# Patient Record
Sex: Female | Born: 1970 | Race: White | Hispanic: No | Marital: Married | Smoking: Former smoker
Health system: Southern US, Community
[De-identification: ages and names within clinical notes are randomized; demographics above are authoritative.]

## PROBLEM LIST (undated history)

## (undated) DIAGNOSIS — E739 Lactose intolerance, unspecified: Secondary | ICD-10-CM

## (undated) DIAGNOSIS — T7840XA Allergy, unspecified, initial encounter: Secondary | ICD-10-CM

## (undated) DIAGNOSIS — F32A Depression, unspecified: Secondary | ICD-10-CM

## (undated) DIAGNOSIS — G43909 Migraine, unspecified, not intractable, without status migrainosus: Secondary | ICD-10-CM

## (undated) DIAGNOSIS — Z9889 Other specified postprocedural states: Secondary | ICD-10-CM

## (undated) DIAGNOSIS — R002 Palpitations: Secondary | ICD-10-CM

## (undated) DIAGNOSIS — M549 Dorsalgia, unspecified: Secondary | ICD-10-CM

## (undated) DIAGNOSIS — N979 Female infertility, unspecified: Secondary | ICD-10-CM

## (undated) DIAGNOSIS — E282 Polycystic ovarian syndrome: Secondary | ICD-10-CM

## (undated) DIAGNOSIS — F419 Anxiety disorder, unspecified: Secondary | ICD-10-CM

## (undated) DIAGNOSIS — F329 Major depressive disorder, single episode, unspecified: Secondary | ICD-10-CM

## (undated) DIAGNOSIS — K219 Gastro-esophageal reflux disease without esophagitis: Secondary | ICD-10-CM

## (undated) HISTORY — DX: Anxiety disorder, unspecified: F41.9

## (undated) HISTORY — DX: Major depressive disorder, single episode, unspecified: F32.9

## (undated) HISTORY — DX: Migraine, unspecified, not intractable, without status migrainosus: G43.909

## (undated) HISTORY — DX: Female infertility, unspecified: N97.9

## (undated) HISTORY — DX: Lactose intolerance, unspecified: E73.9

## (undated) HISTORY — DX: Polycystic ovarian syndrome: E28.2

## (undated) HISTORY — DX: Depression, unspecified: F32.A

## (undated) HISTORY — DX: Palpitations: R00.2

## (undated) HISTORY — DX: Allergy, unspecified, initial encounter: T78.40XA

## (undated) HISTORY — DX: Dorsalgia, unspecified: M54.9

---

## 2007-05-31 ENCOUNTER — Inpatient Hospital Stay (HOSPITAL_COMMUNITY): Admission: RE | Admit: 2007-05-31 | Discharge: 2007-06-03 | Payer: Self-pay | Admitting: Obstetrics & Gynecology

## 2007-05-31 ENCOUNTER — Encounter (INDEPENDENT_AMBULATORY_CARE_PROVIDER_SITE_OTHER): Payer: Self-pay | Admitting: Obstetrics

## 2009-05-18 ENCOUNTER — Inpatient Hospital Stay (HOSPITAL_COMMUNITY): Admission: AD | Admit: 2009-05-18 | Discharge: 2009-05-22 | Payer: Self-pay | Admitting: Obstetrics

## 2010-08-23 LAB — RPR: RPR Ser Ql: NONREACTIVE

## 2010-08-23 LAB — CBC
HCT: 37.1 % (ref 36.0–46.0)
MCHC: 34.1 g/dL (ref 30.0–36.0)
Platelets: 204 10*3/uL (ref 150–400)
RBC: 3.24 MIL/uL — ABNORMAL LOW (ref 3.87–5.11)
RDW: 13.8 % (ref 11.5–15.5)
WBC: 10.3 10*3/uL (ref 4.0–10.5)

## 2010-10-05 NOTE — Op Note (Signed)
NAMECHERITH, Aimee Gray              ACCOUNT NO.:  [id number]   MEDICAL RECORD NO.:  [id number]          PATIENT TYPE:  INP   LOCATION:  9174                          FACILITY:  [facility]   PHYSICIAN:  Lendon Colonel, MD   DATE OF BIRTH:  01/01/1971   DATE OF PROCEDURE:  05/31/2007  DATE OF DISCHARGE:                               OPERATIVE REPORT   PREOPERATIVE DIAGNOSIS:  Term IUP, fetal distress.   POSTOPERATIVE DIAGNOSIS:  Term IUP, fetal distress.   PROCEDURE:  Primary low transverse cesarean section.   SURGEON:  Lendon Colonel, M.D.   ASSISTANT:  None.   ANESTHESIA:  Epidural.   SPECIMENS:  Placenta to pathology.   ESTIMATED BLOOD LOSS:  900 mL.   COMPLICATIONS:  None.   FINDINGS:  Female infant with a spontaneous cry.  Normal tubes and  ovaries.  Normal uterus.  Normal placenta.  Cord gas 7.2.   INDICATIONS FOR PROCEDURE:  This is a 40 year old gravida 1, admitted  for induction of labor due to maternal discomfort.  The patient had  misoprostol and artificial rupture of membranes.  At about 4-5 cm the  patient became increasingly uncomfortable and asked for Stadol followed  by epidural.  Fetal heart rate tracing was initially in the 120s and the  antenatal course fetal heart tracing was in the 140s.  Over the last 30  minutes of labor, fetal heart rate had dropped into the 110s and in the  last 15 minutes had dropped into the 100s.  After epidural the fetal  heart rate stayed in the 100s with several decelerations into the mid  80s.  Terbutaline was given with some decrease in the fetal heart rate  to the 110s.  However, on the next contraction, another late  deceleration was noted and the decision was made to proceed to the  operating room for a primary low transverse cesarean section.  Risks and  benefits of cesarean section including bleeding, infection, damage to  internal organs were discussed with the patient and also increased risk  for either a trial of labor  post cesarean section or repeat cesarean  section were briefly discussed with the patient.  The patient agreed to  proceed.   DESCRIPTION OF PROCEDURE:  After informed consent was obtained, the  patient was taken to the operating room where general anesthesia was  found to be adequate after manipulation of the epidural catheter.  The  patient had been changed and Foley catheter was in place from the labor  room.  A Pfannenstiel skin incision was made 2 cm above the pubic  symphysis with scalpel carrying through to the underlying layer of  fascia with the scalpel.  The fascia was incised in the midline with the  Bovie and the incision was extended laterally with the Mayo scissors.  The inferior aspect of the fascial incision was grasped with Kocher  clamp, elevated up, and the underlying rectus muscles were dissected off  sharply.  Attention was turned to the superior aspect of the fascial  incision which in a similar fashion was grasped with Kocher clamps,  elevated up, and the underlying rectus muscles were dissected off  sharply.  The rectus muscles were separated bluntly in the midline.  The  pyramidalis muscles were cut in the midline.  The peritoneum was  identified, entered bluntly.  The peritoneal incision was extended  superiorly and inferiorly with good visualization of the bladder.  The  bladder blade was inserted.  The vesicouterine peritoneum was  identified, grasped with the pickups, and entered sharply with the  Metzenbaum scissors.  The incision was extended laterally and the  bladder flap was created digitally.  The bladder blade was reinserted.  The lower uterine segment was incised in a transverse fashion with the  scalpel.  No bulging membranes were noted once in the uterine cavity.  Bandage scissors were used to extend the incision.  The infant's occiput  was grasped, brought to the incision, and delivered.  A nuchal cord x1  was noted and was reduced without  complications.  The remainder of the  infant was delivered after being bulb suctioned.  The cord was clamped  and cut and the infant was handed off to the awaiting pediatrician.  Cord gas was sent.  Placenta was expressed and sent to pathology after  cord blood.   The uterus was exteriorized, cleared of all clots and debris.  The  uterine incision was repaired with 0 Vicryl in a running locking  fashion.  A second layer suture was used in an imbricating fashion for  excellent hemostasis.  Once hemostatic, the uterus was returned to the  abdomen and gutters were cleared of all clots and debris.  The uterine  incision was reinspected and found to be hemostatic.  The peritoneum was  closed with 2-0 Vicryl in a running stitch.  The cut muscle edges and  underside of the fascia were inspected, found to be hemostatic, and the  fascia was closed with 0 Vicryl in a running locked fashion and  subcutaneous tissue was irrigated.  No capillary bleeders were noted and  the skin was closed with staples.  The patient tolerated the procedure  well.  Needle, sponge, and instrument counts correct x3.  The patient  was taken to the recovery room in stable condition.      Lendon Colonel, MD  Electronically Signed     KAF/MEDQ  D:  05/31/2007  T:  05/31/2007  Job:  [id number]

## 2010-10-08 NOTE — Discharge Summary (Signed)
Aimee Gray, Aimee Gray              ACCOUNT NO.:  [id number]   MEDICAL RECORD NO.:  [id number]          PATIENT TYPE:  INP   LOCATION:  9146                          FACILITY:  [facility]   PHYSICIAN:  Lendon Colonel, MD   DATE OF BIRTH:  02/27/1971   DATE OF ADMISSION:  05/31/2007  DATE OF DISCHARGE:  06/03/2007                               DISCHARGE SUMMARY   CHIEF COMPLAINT:  Induction of labor for maternal discomfort/maternal  request.   HISTORY OF PRESENT ILLNESS:  This is a 40 year old G2, P0-0-1-0,  admitted at 40 weeks and 2 days for labor induction.  The patient had  good fetal movement, no vaginal bleeding, no leakage of fluid, and a few  contractions on admission.  The patient's prenatal care has been at  [facility].  Her prenatal course has been significant for history  of infertility.   MEDICATIONS:  She is on prenatal vitamins.   ALLERGIES:  She has no known drug allergies.   The remainder of past medical, surgical, obstetrical, and gynecologic  history as per her admission H&P.   PHYSICAL EXAMINATION:  She was afebrile with stable vital signs upon  admission.  ABDOMEN:  Gravid and nontender.  CERVIX:  Her cervical exam showed her to be 1-2 cm dilated, 50% effaced  with a fetal vertex and a -3 position.  The fetal heart rate was in the  baseline of 140s with moderate variability and no deceleration.  She was  having rare contractions.   ASSESSMENT/PLAN:  This is an induction at 40+ weeks per maternal  request, desiring minimal interventions and intermittent fetal  monitoring.   HOSPITAL COURSE:  The patient was admitted with plan as above.  The  patient was admitted at 9 a.m.  By 1 p.m. that same day the patient was  continuing with an unmedicated childbirth with the help of a doula.  She  was afebrile.  She was contracting q.2-3 m.  Her cervix was 2-3 cm  dilated, 90% effaced with a vertex and a -2 position.  She was AROM for  clear fluid at that time.  At 1  p.m. the fetal baseline had dropped from  originally at 140 to a baseline of 125 but remained reactive and the  patient was receiving intermittent fetal monitoring.  By 3:35 the  patient was very uncomfortable with her contractions.  She was asking  for an epidural.  Her blood pressure was 130/80.  She was contracting  q.2-4 minutes and the fetal heart tracing was in the 110s with early and  variable decelerations to the 100s with good recovery and still with  spontaneous accelerations. She had progressed to approximately 4-5 cm.  She received her epidural and over the pursuing 30 minutes the fetal  baseline dropped to the 100s with variable and late decelerations into  the 80s.  The decision was made at that time given that we were remote  from delivery  to proceed with a primary low transverse cesarean section  that can be found in a separate dictation.  Postoperatively, the patient  did well and by  postoperative day #3 the patient was afebrile with  stable vital signs, was ambulating, voiding, and having adequate pain  control.  She was discharged to home with Colace, Motrin, and Percocet.   DISPOSITION:  To home.   CONDITION ON DISCHARGE:  Stable.   DISCHARGE INSTRUCTIONS:  Call for fevers, increased vaginal bleeding,  whitish vaginal discharge or abdominal pain.   DISCHARGE DIAGNOSES:  1. Status post failed induction  2. Fetal intolerance to labor.  3. Status post primary low transverse cesarean section.      Lendon Colonel, MD  Electronically Signed     KAF/MEDQ  D:  06/29/2007  T:  07/01/2007  Job:  [id number]

## 2011-02-10 LAB — CBC
Hemoglobin: 10 — ABNORMAL LOW
Hemoglobin: 12.8
MCHC: 34.3
MCV: 91.9
RBC: 3.11 — ABNORMAL LOW
RBC: 3.95
WBC: 10.6 — ABNORMAL HIGH

## 2011-02-10 LAB — RPR: RPR Ser Ql: NONREACTIVE

## 2011-05-28 ENCOUNTER — Ambulatory Visit (INDEPENDENT_AMBULATORY_CARE_PROVIDER_SITE_OTHER): Payer: BC Managed Care – PPO

## 2011-05-28 DIAGNOSIS — J0100 Acute maxillary sinusitis, unspecified: Secondary | ICD-10-CM

## 2011-05-28 DIAGNOSIS — R4589 Other symptoms and signs involving emotional state: Secondary | ICD-10-CM

## 2011-06-21 ENCOUNTER — Ambulatory Visit (INDEPENDENT_AMBULATORY_CARE_PROVIDER_SITE_OTHER): Payer: BC Managed Care – PPO | Admitting: Family Medicine

## 2011-06-21 VITALS — BP 96/60 | HR 74 | Temp 98.0°F | Resp 18 | Ht 60.75 in | Wt 146.6 lb

## 2011-06-21 DIAGNOSIS — R05 Cough: Secondary | ICD-10-CM

## 2011-06-21 DIAGNOSIS — G4700 Insomnia, unspecified: Secondary | ICD-10-CM

## 2011-06-21 DIAGNOSIS — J40 Bronchitis, not specified as acute or chronic: Secondary | ICD-10-CM

## 2011-06-21 MED ORDER — ALPRAZOLAM 0.25 MG PO TABS: 0.2500 mg | ORAL_TABLET | Freq: Every evening | ORAL | Status: AC | PRN

## 2011-06-21 MED ORDER — HYDROCODONE-HOMATROPINE 5-1.5 MG/5ML PO SYRP: 5.0000 mL | ORAL_SOLUTION | Freq: Three times a day (TID) | ORAL | Status: AC | PRN

## 2011-06-21 MED ORDER — PREDNISONE 20 MG PO TABS: 40.0000 mg | ORAL_TABLET | Freq: Every day | ORAL | Status: AC

## 2011-06-21 NOTE — Progress Notes (Signed)
  Subjective:    Patient ID: Aimee Gray, female    DOB: May 18, 1971, 41 y.o.   MRN: [id number]  URI  This is a chronic problem. The current episode started more than 1 month ago. The problem has been unchanged. There has been no fever. Associated symptoms include chest pain, congestion, coughing, headaches, rhinorrhea, sinus pain and a sore throat. Pertinent negatives include no diarrhea, ear pain, nausea, vomiting or wheezing. She has tried decongestant for the symptoms. The treatment provided mild relief.  seen 11/28 - bronchitis.  Omnicef.  Resolved. Seen 05/28/11 - sinusitis.  Amox, flonase, zofran.  GI symptoms resolved others not.  Also, has trouble sleeping.  Son with feeding tube.  Has to be fed twice a night.  Takes her at least an hour to fall back to sleep.  Was suggested at last visit she talk to her pcp and consider xanax.  PCP declined and rec'd she look into night nursing care for him.  Not financially feasible for them.  Tylenol PM knocks her out too much.     Review of Systems  HENT: Positive for congestion, sore throat and rhinorrhea. Negative for ear pain.   Respiratory: Positive for cough. Negative for wheezing.   Cardiovascular: Positive for chest pain.  Gastrointestinal: Negative for nausea, vomiting and diarrhea.  Neurological: Positive for headaches.       Objective:   Physical Exam  Constitutional: She appears well-developed. No distress.  HENT:  Right Ear: Tympanic membrane, external ear and ear canal normal. Tympanic membrane is not injected, not scarred, not perforated, not erythematous, not retracted and not bulging.  Left Ear: Tympanic membrane, external ear and ear canal normal. Tympanic membrane is not injected, not scarred, not perforated, not erythematous, not retracted and not bulging.  Nose: Mucosal edema and rhinorrhea present. Right sinus exhibits maxillary sinus tenderness. Right sinus exhibits no frontal sinus tenderness. Left sinus exhibits  maxillary sinus tenderness. Left sinus exhibits no frontal sinus tenderness.  Mouth/Throat: Uvula is midline, oropharynx is clear and moist and mucous membranes are normal. No oropharyngeal exudate or tonsillar abscesses.  Cardiovascular: Normal rate, regular rhythm, normal heart sounds and intact distal pulses.   No murmur heard. Pulmonary/Chest: Effort normal and breath sounds normal. No respiratory distress. She has no wheezes. She has no rales.  Lymphadenopathy:       Head (right side): No submandibular and no preauricular adenopathy present.       Head (left side): No submandibular and no preauricular adenopathy present.       Right cervical: No superficial cervical and no posterior cervical adenopathy present.      Left cervical: No superficial cervical and no posterior cervical adenopathy present.       Right: No supraclavicular adenopathy present.       Left: No supraclavicular adenopathy present.  Skin: Skin is warm and dry.          Assessment & Plan:  Persistent sinusitis/bronchitis Prednisone.  See Rx for dosing. Continue flonase. Hycodan  Insomnia  Xanax.  See Rx for dosing.

## 2011-06-21 NOTE — Patient Instructions (Addendum)
Thank you for coming in today.  You saw Dr. Ardeen Garland, MD.  I hope you feel better quickly.

## 2014-03-03 ENCOUNTER — Ambulatory Visit (INDEPENDENT_AMBULATORY_CARE_PROVIDER_SITE_OTHER): Payer: BC Managed Care – PPO | Admitting: Physician Assistant

## 2014-03-03 VITALS — BP 102/68 | HR 79 | Temp 98.1°F | Resp 16 | Ht 60.5 in | Wt 146.0 lb

## 2014-03-03 DIAGNOSIS — J020 Streptococcal pharyngitis: Secondary | ICD-10-CM

## 2014-03-03 DIAGNOSIS — J029 Acute pharyngitis, unspecified: Secondary | ICD-10-CM

## 2014-03-03 MED ORDER — AMOXICILLIN 875 MG PO TABS: 875.0000 mg | ORAL_TABLET | Freq: Two times a day (BID) | ORAL | Status: AC

## 2014-03-03 NOTE — Progress Notes (Signed)
     IDENTIFYING INFORMATION  Aimee ShipperShelley J Kemmerling / female / 04/25/1971 / 43 y.o. / MRN: [id number]  The patient  does not have a problem list on file.  SUBJECTIVE  Chief Complaint: had a chief complaint of Sore Throat and an additional complaint of Chills.  History of present illness:  Started Wed. night with a sore throat. She reports her husband was diagnosed with strep throat via a rapid strep in this office last week.   She has pain with swallowing (4/10), however is able to eat and drink normally. She reports having chills over the weekend, along with fatigue and malaise.  She denies congestion, cough, and reports that her neck is tender to the touch.  She denies fever, but has been taking Tylenol at regular intervals to control pain.    The patient has a current medication list which includes the following prescription(s): bupropion, escitalopram.  She has No Known Allergies.  The patient has  has past surgical history that includes Cesarean section.  The patient has  reports that she has quit smoking. She does not have any smokeless tobacco history on file.  The patient has family history includes Cancer in her maternal grandmother; Diabetes in her father and maternal grandfather; Heart disease in her father, mother, paternal grandfather, and paternal grandmother; Hyperlipidemia in her father; Hypertension in her father, mother, and paternal grandmother; Stroke in her maternal grandfather.  Review of Systems  Constitutional: Positive for chills, malaise/fatigue and diaphoresis (night sweats on 03/01/14). Negative for fever and weight loss.  HENT: Negative.  Negative for congestion and ear discharge.   Eyes: Negative.   Respiratory: Negative for cough.   Cardiovascular: Negative.   Gastrointestinal: Negative for nausea, vomiting and abdominal pain.  Genitourinary: Negative.   Musculoskeletal: Negative for myalgias.  Skin: Negative for itching and rash.  Neurological: Negative.   Negative for weakness.    OBJECTIVE  Blood pressure 102/68, pulse 79, temperature 98.1 F (36.7 C), temperature source Oral, resp. rate 16, height 5' 0.5" (1.537 m), weight 146 lb (66.225 kg), SpO2 99.00%.  Centor Criteria: - cough, + for cervical lympadenopathy, + for tonisillar exudates, - for fever.  Physical Exam  Constitutional: She is oriented to person, place, and time and well-developed, well-nourished, and in no distress.  HENT:  Head: Normocephalic.  Right Ear: External ear normal.  Left Ear: External ear normal.  Mouth/Throat: Uvula is midline and mucous membranes are normal.    Cardiovascular: Normal rate, regular rhythm and normal heart sounds.   Pulmonary/Chest: Effort normal and breath sounds normal. She has no wheezes.  Musculoskeletal: Normal range of motion.  Neurological: She is alert and oriented to person, place, and time.  Skin: Skin is warm and dry. She is not diaphoretic.  Psychiatric: Mood, memory, affect and judgment normal.    ASSESSMENT & PLAN  Streptococcal sore throat - Plan: amoxicillin (AMOXIL) 875 MG tablet.  Advised patient to get plenty of rest and fluids.    Sore throat - Plan: CANCELED: POCT rapid strep A. Cancelled secondary to the fact that her husband was diagnosed via rapid strep test in this office.  The patient was instructed to to call or comeback to clinic as needed, or should symptoms warrant.  Deliah BostonMichael Clark, MS, PA-C [facility] 03/03/2014 11:32 AM

## 2014-03-03 NOTE — Patient Instructions (Signed)

## 2014-03-04 NOTE — Progress Notes (Signed)
I have examined this patient along with Mr. Clark, PA-C and agree.  

## 2014-10-14 ENCOUNTER — Ambulatory Visit (INDEPENDENT_AMBULATORY_CARE_PROVIDER_SITE_OTHER): Payer: BLUE CROSS/BLUE SHIELD | Admitting: Physician Assistant

## 2014-10-14 VITALS — BP 104/70 | HR 91 | Temp 98.2°F | Resp 18 | Ht 60.05 in | Wt 147.0 lb

## 2014-10-14 DIAGNOSIS — F411 Generalized anxiety disorder: Secondary | ICD-10-CM | POA: Diagnosis not present

## 2014-10-14 DIAGNOSIS — R350 Frequency of micturition: Secondary | ICD-10-CM

## 2014-10-14 DIAGNOSIS — F329 Major depressive disorder, single episode, unspecified: Secondary | ICD-10-CM

## 2014-10-14 DIAGNOSIS — M545 Low back pain, unspecified: Secondary | ICD-10-CM

## 2014-10-14 DIAGNOSIS — Z8619 Personal history of other infectious and parasitic diseases: Secondary | ICD-10-CM

## 2014-10-14 DIAGNOSIS — N390 Urinary tract infection, site not specified: Secondary | ICD-10-CM

## 2014-10-14 DIAGNOSIS — F32A Depression, unspecified: Secondary | ICD-10-CM

## 2014-10-14 LAB — POCT URINALYSIS DIPSTICK
BILIRUBIN UA: NEGATIVE
Glucose, UA: NEGATIVE
Ketones, UA: NEGATIVE
NITRITE UA: NEGATIVE
PROTEIN UA: NEGATIVE
SPEC GRAV UA: 1.01
UROBILINOGEN UA: 0.2
pH, UA: 7

## 2014-10-14 LAB — POCT UA - MICROSCOPIC ONLY
CASTS, UR, LPF, POC: NEGATIVE
CRYSTALS, UR, HPF, POC: NEGATIVE
MUCUS UA: NEGATIVE
YEAST UA: NEGATIVE

## 2014-10-14 MED ORDER — NITROFURANTOIN MONOHYD MACRO 100 MG PO CAPS: 100.0000 mg | ORAL_CAPSULE | Freq: Two times a day (BID) | ORAL | Status: DC

## 2014-10-14 MED ORDER — FLUCONAZOLE 150 MG PO TABS: 150.0000 mg | ORAL_TABLET | Freq: Once | ORAL | Status: DC

## 2014-10-14 NOTE — Progress Notes (Signed)
Subjective:    Patient ID: Aimee Gray, female    DOB: May 09, 1971, 44 y.o.   MRN: [id number]  Chief Complaint  Patient presents with  . Urinary Frequency    x 4 days  . Back Pain    lower back pain  . Chills  . Depression    triage screening   Patient Active Problem List   Diagnosis Date Noted  . Depression 10/14/2014  . Generalized anxiety disorder 10/14/2014   Medications, allergies, past medical history, surgical history, family history, social history and problem list reviewed and updated.  HPI  46 yof presents with 4 day h/o urinary freq, pressure, and urgency. No burning. No pain. Denies vaginal dc, odor, itchy, dyspareunia. Has been having chills yest and today but no fevers. Denies n/v.   She also mentions low back pain. For past 2-3 days. Bilateral across back. Rated 3/10 and fairly constant past couple days. No radiating pain. No loss bowel/bladder dysfx. No perianal los. No trauma. No new activities. Denies leg weakness or numbness.   She scored positive on phq 2 screening and scored 17 on ensuing phq 9. States has hx depression/gad treated by her midwife. Been on medication for years. Has hx cycling off meds when feeling better and has done this recently. Stopped taking her lexapro several wks ago and takes wellbutrin sporadically. She is planning to follow with her midwife in the near future to further discuss her depression. Denies SI/HI. Her stressors include a 2nd son with significant medical issues, medical bills, and recently being turned down for a job.   Review of Systems See HPI.     Objective:   Physical Exam  Constitutional: She appears well-developed and well-nourished.  Non-toxic appearance. She does not have a sickly appearance. She does not appear ill. No distress.  BP 104/70 mmHg  Pulse 91  Temp(Src) 98.2 F (36.8 C) (Oral)  Resp 18  Ht 5' 0.05" (1.525 m)  Wt 147 lb (66.679 kg)  BMI 28.67 kg/m2  SpO2 99%   Abdominal: There is tenderness  in the suprapubic area. There is no rigidity, no rebound, no guarding, no CVA tenderness, no tenderness at McBurney's point and negative Murphy's sign.  Musculoskeletal:       Lumbar back: Normal. She exhibits normal range of motion, no tenderness, no bony tenderness, no pain and no spasm.  Neurological:  Negative straight leg raise bilaterally.     Results for orders placed or performed in visit on 10/14/14  POCT UA - Microscopic Only  Result Value Ref Range   WBC, Ur, HPF, POC 15-20    RBC, urine, microscopic 0-2    Bacteria, U Microscopic trace    Mucus, UA neg    Epithelial cells, urine per micros 10-15    Crystals, Ur, HPF, POC neg    Casts, Ur, LPF, POC neg    Yeast, UA neg   POCT urinalysis dipstick  Result Value Ref Range   Color, UA yellow    Clarity, UA cloudy    Glucose, UA neg    Bilirubin, UA neg    Ketones, UA neg    Spec Grav, UA 1.010    Blood, UA small    pH, UA 7.0    Protein, UA neg    Urobilinogen, UA 0.2    Nitrite, UA neg    Leukocytes, UA small (1+)    Depression screen [facility] 2/9 10/14/2014  Decreased Interest 3  Down, Depressed, Hopeless 3  PHQ -  2 Score 6  Altered sleeping 1  Tired, decreased energy 2  Change in appetite 2  Feeling bad or failure about yourself  1  Trouble concentrating 3  Moving slowly or fidgety/restless 2  Suicidal thoughts 0  PHQ-9 Score 17  Difficult doing work/chores Somewhat difficult       Assessment & Plan:   Frequency of urination - Plan: POCT UA - Microscopic Only, POCT urinalysis dipstick, POCT UA - Microscopic Only, POCT urinalysis dipstick, Urine culture Urinary tract infection without hematuria, site unspecified - Plan: fluconazole (DIFLUCAN) 150 MG tablet --leuks, small amnt blood on ua, culture sent --macrobid --doubt pyelo -- normal vitals today, no casts on ua, no fever, no n/v, no cva tenderness  Bilateral low back pain without sciatica --likely strain --> neg cva tenderness, neg straight leg raise,  no concerning sx for compression --rest, ice, nsaids  History of fungal infection - Plan: fluconazole (DIFLUCAN) 150 MG tablet --with antibiotic usage  Depression Generalized anxiety disorder --follows with midwife, planning to see them soon, no SI/HI today  Aimee Lopesodd M. Lealand Elting, PA-C Physician Assistant-Certified [facility]  10/14/2014 10:34 AM

## 2014-10-14 NOTE — Patient Instructions (Signed)
You have a UTI.  Please take the macrobid twice daily for 5 days. Please take the diflucan if needed for a yeast infection. Please come back to see us if you start having fevers, persistent chills, worsening back pain or nausea and vomiting.  Please be sure to follow with your midwife for mood.

## 2014-10-15 ENCOUNTER — Telehealth: Payer: Self-pay

## 2014-10-15 DIAGNOSIS — N3000 Acute cystitis without hematuria: Secondary | ICD-10-CM

## 2014-10-15 MED ORDER — SULFAMETHOXAZOLE-TRIMETHOPRIM 800-160 MG PO TABS: 1.0000 | ORAL_TABLET | Freq: Two times a day (BID) | ORAL | Status: DC

## 2014-10-15 NOTE — Telephone Encounter (Signed)
She should stop taking the macrobid. I have sent in a different antibiotic, bactrim, which she should take twice daily for 5 days. If her symptoms persist despite changing antibiotics she needs to come back in to be seen.

## 2014-10-15 NOTE — Telephone Encounter (Signed)
Spoke with pt, gave message from [place]. Pt understood.

## 2014-10-15 NOTE — Telephone Encounter (Signed)
nitrofurantoin, macrocrystal-monohydrate, (MACROBID) 100 MG capsule [[id number]][[id number]]     Order Details

## 2014-10-15 NOTE — Telephone Encounter (Signed)
Patient is calling because she was seen yesterday and is having a reaction to the antibiotic she was prescribed. She states she is having achy joints, stomach sickness, and a bad headache and wants to know if she should keep taking the medication. Please call and advise! [phone]

## 2014-10-16 LAB — URINE CULTURE

## 2015-04-03 ENCOUNTER — Ambulatory Visit (INDEPENDENT_AMBULATORY_CARE_PROVIDER_SITE_OTHER): Payer: BC Managed Care – PPO | Admitting: Family Medicine

## 2015-04-03 VITALS — BP 112/70 | HR 88 | Temp 98.5°F | Resp 18 | Ht 60.0 in | Wt 136.0 lb

## 2015-04-03 DIAGNOSIS — J0111 Acute recurrent frontal sinusitis: Secondary | ICD-10-CM

## 2015-04-03 DIAGNOSIS — Z23 Encounter for immunization: Secondary | ICD-10-CM

## 2015-04-03 MED ORDER — AMOXICILLIN 500 MG PO CAPS: 1000.0000 mg | ORAL_CAPSULE | Freq: Two times a day (BID) | ORAL | Status: DC

## 2015-04-03 NOTE — Progress Notes (Signed)
[facility] [address], [place] [address] [phone]  Date:  04/03/2015   Name:  Aimee Gray   DOB:  [birth year]   MRN:  [id number]  PCP:  No primary care provider on file.    Chief Complaint: Sinusitis and Immunizations   History of Present Illness:  Aimee Gray is a 44 y.o. 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 Depression 10/14/2014  . Generalized anxiety disorder 10/14/2014    Past Medical History  Diagnosis Date  . Allergy   . Anxiety   . Depression     Past Surgical History  Procedure Laterality Date  . Cesarean section      Social History  Substance Use Topics  . Smoking status: Former Games developer  . Smokeless tobacco: None     Comment: quit 5 years ago  . Alcohol Use: None    Family History  Problem Relation Age of Onset  . Heart disease Mother   . Hypertension Mother   . Diabetes Father   . Heart disease Father   . Hyperlipidemia Father   . Hypertension Father   . Cancer Maternal Grandmother   . Diabetes Maternal Grandfather   . Stroke Maternal Grandfather   . Heart disease Paternal Grandmother   . Hypertension Paternal Grandmother   . Heart disease Paternal Grandfather     No Known Allergies  Medication list has been reviewed and updated.  Current Outpatient Prescriptions on File Prior to Visit  Medication Sig  Dispense Refill  . buPROPion (WELLBUTRIN XL) 150 MG 24 hr tablet Take 150 mg by mouth daily.    Marland Kitchen escitalopram (LEXAPRO) 10 MG tablet Take 10 mg by mouth daily.    . fluconazole (DIFLUCAN) 150 MG tablet Take 1 tablet (150 mg total) by mouth once. Repeat if needed (Patient not taking: Reported on 04/03/2015) 2 tablet 0  . nitrofurantoin, macrocrystal-monohydrate, (MACROBID) 100 MG capsule Take 1 capsule (100 mg total) by mouth 2 (two) times daily. (Patient not taking: Reported on 04/03/2015) 10 capsule 0  . sulfamethoxazole-trimethoprim (BACTRIM DS,SEPTRA DS) 800-160 MG per tablet Take 1 tablet by mouth 2 (two) times daily. (Patient not taking: Reported on 04/03/2015) 10 tablet 0   No current facility-administered medications on file prior to visit.    Review of Systems:  As per HPI- otherwise negative.   Physical Examination: Filed Vitals:   04/03/15 1042  BP: 112/70  Pulse: 88  Temp: 98.5 F (36.9 C)  Resp: 18   Filed Vitals:   04/03/15 1042  Height: 5' (1.524 m)  Weight: 136 lb (61.689 kg)   Body mass index is 26.56 kg/(m^2). Ideal Body Weight: Weight in (lb) to have BMI = 25: 127.7  GEN: WDWN, NAD, Non-toxic, A & O x 3, overweight, looks well HEENT:  Atraumatic, Normocephalic. Neck supple. No masses, No LAD.  Bilateral TM wnl, oropharynx normal.  PEERL,EOMI.   Nasal cavity is normal  Ears and Nose: No external deformity. CV: RRR, No M/G/R. No JVD. No thrill. No extra heart sounds. PULM: CTA B, no wheezes, crackles, rhonchi. No retractions. No resp. distress. No accessory muscle use. EXTR: No c/c/e NEURO Normal gait.  PSYCH: Normally interactive. Conversant. Not depressed or anxious appearing.  Calm demeanor.    Assessment and Plan: Acute recurrent frontal sinusitis - Plan: amoxicillin (AMOXIL) 500 MG capsule  Immunization due - Plan: Flu Vaccine QUAD 36+ mos IM  Treat for a sinus infection with amoxicillin. She would like to go ahead with a flu shot today which is  fine - offered to do another day.  She is just mildly ill, no fever so ok to give today as is her preference   Signed Abbe AmsterdamJessica Copland, MD

## 2015-04-03 NOTE — Patient Instructions (Signed)
We are going to treat you for a sinus infection with amoxicillin Take it as directed for 7- 10 days Let ms know if you are not feeling better soon- Sooner if worse.   Ok to continue OTC medications as needed for your symptoms

## 2015-04-22 ENCOUNTER — Ambulatory Visit (INDEPENDENT_AMBULATORY_CARE_PROVIDER_SITE_OTHER): Payer: BC Managed Care – PPO | Admitting: Family Medicine

## 2015-04-22 VITALS — BP 108/60 | HR 97 | Temp 97.9°F | Resp 18 | Ht 61.0 in | Wt 135.0 lb

## 2015-04-22 DIAGNOSIS — J0101 Acute recurrent maxillary sinusitis: Secondary | ICD-10-CM | POA: Diagnosis not present

## 2015-04-22 DIAGNOSIS — G43101 Migraine with aura, not intractable, with status migrainosus: Secondary | ICD-10-CM | POA: Diagnosis not present

## 2015-04-22 MED ORDER — AMOXICILLIN-POT CLAVULANATE 875-125 MG PO TABS: 1.0000 | ORAL_TABLET | Freq: Two times a day (BID) | ORAL | Status: DC

## 2015-04-22 MED ORDER — RIZATRIPTAN BENZOATE 10 MG PO TBDP: 10.0000 mg | ORAL_TABLET | ORAL | Status: DC | PRN

## 2015-04-22 NOTE — Patient Instructions (Signed)
Migraine Headache °A migraine headache is an intense, throbbing pain on one or both sides of your head. A migraine can last for 30 minutes to several hours. °CAUSES  °The exact cause of a migraine headache is not always known. However, a migraine may be caused when nerves in the brain become irritated and release chemicals that cause inflammation. This causes pain. °Certain things may also trigger migraines, such as: °· Alcohol. °· Smoking. °· Stress. °· Menstruation. °· Aged cheeses. °· Foods or drinks that contain nitrates, glutamate, aspartame, or tyramine. °· Lack of sleep. °· Chocolate. °· Caffeine. °· Hunger. °· Physical exertion. °· Fatigue. °· Medicines used to treat chest pain (nitroglycerine), birth control pills, estrogen, and some blood pressure medicines. °SIGNS AND SYMPTOMS °· Pain on one or both sides of your head. °· Pulsating or throbbing pain. °· Severe pain that prevents daily activities. °· Pain that is aggravated by any physical activity. °· Nausea, vomiting, or both. °· Dizziness. °· Pain with exposure to bright lights, loud noises, or activity. °· General sensitivity to bright lights, loud noises, or smells. °Before you get a migraine, you may get warning signs that a migraine is coming (aura). An aura may include: °· Seeing flashing lights. °· Seeing bright spots, halos, or zigzag lines. °· Having tunnel vision or blurred vision. °· Having feelings of numbness or tingling. °· Having trouble talking. °· Having muscle weakness. °DIAGNOSIS  °A migraine headache is often diagnosed based on: °· Symptoms. °· Physical exam. °· A CT scan or MRI of your head. These imaging tests cannot diagnose migraines, but they can help rule out other causes of headaches. °TREATMENT °Medicines may be given for pain and nausea. Medicines can also be given to help prevent recurrent migraines.  °HOME CARE INSTRUCTIONS °· Only take over-the-counter or prescription medicines for pain or discomfort as directed by your  health care provider. The use of long-term narcotics is not recommended. °· Lie down in a dark, quiet room when you have a migraine. °· Keep a journal to find out what may trigger your migraine headaches. For example, write down: °· What you eat and drink. °· How much sleep you get. °· Any change to your diet or medicines. °· Limit alcohol consumption. °· Quit smoking if you smoke. °· Get 7-9 hours of sleep, or as recommended by your health care provider. °· Limit stress. °· Keep lights dim if bright lights bother you and make your migraines worse. °SEEK IMMEDIATE MEDICAL CARE IF:  °· Your migraine becomes severe. °· You have a fever. °· You have a stiff neck. °· You have vision loss. °· You have muscular weakness or loss of muscle control. °· You start losing your balance or have trouble walking. °· You feel faint or pass out. °· You have severe symptoms that are different from your first symptoms. °MAKE SURE YOU:  °· Understand these instructions. °· Will watch your condition. °· Will get help right away if you are not doing well or get worse. °  °This information is not intended to replace advice given to you by your health care provider. Make sure you discuss any questions you have with your health care provider. °  °Document Released: 05/09/2005 Document Revised: 05/30/2014 Document Reviewed: 01/14/2013 °Elsevier Interactive Patient Education ©2016 Elsevier Inc. ° °Sinusitis, Adult °Sinusitis is redness, soreness, and inflammation of the paranasal sinuses. Paranasal sinuses are air pockets within the bones of your face. They are located beneath your eyes, in the middle of your   forehead, and above your eyes. In healthy paranasal sinuses, mucus is able to drain out, and air is able to circulate through them by way of your nose. However, when your paranasal sinuses are inflamed, mucus and air can become trapped. This can allow bacteria and other germs to grow and cause infection. °Sinusitis can develop quickly and  last only a short time (acute) or continue over a long period (chronic). Sinusitis that lasts for more than 12 weeks is considered chronic. °CAUSES °Causes of sinusitis include: °· Allergies. °· Structural abnormalities, such as displacement of the cartilage that separates your nostrils (deviated septum), which can decrease the air flow through your nose and sinuses and affect sinus drainage. °· Functional abnormalities, such as when the small hairs (cilia) that line your sinuses and help remove mucus do not work properly or are not present. °SIGNS AND SYMPTOMS °Symptoms of acute and chronic sinusitis are the same. The primary symptoms are pain and pressure around the affected sinuses. Other symptoms include: °· Upper toothache. °· Earache. °· Headache. °· Bad breath. °· Decreased sense of smell and taste. °· A cough, which worsens when you are lying flat. °· Fatigue. °· Fever. °· Thick drainage from your nose, which often is green and may contain pus (purulent). °· Swelling and warmth over the affected sinuses. °DIAGNOSIS °Your health care provider will perform a physical exam. During your exam, your health care provider may perform any of the following to help determine if you have acute sinusitis or chronic sinusitis: °· Look in your nose for signs of abnormal growths in your nostrils (nasal polyps). °· Tap over the affected sinus to check for signs of infection. °· View the inside of your sinuses using an imaging device that has a light attached (endoscope). °If your health care provider suspects that you have chronic sinusitis, one or more of the following tests may be recommended: °· Allergy tests. °· Nasal culture. A sample of mucus is taken from your nose, sent to a lab, and screened for bacteria. °· Nasal cytology. A sample of mucus is taken from your nose and examined by your health care provider to determine if your sinusitis is related to an allergy. °TREATMENT °Most cases of acute sinusitis are related  to a viral infection and will resolve on their own within 10 days. Sometimes, medicines are prescribed to help relieve symptoms of both acute and chronic sinusitis. These may include pain medicines, decongestants, nasal steroid sprays, or saline sprays. °However, for sinusitis related to a bacterial infection, your health care provider will prescribe antibiotic medicines. These are medicines that will help kill the bacteria causing the infection. °Rarely, sinusitis is caused by a fungal infection. In these cases, your health care provider will prescribe antifungal medicine. °For some cases of chronic sinusitis, surgery is needed. Generally, these are cases in which sinusitis recurs more than 3 times per year, despite other treatments. °HOME CARE INSTRUCTIONS °· Drink plenty of water. Water helps thin the mucus so your sinuses can drain more easily. °· Use a humidifier. °· Inhale steam 3-4 times a day (for example, sit in the bathroom with the shower running). °· Apply a warm, moist washcloth to your face 3-4 times a day, or as directed by your health care provider. °· Use saline nasal sprays to help moisten and clean your sinuses. °· Take medicines only as directed by your health care provider. °· If you were prescribed either an antibiotic or antifungal medicine, finish it all even if you   start to feel better. °SEEK IMMEDIATE MEDICAL CARE IF: °· You have increasing pain or severe headaches. °· You have nausea, vomiting, or drowsiness. °· You have swelling around your face. °· You have vision problems. °· You have a stiff neck. °· You have difficulty breathing. °  °This information is not intended to replace advice given to you by your health care provider. Make sure you discuss any questions you have with your health care provider. °  °Document Released: 05/09/2005 Document Revised: 05/30/2014 Document Reviewed: 05/24/2011 °Elsevier Interactive Patient Education ©2016 Elsevier Inc. ° °

## 2015-04-22 NOTE — Progress Notes (Signed)
Subjective:  This chart was scribed for  [name] MD, by Veverly Fells, at [facility].  This patient was seen in room 8  and the patient's care was started at 12:52 PM.    Patient ID: Aimee Gray, female    DOB: 06/21/1970, 44 y.o.   MRN: [id number] Chief Complaint  Patient presents with  . Nasal Congestion  . Headache    x 3 days history of migraines  . Chills  . Emesis    x 2 days   . Nausea  . Medication Refill    [name]    HPI  HPI Comments: Aimee Gray is a 44 y.o. female who presents to the [facility] complaining of nasal congestion onset three days ago.  She has associated symptoms of vomiting/nausea, a severe headache and chills.  Patient notes that last night when she was sleeping, she could see flashing lights each time she closed her eyes.  Patient states that she often gets sinus infections where she feels like she is sick to her stomach and assumes she has a stomach issue.  Her son has had a stomach virus recently.   Patient drinks caffeine, she does not smoke.  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  She has a 42 year old and a 42 year old.    Patient has a history of migraines and has run out of her [name].   Denies any blood in her mucous.     Patient Active Problem List   Diagnosis Date Noted  . Depression 10/14/2014  . Generalized anxiety disorder 10/14/2014   Past Medical History  Diagnosis Date  . Allergy   . Anxiety   . Depression    Past Surgical History  Procedure Laterality Date  . Cesarean section     No Known Allergies Prior to Admission medications   Medication Sig Start Date End Date Taking? Authorizing Provider  rizatriptan ([name]) 10 MG tablet Take 10 mg by mouth as needed for migraine. May repeat in 2 hours if needed   Yes Historical Provider, MD  amoxicillin (AMOXIL) 500 MG capsule Take 2 capsules (1,000 mg total) by mouth 2 (two) times daily. Patient not  taking: Reported on 04/22/2015 04/03/15   Pearline Cables, MD   Social History   Social History  . Marital Status: Married    Spouse Name: N/A  . Number of Children: N/A  . Years of Education: N/A   Occupational History  . Not on file.   Social History Main Topics  . Smoking status: Former Games developer  . Smokeless tobacco: Not on file     Comment: quit 5 years ago  . Alcohol Use: Not on file  . Drug Use: Not on file  . Sexual Activity: Not on file   Other Topics Concern  . Not on file   Social History Narrative     Review of Systems  Constitutional: Positive for chills. Negative for fever.  HENT: Positive for congestion.   Respiratory: Negative for cough and choking.   Gastrointestinal: Positive for nausea and vomiting.  Musculoskeletal: Negative for neck pain and neck stiffness.  Neurological: Positive for headaches. Negative for syncope and speech difficulty.       Objective:   Physical Exam  Constitutional: She is oriented to person, place, and time. She appears well-developed and well-nourished. No distress.  HENT:  Head: Normocephalic and atraumatic.  Swollen right nasal passage  Eyes: Pupils are equal, round, and reactive to light.  Neck: Normal range of motion. Neck supple.  Pulmonary/Chest: Effort normal. No respiratory distress.  Neurological: She is alert and oriented to person, place, and time.  Skin: Skin is warm and dry.  Psychiatric: Her behavior is normal.  Patient was crying when I entered the room. She did good eye contact and seemed to cheer up a little bit after describing what sounds like a severe headache and facial pain.   Tender right Filed Vitals:   04/22/15 1244  BP: 108/60  Pulse: 97  Temp: 97.9 F (36.6 C)  TempSrc: Oral  Resp: 18  Height: 5\' 1"  (1.549 m)  Weight: 135 lb (61.236 kg)  SpO2: 98%   Patient tearful    Assessment & Plan:   This chart was scribed in my presence and reviewed by me personally.    ICD-9-CM  ICD-10-CM   1. Acute recurrent maxillary sinusitis 461.0 J01.01 amoxicillin-clavulanate (AUGMENTIN) 875-125 MG tablet  2. Migraine with aura and with status migrainosus, not intractable 346.03 G43.101 rizatriptan ([name]-MLT) 10 MG disintegrating tablet     Signed, Elvina SidleKurt Dorethia Jeanmarie, MD

## 2015-06-24 ENCOUNTER — Ambulatory Visit (INDEPENDENT_AMBULATORY_CARE_PROVIDER_SITE_OTHER): Payer: BC Managed Care – PPO | Admitting: Physician Assistant

## 2015-06-24 VITALS — BP 124/78 | HR 100 | Temp 97.8°F | Resp 20 | Ht 61.0 in | Wt 138.8 lb

## 2015-06-24 DIAGNOSIS — J069 Acute upper respiratory infection, unspecified: Secondary | ICD-10-CM

## 2015-06-24 DIAGNOSIS — B9789 Other viral agents as the cause of diseases classified elsewhere: Secondary | ICD-10-CM

## 2015-06-24 DIAGNOSIS — R6883 Chills (without fever): Secondary | ICD-10-CM | POA: Diagnosis not present

## 2015-06-24 LAB — POCT CBC
Granulocyte percent: 84.5 %G — AB (ref 37–80)
HCT, POC: 39 % (ref 37.7–47.9)
HEMOGLOBIN: 13.6 g/dL (ref 12.2–16.2)
LYMPH, POC: 0.6 (ref 0.6–3.4)
MCH: 30.5 pg (ref 27–31.2)
MCHC: 35 g/dL (ref 31.8–35.4)
MCV: 87.1 fL (ref 80–97)
MID (cbc): 0.7 (ref 0–0.9)
MPV: 6.5 fL (ref 0–99.8)
PLATELET COUNT, POC: 264 10*3/uL (ref 142–424)
POC Granulocyte: 7.2 — AB (ref 2–6.9)
POC LYMPH PERCENT: 7.3 %L — AB (ref 10–50)
POC MID %: 8.2 %M (ref 0–12)
RBC: 4.48 M/uL (ref 4.04–5.48)
RDW, POC: 13.7 %
WBC: 8.5 10*3/uL (ref 4.6–10.2)

## 2015-06-24 MED ORDER — HYDROCODONE-HOMATROPINE 5-1.5 MG/5ML PO SYRP: 2.5000 mL | ORAL_SOLUTION | Freq: Every evening | ORAL | Status: DC | PRN

## 2015-06-24 MED ORDER — AZITHROMYCIN 250 MG PO TABS: ORAL_TABLET | ORAL | Status: DC

## 2015-06-24 NOTE — Progress Notes (Signed)
06/25/2015 5:47 PM   DOB: Feb 01, 1971 / MRN: [id number]  SUBJECTIVE:  Aimee Gray is a 45 y.o. female presenting for cough, chills, sinus congestion, sore throat of 4 days of duration. OTC preps tried without relief. Has a history of recurrent maxillary sinusitis.    She has No Known Allergies.   She  has a past medical history of Allergy; Anxiety; and Depression.    She  reports that she has quit smoking. She does not have any smokeless tobacco history on file. She  has no sexual activity history on file. The patient  has past surgical history that includes Cesarean section.  Her family history includes Cancer in her maternal grandmother; Diabetes in her father and maternal grandfather; Heart disease in her father, mother, paternal grandfather, and paternal grandmother; Hyperlipidemia in her father; Hypertension in her father, mother, and paternal grandmother; Stroke in her maternal grandfather.  Review of Systems  Constitutional: Positive for malaise/fatigue. Negative for fever, chills and diaphoresis.  HENT: Positive for congestion and sore throat.   Respiratory: Positive for cough. Negative for hemoptysis, shortness of breath and wheezing.   Cardiovascular: Negative for chest pain.  Gastrointestinal: Negative for nausea.  Skin: Negative for rash.  Neurological: Negative for dizziness and weakness.  Endo/Heme/Allergies: Negative for polydipsia.    Problem list and medications reviewed and updated by myself where necessary, and exist elsewhere in the encounter.   OBJECTIVE:  BP 124/78 mmHg  Pulse 100  Temp(Src) 97.8 F (36.6 C) (Oral)  Resp 20  Ht  (1.549 m)  Wt 138 lb 12.8 oz (62.959 kg)  BMI 26.24 kg/m2  SpO2 98%  Physical Exam  Constitutional: She is oriented to person, place, and time.  HENT:  Right Ear: External ear normal.  Left Ear: External ear normal.  Nose: Mucosal edema present. Right sinus exhibits no maxillary sinus tenderness and no frontal sinus  tenderness. Left sinus exhibits no maxillary sinus tenderness and no frontal sinus tenderness.  Mouth/Throat: Oropharynx is clear and moist. No oropharyngeal exudate.  Eyes: Conjunctivae are normal. Pupils are equal, round, and reactive to light.  Cardiovascular: Regular rhythm and normal heart sounds.   Pulmonary/Chest: Effort normal and breath sounds normal. She has no rales.  Neurological: She is alert and oriented to person, place, and time.  Skin: Skin is warm and dry. No rash noted. She is not diaphoretic. No erythema.  Psychiatric: Her behavior is normal.    Results for orders placed or performed in visit on 06/24/15 (from the past 72 hour(s))  POCT CBC     Status: Abnormal   Collection Time: 06/24/15  5:40 PM  Result Value Ref Range   WBC 8.5 4.6 - 10.2 K/uL   Lymph, poc 0.6 0.6 - 3.4   POC LYMPH PERCENT 7.3 (A) 10 - 50 %L   MID (cbc) 0.7 0 - 0.9   POC MID % 8.2 0 - 12 %M   POC Granulocyte 7.2 (A) 2 - 6.9   Granulocyte percent 84.5 (A) 37 - 80 %G   RBC 4.48 4.04 - 5.48 M/uL   Hemoglobin 13.6 12.2 - 16.2 g/dL   HCT, POC 27.2 53.6 - 47.9 %   MCV 87.1 80 - 97 fL   MCH, POC 30.5 27 - 31.2 pg   MCHC 35.0 31.8 - 35.4 g/dL   RDW, POC 64.4 %   Platelet Count, POC 264 142 - 424 K/uL   MPV 6.5 0 - 99.8 fL    No results  found.  ASSESSMENT AND PLAN  Novaleigh was seen today for cough, chills and sinus problem.  Diagnoses and all orders for this visit:  Chills (without fever): Despite the mild leukocytosis I do not this this is bacterial in origin.  Advised symptomatic therapy for the next few days and that she only fill abx if she is not improving or her symptoms change or worsen.  -     POCT CBC  Viral URI with cough -     HYDROcodone-homatropine (HYCODAN) 5-1.5 MG/5ML syrup; Take 2.5-5 mLs by mouth at bedtime as needed.  Other orders -     azithromycin (ZITHROMAX) 250 MG tablet; Take two on day one and one every day thereafter.    The patient was advised to call or  return to clinic if she does not see an improvement in symptoms or to seek the care of the closest emergency department if she worsens with the above plan.   Deliah Boston, MHS, PA-C [facility] 06/25/2015 5:47 PM

## 2015-07-02 ENCOUNTER — Other Ambulatory Visit: Payer: Self-pay | Admitting: Physician Assistant

## 2015-10-01 ENCOUNTER — Ambulatory Visit (INDEPENDENT_AMBULATORY_CARE_PROVIDER_SITE_OTHER): Payer: BC Managed Care – PPO | Admitting: Physician Assistant

## 2015-10-01 VITALS — BP 124/80 | HR 92 | Temp 98.5°F | Resp 16 | Ht 60.05 in | Wt 135.0 lb

## 2015-10-01 DIAGNOSIS — R300 Dysuria: Secondary | ICD-10-CM

## 2015-10-01 DIAGNOSIS — F419 Anxiety disorder, unspecified: Secondary | ICD-10-CM

## 2015-10-01 LAB — POCT URINALYSIS DIP (MANUAL ENTRY)
BILIRUBIN UA: NEGATIVE
Bilirubin, UA: NEGATIVE
Glucose, UA: NEGATIVE
Leukocytes, UA: NEGATIVE
Nitrite, UA: NEGATIVE
PH UA: 6.5
PROTEIN UA: NEGATIVE
SPEC GRAV UA: 1.01
Urobilinogen, UA: 0.2

## 2015-10-01 LAB — POC MICROSCOPIC URINALYSIS (UMFC): MUCUS RE: ABSENT

## 2015-10-01 MED ORDER — CITALOPRAM HYDROBROMIDE 10 MG PO TABS: 10.0000 mg | ORAL_TABLET | Freq: Every day | ORAL | Status: DC

## 2015-10-01 NOTE — Patient Instructions (Addendum)
     IF you received an x-ray today, you will receive an invoice from [facility]. Please contact [facility] at [phone] with questions or concerns regarding your invoice.   IF you received labwork today, you will receive an invoice from [facility]. Please contact [facility] at [phone] with questions or concerns regarding your invoice.   Our billing staff will not be able to assist you with questions regarding bills from these companies.  You will be contacted with the lab results as soon as they are available. The fastest way to get your results is to activate your My Chart account. Instructions are located on the last page of this paperwork. If you have not heard from us regarding the results in 2 weeks, please contact this office.    Please hydrate well with water.   I would like you to use strainer, with urination.  As soon as I see the results of your urine culture, I will contact you. We will start the celexa.  I will have you return in 4-6 weeks, to see how this is.   Please let me know if you decide to not take this, or if there is complication with the medication.

## 2015-10-01 NOTE — Progress Notes (Signed)
[facility] [address], [place] [id number] [phone]  Date:  10/01/2015   Name:  Aimee ShipperShelley J Gray   DOB:  09/15/1970   MRN:  [id number]  PCP:  No primary care provider on file.   Chief Complaint  Patient presents with  . Urinary Frequency    x2 days; denies fever/blood in urine  . Dysuria    x2 days; urine is cloudy  . Spasms    urinary related    History of Present Illness:  Aimee Gray is a 45 y.o. female patient who presents to [facility] for cc of urinary frequency or dysuria.  She has to urinate every 20 minutes.  She is having spasming in her bladder.  There is not burning but pressure.  No nausea, fever, or hematuria.  No odor.  She has some constipation.  No hx of kidney stone.  No back pain.  No abdominal pain. She has had residual cough, rattling at night.  No sob or dyspnea.     No vaginal discharge.  No periods.  She currently has an IUD.    She also discusses anxiety.  She works as a Runner, broadcasting/film/videoteacher.  Family hx of mood disorders.  She has some loss of interest, appetite, energy, sleep, and irritation.  Home stressors.  She is worried about her job and her home.  No SI/HI.  No auditory or visiual hallucinations.  No impulsive behaviors.    Patient Active Problem List   Diagnosis Date Noted  . Depression 10/14/2014  . Generalized anxiety disorder 10/14/2014    Past Medical History  Diagnosis Date  . Allergy   . Anxiety   . Depression     Past Surgical History  Procedure Laterality Date  . Cesarean section      Social History  Substance Use Topics  . Smoking status: Former Games developermoker  . Smokeless tobacco: None     Comment: quit 5 years ago  . Alcohol Use: None    Family History  Problem Relation Age of Onset  . Heart disease Mother   . Hypertension Mother   . Diabetes Father   . Heart disease Father   . Hyperlipidemia Father   . Hypertension Father   . Cancer Maternal Grandmother   . Diabetes Maternal Grandfather   . Stroke  Maternal Grandfather   . Heart disease Paternal Grandmother   . Hypertension Paternal Grandmother   . Heart disease Paternal Grandfather     No Known Allergies  Medication list has been reviewed and updated.  Current Outpatient Prescriptions on File Prior to Visit  Medication Sig Dispense Refill  . rizatriptan ([name]-MLT) 10 MG disintegrating tablet Take 1 tablet (10 mg total) by mouth as needed for migraine. May repeat in 2 hours if needed (Patient not taking: Reported on 10/01/2015) 10 tablet 11   No current facility-administered medications on file prior to visit.    ROS ROS otherwise unremarkable unless listed above.   Physical Examination: BP 124/80 mmHg  Pulse 92  Temp(Src) 98.5 F (36.9 C) (Oral)  Resp 16  Ht 5' 0.05" (1.525 m)  Wt 135 lb (61.236 kg)  BMI 26.33 kg/m2  SpO2 100% Ideal Body Weight: Weight in (lb) to have BMI = 25: 128  Physical Exam  Constitutional: She is oriented to person, place, and time. She appears well-developed and well-nourished. No distress.  HENT:  Head: Normocephalic and atraumatic.  Right Ear: External ear normal.  Left Ear: External ear normal.  Eyes: Conjunctivae and EOM are normal. Pupils are equal, round, and reactive to light.  Cardiovascular: Normal rate.   Pulmonary/Chest: Effort normal. No respiratory distress.  Abdominal: Soft. Normal appearance and bowel sounds are normal. There is no hepatosplenomegaly. There is tenderness in the suprapubic area. There is no CVA tenderness.  Neurological: She is alert and oriented to person, place, and time.  Skin: She is not diaphoretic.  Psychiatric: She has a normal mood and affect. Her behavior is normal.     Assessment and Plan: Aimee Gray is a 45 y.o. female who is here today for cc of dysuria. -given celexa at this time.   -will await urine culture prior to treatment.  Anxiety - Plan: citalopram (CELEXA) 10 MG tablet  Dysuria - Plan: POCT urinalysis dipstick, POCT  Microscopic Urinalysis ([facility]), Urine culture  Trena Platt, PA-C [facility] 10/01/2015 4:23 PM

## 2015-10-03 LAB — URINE CULTURE: Colony Count: 75000

## 2015-10-05 ENCOUNTER — Other Ambulatory Visit: Payer: Self-pay | Admitting: Physician Assistant

## 2015-10-05 DIAGNOSIS — N3001 Acute cystitis with hematuria: Secondary | ICD-10-CM

## 2015-10-05 MED ORDER — SULFAMETHOXAZOLE-TRIMETHOPRIM 800-160 MG PO TABS: 1.0000 | ORAL_TABLET | Freq: Two times a day (BID) | ORAL | Status: DC

## 2015-11-09 ENCOUNTER — Ambulatory Visit (INDEPENDENT_AMBULATORY_CARE_PROVIDER_SITE_OTHER): Payer: BC Managed Care – PPO | Admitting: Physician Assistant

## 2015-11-09 VITALS — BP 102/66 | HR 66 | Temp 98.0°F | Resp 16 | Ht 60.5 in | Wt 137.8 lb

## 2015-11-09 DIAGNOSIS — G43101 Migraine with aura, not intractable, with status migrainosus: Secondary | ICD-10-CM

## 2015-11-09 DIAGNOSIS — F411 Generalized anxiety disorder: Secondary | ICD-10-CM | POA: Diagnosis not present

## 2015-11-09 MED ORDER — HYDROXYZINE HCL 25 MG PO TABS: 25.0000 mg | ORAL_TABLET | Freq: Three times a day (TID) | ORAL | Status: DC | PRN

## 2015-11-09 MED ORDER — RIZATRIPTAN BENZOATE 10 MG PO TBDP: 10.0000 mg | ORAL_TABLET | ORAL | Status: DC | PRN

## 2015-11-09 NOTE — Progress Notes (Signed)
[facility] [address] [place] [address] [phone]  Date:  11/09/2015   Name:  Aimee Gray   DOB:  [birth year]   MRN:  [id number]  PCP:  No primary care provider on file.   Chief Complaint  Patient presents with  . Follow-up    celexa  . Medication Refill    [name]     History of Present Illness:  Aimee Gray is a 45 y.o. female patient who presents to [facility] for cc of celexa and medication refill.  Migraines: as needed.  3-4 times per week one month. 1-2 per month for1 month.  The migraines have been well controlled with the maxalt.  Pain generally occurs  Patient has discontinued the celexa  After 1 month use.  She was reminded through the restart, that the med decreased her libido, which is crucial in her marital relationship.  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  She is also transferring to a different grade and may not be as interactive as much with a disgruntled co-worker.   Patient Active Problem List   Diagnosis Date Noted  . Depression 10/14/2014  . Generalized anxiety disorder 10/14/2014    Past Medical History  Diagnosis Date  . Allergy   . Anxiety   . Depression     Past Surgical History  Procedure Laterality Date  . Cesarean section      Social History  Substance Use Topics  . Smoking status: Former Games developer  . Smokeless tobacco: None     Comment: quit 5 years ago  . Alcohol Use: None    Family History  Problem Relation Age of Onset  . Heart disease Mother   . Hypertension Mother   . Diabetes Father   . Heart disease Father   . Hyperlipidemia Father   . Hypertension Father   . Cancer Maternal Grandmother   . Diabetes Maternal Grandfather   . Stroke Maternal Grandfather   . Heart disease Paternal Grandmother   . Hypertension Paternal Grandmother   . Heart disease Paternal Grandfather     No Known  Allergies  Medication list has been reviewed and updated.  Current Outpatient Prescriptions on File Prior to Visit  Medication Sig Dispense Refill  . citalopram (CELEXA) 10 MG tablet Take 1 tablet (10 mg total) by mouth daily. 30 tablet 1  . rizatriptan ([name]-MLT) 10 MG disintegrating tablet Take 1 tablet (10 mg total) by mouth as needed for migraine. May repeat in 2 hours if needed 10 tablet 11  . sulfamethoxazole-trimethoprim (BACTRIM DS,SEPTRA DS) 800-160 MG tablet Take 1 tablet by mouth 2 (two) times daily. (Patient not taking: Reported on 11/09/2015) 10 tablet 0   No current facility-administered medications on file prior to visit.    ROS ROS otherwise unremarkable unless listed above.   Physical Examination: BP 102/66 mmHg  Pulse 66  Temp(Src) 98 F (36.7 C) (Oral)  Resp 16  Ht 5' 0.5" (1.537 m)  Wt 137 lb 12.8 oz (62.506 kg)  BMI 26.46 kg/m2  SpO2 99% Ideal Body Weight: Weight in (lb) to have BMI = 25: 129.9  Physical Exam  Constitutional: She is oriented to person, place, and time. She appears well-developed and well-nourished. No distress.  HENT:  Head: Normocephalic and atraumatic.  Right Ear: External ear normal.  Left Ear: External ear normal.  Eyes: Conjunctivae and EOM  are normal. Pupils are equal, round, and reactive to light.  Cardiovascular: Normal rate.   Pulmonary/Chest: Effort normal. No respiratory distress.  Neurological: She is alert and oriented to person, place, and time.  Skin: She is not diaphoretic.  Psychiatric: She has a normal mood and affect. Her behavior is normal.     Assessment and Plan: Aimee Gray is a 45 y.o. female who is here today for refill of the maxalt and medication management. At this time, the celexa is not an appropriate fit.  She is well controlled without pharmacotherapy.  She would like to try symptomatic tx for anxiety, and this is fine.  Discussed risk benefits.  We agreed to try hydroxyzine at this time.   She  will return in 1-6 months for follow up. Medication refill given.   Migraine with aura and with status migrainosus, not intractable - Plan: rizatriptan ([name]-MLT) 10 MG disintegrating tablet  Anxiety state - Plan: hydrOXYzine (ATARAX/VISTARIL) 25 MG tablet  Aimee PlattStephanie English, PA-C [facility] 11/09/2015 2:25 PM

## 2015-11-09 NOTE — Patient Instructions (Addendum)
     IF you received an x-ray today, you will receive an invoice from [facility]. Please contact [facility] at [phone] with questions or concerns regarding your invoice.   IF you received labwork today, you will receive an invoice from [facility]. Please contact [facility] at [phone] with questions or concerns regarding your invoice.   Our billing staff will not be able to assist you with questions regarding bills from these companies.  You will be contacted with the lab results as soon as they are available. The fastest way to get your results is to activate your My Chart account. Instructions are located on the last page of this paperwork. If you have not heard from us regarding the results in 2 weeks, please contact this office.    Please try 1 tablet at first.  You likely will never need 4 tablets.   I would like you to follow up in 1-6 months. If this does not help with the anxiety, I would like to know.  We do have other options.

## 2016-08-10 ENCOUNTER — Ambulatory Visit (INDEPENDENT_AMBULATORY_CARE_PROVIDER_SITE_OTHER): Payer: BC Managed Care – PPO | Admitting: Emergency Medicine

## 2016-08-10 VITALS — BP 124/80 | HR 92 | Temp 98.4°F | Resp 17 | Ht 61.5 in | Wt 148.0 lb

## 2016-08-10 DIAGNOSIS — G43101 Migraine with aura, not intractable, with status migrainosus: Secondary | ICD-10-CM | POA: Diagnosis not present

## 2016-08-10 DIAGNOSIS — R059 Cough, unspecified: Secondary | ICD-10-CM

## 2016-08-10 DIAGNOSIS — R05 Cough: Secondary | ICD-10-CM | POA: Diagnosis not present

## 2016-08-10 DIAGNOSIS — J209 Acute bronchitis, unspecified: Secondary | ICD-10-CM | POA: Diagnosis not present

## 2016-08-10 DIAGNOSIS — R0981 Nasal congestion: Secondary | ICD-10-CM

## 2016-08-10 MED ORDER — HYDROCODONE-HOMATROPINE 5-1.5 MG/5ML PO SYRP
5.0000 mL | ORAL_SOLUTION | Freq: Three times a day (TID) | ORAL | 0 refills | Status: DC | PRN
Start: 2016-08-10 — End: 2016-10-14

## 2016-08-10 MED ORDER — AMOXICILLIN-POT CLAVULANATE 875-125 MG PO TABS: 1.0000 | ORAL_TABLET | Freq: Two times a day (BID) | ORAL | 0 refills | Status: AC

## 2016-08-10 MED ORDER — RIZATRIPTAN BENZOATE 10 MG PO TBDP: 10.0000 mg | ORAL_TABLET | ORAL | 5 refills | Status: DC | PRN

## 2016-08-10 NOTE — Progress Notes (Signed)
Aimee Gray 46 y.o.   Chief Complaint  Patient presents with  . Cough  . URI    HISTORY OF PRESENT ILLNESS: This is a 46 y.o. female complaining of cough x 3 weeks.  Cough  This is a new problem. The current episode started 1 to 4 weeks ago. The problem has been gradually worsening. The problem occurs every few minutes. The cough is productive of sputum. Associated symptoms include nasal congestion. Pertinent negatives include no chest pain, chills, ear congestion, ear pain, eye redness, fever, headaches, heartburn, hemoptysis, myalgias, rash, sore throat, shortness of breath or wheezing. She has tried OTC cough suppressant for the symptoms. The treatment provided no relief. There is no history of asthma, COPD, emphysema or pneumonia.     Prior to Admission medications   Medication Sig Start Date End Date Taking? Authorizing Provider  rizatriptan ([name]-MLT) 10 MG disintegrating tablet Take 1 tablet (10 mg total) by mouth as needed for migraine. May repeat in 2 hours if needed 11/09/15  Yes Collie SiadStephanie D English, PA    No Known Allergies  Patient Active Problem List   Diagnosis Date Noted  . Depression 10/14/2014  . Generalized anxiety disorder 10/14/2014    Past Medical History:  Diagnosis Date  . Allergy   . Anxiety   . Depression     Past Surgical History:  Procedure Laterality Date  . CESAREAN SECTION      Social History   Social History  . Marital status: Married    Spouse name: N/A  . Number of children: N/A  . Years of education: N/A   Occupational History  . Not on file.   Social History Main Topics  . Smoking status: Former Games developermoker  . Smokeless tobacco: Never Used     Comment: quit 5 years ago  . Alcohol use No  . Drug use: No  . Sexual activity: No   Other Topics Concern  . Not on file   Social History Narrative  . No narrative on file    Family History  Problem Relation Age of Onset  . Heart disease Mother   . Hypertension Mother     . Diabetes Father   . Heart disease Father   . Hyperlipidemia Father   . Hypertension Father   . Cancer Maternal Grandmother   . Diabetes Maternal Grandfather   . Stroke Maternal Grandfather   . Heart disease Paternal Grandmother   . Hypertension Paternal Grandmother   . Heart disease Paternal Grandfather      Review of Systems  Constitutional: Negative for chills, fever and malaise/fatigue.  HENT: Positive for congestion. Negative for ear discharge, ear pain and sore throat.   Eyes: Negative.  Negative for blurred vision, double vision, discharge and redness.  Respiratory: Positive for cough and sputum production. Negative for hemoptysis, shortness of breath and wheezing.   Cardiovascular: Negative for chest pain, palpitations and leg swelling.  Gastrointestinal: Negative for abdominal pain, diarrhea, heartburn, nausea and vomiting.  Genitourinary: Negative for dysuria and hematuria.  Musculoskeletal: Negative for myalgias.  Skin: Negative for rash.  Neurological: Negative for dizziness, sensory change, focal weakness and headaches.  Endo/Heme/Allergies: Negative.   All other systems reviewed and are negative.  Vitals:   08/10/16 1548  BP: 124/80  Pulse: 92  Resp: 17  Temp: 98.4 F (36.9 C)     Physical Exam  Constitutional: She is oriented to person, place, and time. She appears well-developed and well-nourished.  HENT:  Head: Normocephalic and atraumatic.  Right Ear: External ear normal.  Left Ear: External ear normal.  Nose: Nose normal.  Mouth/Throat: Oropharynx is clear and moist. No oropharyngeal exudate.  Eyes: Conjunctivae and EOM are normal. Pupils are equal, round, and reactive to light.  Neck: Normal range of motion. Neck supple. No JVD present. No thyromegaly present.  Cardiovascular: Normal rate, regular rhythm, normal heart sounds and intact distal pulses.   Pulmonary/Chest: Effort normal and breath sounds normal. She has no wheezes. She has no rales.   Has bronchitic cough.  Abdominal: Soft. Bowel sounds are normal. She exhibits no distension. There is no tenderness.  Musculoskeletal: Normal range of motion.  Lymphadenopathy:    She has no cervical adenopathy.  Neurological: She is alert and oriented to person, place, and time. No sensory deficit. She exhibits normal muscle tone.  Skin: Skin is warm and dry. Capillary refill takes less than 2 seconds.  Psychiatric: She has a normal mood and affect. Her behavior is normal.  Vitals reviewed.    ASSESSMENT & PLAN: Aimee Gray was seen today for cough and uri.  Diagnoses and all orders for this visit:  Acute bronchitis, unspecified organism  Cough  Sinus congestion  Migraine with aura and with status migrainosus, not intractable -     rizatriptan ([name]-MLT) 10 MG disintegrating tablet; Take 1 tablet (10 mg total) by mouth as needed for migraine. May repeat in 2 hours if needed  Other orders -     amoxicillin-clavulanate (AUGMENTIN) 875-125 MG tablet; Take 1 tablet by mouth 2 (two) times daily. -     HYDROcodone-homatropine (HYCODAN) 5-1.5 MG/5ML syrup; Take 5 mLs by mouth every 8 (eight) hours as needed for cough.    Patient Instructions       IF you received an x-ray today, you will receive an invoice from [facility]. Please contact [facility] at [phone] with questions or concerns regarding your invoice.   IF you received labwork today, you will receive an invoice from [place]. Please contact [facility] at [phone] with questions or concerns regarding your invoice.   Our billing staff will not be able to assist you with questions regarding bills from these companies.  You will be contacted with the lab results as soon as they are available. The fastest way to get your results is to activate your My Chart account. Instructions are located on the last page of this paperwork. If you have not heard from [name] regarding the results in 2 weeks, please  contact this office.      Acute Bronchitis, Adult Acute bronchitis is when air tubes (bronchi) in the lungs suddenly get swollen. The condition can make it hard to breathe. It can also cause these symptoms:  A cough.  Coughing up clear, yellow, or green mucus.  Wheezing.  Chest congestion.  Shortness of breath.  A fever.  Body aches.  Chills.  A sore throat. Follow these instructions at home: Medicines   Take over-the-counter and prescription medicines only as told by your doctor.  If you were prescribed an antibiotic medicine, take it as told by your doctor. Do not stop taking the antibiotic even if you start to feel better. General instructions   Rest.  Drink enough fluids to keep your pee (urine) clear or pale yellow.  Avoid smoking and secondhand smoke. If you smoke and you need help quitting, ask your doctor. Quitting will help your lungs heal faster.  Use an inhaler, cool mist vaporizer, or humidifier as told by your doctor.  Keep all  follow-up visits as told by your doctor. This is important. How is this prevented? To lower your risk of getting this condition again:  Wash your hands often with soap and water. If you cannot use soap and water, use hand sanitizer.  Avoid contact with people who have cold symptoms.  Try not to touch your hands to your mouth, nose, or eyes.  Make sure to get the flu shot every year. Contact a doctor if:  Your symptoms do not get better in 2 weeks. Get help right away if:  You cough up blood.  You have chest pain.  You have very bad shortness of breath.  You become dehydrated.  You faint (pass out) or keep feeling like you are going to pass out.  You keep throwing up (vomiting).  You have a very bad headache.  Your fever or chills gets worse. This information is not intended to replace advice given to you by your health care provider. Make sure you discuss any questions you have with your health care  provider. Document Released: 10/26/2007 Document Revised: 12/16/2015 Document Reviewed: 10/28/2015 Elsevier Interactive Patient Education  2017 Elsevier Inc.      Edwina Barth, MD [facility]

## 2016-08-10 NOTE — Patient Instructions (Addendum)
     IF you received an x-ray today, you will receive an invoice from [facility]. Please contact [facility] at [phone] with questions or concerns regarding your invoice.   IF you received labwork today, you will receive an invoice from [facility]. Please contact [facility] at [phone] with questions or concerns regarding your invoice.   Our billing staff will not be able to assist you with questions regarding bills from these companies.  You will be contacted with the lab results as soon as they are available. The fastest way to get your results is to activate your My Chart account. Instructions are located on the last page of this paperwork. If you have not heard from us regarding the results in 2 weeks, please contact this office.      Acute Bronchitis, Adult Acute bronchitis is when air tubes (bronchi) in the lungs suddenly get swollen. The condition can make it hard to breathe. It can also cause these symptoms:  A cough.  Coughing up clear, yellow, or green mucus.  Wheezing.  Chest congestion.  Shortness of breath.  A fever.  Body aches.  Chills.  A sore throat.  Follow these instructions at home: Medicines  Take over-the-counter and prescription medicines only as told by your doctor.  If you were prescribed an antibiotic medicine, take it as told by your doctor. Do not stop taking the antibiotic even if you start to feel better. General instructions  Rest.  Drink enough fluids to keep your pee (urine) clear or pale yellow.  Avoid smoking and secondhand smoke. If you smoke and you need help quitting, ask your doctor. Quitting will help your lungs heal faster.  Use an inhaler, cool mist vaporizer, or humidifier as told by your doctor.  Keep all follow-up visits as told by your doctor. This is important. How is this prevented? To lower your risk of getting this condition again:  Wash your hands often with soap and water. If you cannot  use soap and water, use hand sanitizer.  Avoid contact with people who have cold symptoms.  Try not to touch your hands to your mouth, nose, or eyes.  Make sure to get the flu shot every year.  Contact a doctor if:  Your symptoms do not get better in 2 weeks. Get help right away if:  You cough up blood.  You have chest pain.  You have very bad shortness of breath.  You become dehydrated.  You faint (pass out) or keep feeling like you are going to pass out.  You keep throwing up (vomiting).  You have a very bad headache.  Your fever or chills gets worse. This information is not intended to replace advice given to you by your health care provider. Make sure you discuss any questions you have with your health care provider. Document Released: 10/26/2007 Document Revised: 12/16/2015 Document Reviewed: 10/28/2015 Elsevier Interactive Patient Education  2017 Elsevier Inc.  

## 2016-08-12 ENCOUNTER — Telehealth: Payer: Self-pay | Admitting: Emergency Medicine

## 2016-08-12 ENCOUNTER — Telehealth: Payer: Self-pay | Admitting: General Practice

## 2016-08-12 NOTE — Telephone Encounter (Signed)
Pt was prescribed HYDROcodone-homatropine (HYCODAN) 5-1.5 MG/5ML syrup [[id number]][[id number]] and it made her sick and she threw up. Pt was wondering if she could get something else for her cough.  Please advise: [phone]

## 2016-08-12 NOTE — Telephone Encounter (Signed)
Suggestions for other cough med?

## 2016-08-12 NOTE — Telephone Encounter (Signed)
See prior note

## 2016-08-12 NOTE — Telephone Encounter (Signed)
Pt is needing a new cough medication they hydrocodone cough medication is making her sick   Best number [phone]

## 2016-08-13 ENCOUNTER — Other Ambulatory Visit: Payer: Self-pay | Admitting: Emergency Medicine

## 2016-08-13 MED ORDER — PROMETHAZINE-DM 6.25-15 MG/5ML PO SYRP: 5.0000 mL | ORAL_SOLUTION | Freq: Four times a day (QID) | ORAL | 0 refills | Status: DC | PRN

## 2016-08-13 MED ORDER — BENZONATATE 200 MG PO CAPS: 200.0000 mg | ORAL_CAPSULE | Freq: Two times a day (BID) | ORAL | 0 refills | Status: DC | PRN

## 2016-08-13 NOTE — Telephone Encounter (Signed)
Yes. Tessalon and Phenergan/DM called in. Thanks.

## 2016-08-13 NOTE — Telephone Encounter (Signed)
l/m new rx called in

## 2016-10-14 ENCOUNTER — Encounter: Payer: Self-pay | Admitting: Physician Assistant

## 2016-10-14 ENCOUNTER — Ambulatory Visit (INDEPENDENT_AMBULATORY_CARE_PROVIDER_SITE_OTHER): Payer: BC Managed Care – PPO | Admitting: Physician Assistant

## 2016-10-14 VITALS — BP 119/81 | HR 80 | Temp 97.9°F | Resp 18 | Ht 61.5 in | Wt 145.8 lb

## 2016-10-14 DIAGNOSIS — R070 Pain in throat: Secondary | ICD-10-CM

## 2016-10-14 DIAGNOSIS — J029 Acute pharyngitis, unspecified: Secondary | ICD-10-CM

## 2016-10-14 MED ORDER — AMOXICILLIN 500 MG PO TABS: 500.0000 mg | ORAL_TABLET | Freq: Two times a day (BID) | ORAL | 0 refills | Status: AC

## 2016-10-14 NOTE — Patient Instructions (Addendum)
     IF you received an x-ray today, you will receive an invoice from [facility]. Please contact [facility] at [phone] with questions or concerns regarding your invoice.   IF you received labwork today, you will receive an invoice from [facility]. Please contact [facility] at [phone] with questions or concerns regarding your invoice.   Our billing staff will not be able to assist you with questions regarding bills from these companies.  You will be contacted with the lab results as soon as they are available. The fastest way to get your results is to activate your My Chart account. Instructions are located on the last page of this paperwork. If you have not heard from us regarding the results in 2 weeks, please contact this office.     Pharyngitis Pharyngitis is a sore throat (pharynx). There is redness, pain, and swelling of your throat. Follow these instructions at home:  Drink enough fluids to keep your pee (urine) clear or pale yellow.  Only take medicine as told by your doctor.  You may get sick again if you do not take medicine as told. Finish your medicines, even if you start to feel better.  Do not take aspirin.  Rest.  Rinse your mouth (gargle) with salt water ( tsp of salt per 1 qt of water) every 1-2 hours. This will help the pain.  If you are not at risk for choking, you can suck on hard candy or sore throat lozenges. Contact a doctor if:  You have large, tender lumps on your neck.  You have a rash.  You cough up green, yellow-brown, or bloody spit. Get help right away if:  You have a stiff neck.  You drool or cannot swallow liquids.  You throw up (vomit) or are not able to keep medicine or liquids down.  You have very bad pain that does not go away with medicine.  You have problems breathing (not from a stuffy nose). This information is not intended to replace advice given to you by your health care provider. Make sure you  discuss any questions you have with your health care provider. Document Released: 10/26/2007 Document Revised: 10/15/2015 Document Reviewed: 01/14/2013 Elsevier Interactive Patient Education  2017 Elsevier Inc.  

## 2016-10-16 NOTE — Progress Notes (Signed)
PRIMARY CARE AT [facility] [address] [place] [address] [phone]  Date:  10/14/2016   Name:  Aimee Gray   DOB:  07/26/1970   MRN:  [id number]  PCP:  Patient, No Pcp Per    History of Present Illness:  Aimee Gray is a 46 y.o. female patient who presents to PCP with  Chief Complaint  Patient presents with  . Sore Throat  . Fatigue  . Headache     Sore throat, fatigue, and headache for about 1 week.  No fevers to note at this time.  No coughing, or nasal congestion.  Son had dx of strep throat last week.  Patient Active Problem List   Diagnosis Date Noted  . Acute bronchitis 08/10/2016  . Migraine with aura and with status migrainosus, not intractable 08/10/2016  . Depression 10/14/2014  . Generalized anxiety disorder 10/14/2014    Past Medical History:  Diagnosis Date  . Allergy   . Anxiety   . Depression     Past Surgical History:  Procedure Laterality Date  . CESAREAN SECTION      Social History  Substance Use Topics  . Smoking status: Former Games developer  . Smokeless tobacco: Never Used     Comment: quit 5 years ago  . Alcohol use No    Family History  Problem Relation Age of Onset  . Heart disease Mother   . Hypertension Mother   . Diabetes Father   . Heart disease Father   . Hyperlipidemia Father   . Hypertension Father   . Cancer Maternal Grandmother   . Diabetes Maternal Grandfather   . Stroke Maternal Grandfather   . Heart disease Paternal Grandmother   . Hypertension Paternal Grandmother   . Heart disease Paternal Grandfather     Allergies  Allergen Reactions  . Hydrocodone Nausea And Vomiting    Medication list has been reviewed and updated.  Current Outpatient Prescriptions on File Prior to Visit  Medication Sig Dispense Refill  . rizatriptan ([name]-MLT) 10 MG disintegrating tablet Take 1 tablet (10 mg total) by mouth as needed for migraine. May repeat in 2 hours if needed 20 tablet 5   No current facility-administered  medications on file prior to visit.     ROS ROS otherwise unremarkable unless listed above.  Physical Examination: BP 119/81   Pulse 80   Temp 97.9 F (36.6 C) (Oral)   Resp 18   Ht 5' 1.5" (1.562 m)   Wt 145 lb 12.8 oz (66.1 kg)   SpO2 100%   BMI 27.10 kg/m  Ideal Body Weight: Weight in (lb) to have BMI = 25: 134.2  Physical Exam  Constitutional: She is oriented to person, place, and time. She appears well-developed and well-nourished. No distress.  HENT:  Head: Normocephalic and atraumatic.  Right Ear: Tympanic membrane, external ear and ear canal normal.  Left Ear: Tympanic membrane, external ear and ear canal normal.  Nose: Nose normal.  Mouth/Throat: Posterior oropharyngeal erythema (mild) present.  Eyes: Conjunctivae and EOM are normal. Pupils are equal, round, and reactive to light.  Cardiovascular: Normal rate.   Pulmonary/Chest: Effort normal. No respiratory distress.  Neurological: She is alert and oriented to person, place, and time.  Skin: She is not diaphoretic.  Psychiatric: She has a normal mood and affect. Her behavior is normal.     Assessment and Plan: Aimee Gray is a 46 y.o. female who is here today for sore throat. No diagnosis found.  Trena Platt,  PA-C [facility] 5/27/20185:28 PM  There are no diagnoses linked to this encounter.  Trena PlattStephanie Novia Lansberry, PA-C [facility] 10/16/2016 5:26 PM

## 2017-03-01 ENCOUNTER — Ambulatory Visit (INDEPENDENT_AMBULATORY_CARE_PROVIDER_SITE_OTHER): Payer: BC Managed Care – PPO | Admitting: Physician Assistant

## 2017-03-01 ENCOUNTER — Encounter: Payer: Self-pay | Admitting: Physician Assistant

## 2017-03-01 VITALS — BP 122/78 | HR 87 | Temp 98.2°F | Resp 17 | Ht 61.5 in | Wt 145.0 lb

## 2017-03-01 DIAGNOSIS — F411 Generalized anxiety disorder: Secondary | ICD-10-CM

## 2017-03-01 MED ORDER — GABAPENTIN 300 MG PO CAPS: 300.0000 mg | ORAL_CAPSULE | Freq: Two times a day (BID) | ORAL | 0 refills | Status: DC

## 2017-03-01 NOTE — Progress Notes (Signed)
PRIMARY CARE AT [facility] [address], [place] [address] [phone]  Date:  03/01/2017   Name:  Aimee Gray   DOB:  12/05/1970   MRN:  [id number]  PCP:  Patient, No Pcp Per    History of Present Illness:  Aimee Gray is a 46 y.o. female patient who presents to PCP with  Chief Complaint  Patient presents with  . Panic Attack  . Depression     Patient reports history of panic attacks that appear to be more prominent in the last 3 months, since the start of school She feels depressed mood, more fatigue, high irritation, loss of energy.  She has no si.  Thought of HI, which she would never perform.  Towards work Therapist, music.  Nothing abnormal than usual, or what people think when they are stressed out with people.  No plan or attempt.  Nothing toward family. She stopped the past ssri due to decreased libido.   She is upset about the demands of her work.  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  She reports that she is generally good at her work, but feels like she is failing.  Her husband is a Runner, broadcasting/film/video in same school, but feels like he can detach himself from the frustrations at work. She has used xanax in the past which was helpful to her.    Patient Active Problem List   Diagnosis Date Noted  . Acute bronchitis 08/10/2016  . Migraine with aura and with status migrainosus, not intractable 08/10/2016  . Depression 10/14/2014  . Generalized anxiety disorder 10/14/2014    Past Medical History:  Diagnosis Date  . Allergy   . Anxiety   . Depression     Past Surgical History:  Procedure Laterality Date  . CESAREAN SECTION      Social History  Substance Use Topics  . Smoking status: Former Games developer  . Smokeless tobacco: Never Used     Comment: quit 5 years ago  . Alcohol use No    Family History  Problem Relation Age of Onset  . Heart disease Mother   . Hypertension Mother   . Diabetes  Father   . Heart disease Father   . Hyperlipidemia Father   . Hypertension Father   . Cancer Maternal Grandmother   . Diabetes Maternal Grandfather   . Stroke Maternal Grandfather   . Heart disease Paternal Grandmother   . Hypertension Paternal Grandmother   . Heart disease Paternal Grandfather     Allergies  Allergen Reactions  . Hydrocodone Nausea And Vomiting    Medication list has been reviewed and updated.  Current Outpatient Prescriptions on File Prior to Visit  Medication Sig Dispense Refill  . rizatriptan ([name]-MLT) 10 MG disintegrating tablet Take 1 tablet (10 mg total) by mouth as needed for migraine. May repeat in 2 hours if needed 20 tablet 5   No current facility-administered medications on file prior to visit.     ROS ROS otherwise unremarkable unless listed above.  Physical Examination: BP 122/78   Pulse 87   Temp 98.2 F (36.8 C) (Oral)   Resp 17   Ht 5' 1.5" (1.562 m)   Wt 145 lb (65.8 kg)   SpO2 98%   BMI 26.95 kg/m  Ideal Body Weight: Weight in (lb) to have BMI = 25: 134.2  Physical Exam  Constitutional: She is oriented to person, place, and time. She  appears well-developed and well-nourished. No distress.  HENT:  Head: Normocephalic and atraumatic.  Right Ear: External ear normal.  Left Ear: External ear normal.  Eyes: Pupils are equal, round, and reactive to light. Conjunctivae and EOM are normal.  Cardiovascular: Normal rate.   Pulmonary/Chest: Effort normal. No respiratory distress.  Neurological: She is alert and oriented to person, place, and time.  Skin: She is not diaphoretic.  Psychiatric: Her behavior is normal. Judgment and thought content normal. Her affect is labile. Her affect is not inappropriate. Her speech is not rapid and/or pressured. Cognition and memory are normal. She expresses no homicidal and no suicidal ideation. She expresses no suicidal plans and no homicidal plans. She is attentive.     Assessment and  Plan: Aimee Gray is a 46 y.o. female who is here today for cc of anxiety More than 25 minutes of direct patient contact, obtaining history, counseling, etc.  Due to the past difficulties with her qol with ssri.  We will try off-label use of the gabapentin.  She has migraines, which may add a dual treatment.  Will start with 1 tablet increasing qhs.  After 3 days she will make it twice per day for three days, increasing q3days.  Qhs--> 1 tablet bid--> 1 morning and 2 tablet nightly-->2 bid, etc.  Max 1800 mg and returning in 2-3 weeks. Warned of side effects to warrant discontinue, and quick rtc. She voiced understanding. If this fails, may consider long acting benzo Generalized anxiety disorder - Plan: gabapentin (NEURONTIN) 300 MG capsule  Trena Platt, PA-C [facility] 10/19/201811:22 AM

## 2017-03-01 NOTE — Patient Instructions (Addendum)
Gabapentin capsules or tablets What is this medicine? GABAPENTIN (GA ba pen tin) is used to control partial seizures in adults with epilepsy. It is also used to treat certain types of nerve pain. This medicine may be used for other purposes; ask your health care provider or pharmacist if you have questions. COMMON BRAND NAME(S): Active-PAC with Gabapentin, Gabarone, Neurontin What should I tell my health care provider before I take this medicine? They need to know if you have any of these conditions: -kidney disease -suicidal thoughts, plans, or attempt; a previous suicide attempt by you or a family member -an unusual or allergic reaction to gabapentin, other medicines, foods, dyes, or preservatives -pregnant or trying to get pregnant -breast-feeding How should I use this medicine? Take this medicine by mouth with a glass of water. Follow the directions on the prescription label. You can take it with or without food. If it upsets your stomach, take it with food.Take your medicine at regular intervals. Do not take it more often than directed. Do not stop taking except on your doctor's advice. If you are directed to break the 600 or 800 mg tablets in half as part of your dose, the extra half tablet should be used for the next dose. If you have not used the extra half tablet within 28 days, it should be thrown away. A special MedGuide will be given to you by the pharmacist with each prescription and refill. Be sure to read this information carefully each time. Talk to your pediatrician regarding the use of this medicine in children. Special care may be needed. Overdosage: If you think you have taken too much of this medicine contact a poison control center or emergency room at once. NOTE: This medicine is only for you. Do not share this medicine with others. What if I miss a dose? If you miss a dose, take it as soon as you can. If it is almost time for your next dose, take only that dose. Do not  take double or extra doses. What may interact with this medicine? Do not take this medicine with any of the following medications: -other gabapentin products This medicine may also interact with the following medications: -alcohol -antacids -antihistamines for allergy, cough and cold -certain medicines for anxiety or sleep -certain medicines for depression or psychotic disturbances -homatropine; hydrocodone -naproxen -narcotic medicines (opiates) for pain -phenothiazines like chlorpromazine, mesoridazine, prochlorperazine, thioridazine This list may not describe all possible interactions. Give your health care provider a list of all the medicines, herbs, non-prescription drugs, or dietary supplements you use. Also tell them if you smoke, drink alcohol, or use illegal drugs. Some items may interact with your medicine. What should I watch for while using this medicine? Visit your doctor or health care professional for regular checks on your progress. You may want to keep a record at home of how you feel your condition is responding to treatment. You may want to share this information with your doctor or health care professional at each visit. You should contact your doctor or health care professional if your seizures get worse or if you have any new types of seizures. Do not stop taking this medicine or any of your seizure medicines unless instructed by your doctor or health care professional. Stopping your medicine suddenly can increase your seizures or their severity. Wear a medical identification bracelet or chain if you are taking this medicine for seizures, and carry a card that lists all your medications. You may get drowsy,  dizzy, or have blurred vision. Do not drive, use machinery, or do anything that needs mental alertness until you know how this medicine affects you. To reduce dizzy or fainting spells, do not sit or stand up quickly, especially if you are an older patient. Alcohol can  increase drowsiness and dizziness. Avoid alcoholic drinks. Your mouth may get dry. Chewing sugarless gum or sucking hard candy, and drinking plenty of water will help. The use of this medicine may increase the chance of suicidal thoughts or actions. Pay special attention to how you are responding while on this medicine. Any worsening of mood, or thoughts of suicide or dying should be reported to your health care professional right away. Women who become pregnant while using this medicine may enroll in the [place] American Antiepileptic Drug Pregnancy Registry by calling [phone]. This registry collects information about the safety of antiepileptic drug use during pregnancy. What side effects may I notice from receiving this medicine? Side effects that you should report to your doctor or health care professional as soon as possible: -allergic reactions like skin rash, itching or hives, swelling of the face, lips, or tongue -worsening of mood, thoughts or actions of suicide or dying Side effects that usually do not require medical attention (report to your doctor or health care professional if they continue or are bothersome): -constipation -difficulty walking or controlling muscle movements -dizziness -nausea -slurred speech -tiredness -tremors -weight gain This list may not describe all possible side effects. Call your doctor for medical advice about side effects. You may report side effects to FDA at 1-800-FDA-1088. Where should I keep my medicine? Keep out of reach of children. This medicine may cause accidental overdose and death if it taken by other adults, children, or pets. Mix any unused medicine with a substance like cat litter or coffee grounds. Then throw the medicine away in a sealed container like a sealed bag or a coffee can with a lid. Do not use the medicine after the expiration date. Store at room temperature between 15 and 30 degrees C (59 and 86 degrees F). NOTE: This  sheet is a summary. It may not cover all possible information. If you have questions about this medicine, talk to your doctor, pharmacist, or health care provider.  2018 Elsevier/Gold Standard (2013-07-05 15:26:50)    IF you received an x-ray today, you will receive an invoice from [facility]. Please contact [facility] at [phone] with questions or concerns regarding your invoice.   IF you received labwork today, you will receive an invoice from [facility]. Please contact [facility] at [phone] with questions or concerns regarding your invoice.   Our billing staff will not be able to assist you with questions regarding bills from these companies.  You will be contacted with the lab results as soon as they are available. The fastest way to get your results is to activate your My Chart account. Instructions are located on the last page of this paperwork. If you have not heard from [name] regarding the results in 2 weeks, please contact this office.

## 2017-03-15 ENCOUNTER — Ambulatory Visit (INDEPENDENT_AMBULATORY_CARE_PROVIDER_SITE_OTHER): Payer: BC Managed Care – PPO | Admitting: Physician Assistant

## 2017-03-15 ENCOUNTER — Encounter: Payer: Self-pay | Admitting: Physician Assistant

## 2017-03-15 VITALS — BP 122/72 | HR 81 | Temp 98.1°F | Resp 15 | Ht 61.5 in | Wt 147.0 lb

## 2017-03-15 DIAGNOSIS — F411 Generalized anxiety disorder: Secondary | ICD-10-CM | POA: Diagnosis not present

## 2017-03-15 DIAGNOSIS — M549 Dorsalgia, unspecified: Secondary | ICD-10-CM | POA: Diagnosis not present

## 2017-03-15 DIAGNOSIS — G8929 Other chronic pain: Secondary | ICD-10-CM | POA: Diagnosis not present

## 2017-03-15 MED ORDER — GABAPENTIN 600 MG PO TABS: 900.0000 mg | ORAL_TABLET | Freq: Two times a day (BID) | ORAL | 1 refills | Status: DC

## 2017-03-15 MED ORDER — IBUPROFEN 600 MG PO TABS: 600.0000 mg | ORAL_TABLET | Freq: Three times a day (TID) | ORAL | 0 refills | Status: DC | PRN

## 2017-03-15 NOTE — Patient Instructions (Signed)
     IF you received an x-ray today, you will receive an invoice from [facility]. Please contact [facility] at [phone] with questions or concerns regarding your invoice.   IF you received labwork today, you will receive an invoice from [facility]. Please contact [facility] at [phone] with questions or concerns regarding your invoice.   Our billing staff will not be able to assist you with questions regarding bills from these companies.  You will be contacted with the lab results as soon as they are available. The fastest way to get your results is to activate your My Chart account. Instructions are located on the last page of this paperwork. If you have not heard from us regarding the results in 2 weeks, please contact this office.     

## 2017-03-15 NOTE — Progress Notes (Signed)
PRIMARY CARE AT [facility] [address] [place] [address] [phone]  Date:  03/15/2017   Name:  AKIRE RENNERT   DOB:  11-10-1970   MRN:  [id number]  PCP:  Patient, No Pcp Per    History of Present Illness:  MAGGY WYBLE is a 46 y.o. female patient who presents to PCP with  Chief Complaint  Patient presents with  . Follow-up    med check      Patient notes that she does not feel like a bow string that is going to break.  Though the shortness to anger is still present, she does feel better controlled.  She is at 1800mg  daily at this time.  She reports side effect of feeling fatigued.  She has not had any panic attacks. Libido is not assessed, she says due to "the world series".  She has decreased lability.  Patient Active Problem List   Diagnosis Date Noted  . Acute bronchitis 08/10/2016  . Migraine with aura and with status migrainosus, not intractable 08/10/2016  . Depression 10/14/2014  . Generalized anxiety disorder 10/14/2014    Past Medical History:  Diagnosis Date  . Allergy   . Anxiety   . Depression     Past Surgical History:  Procedure Laterality Date  . CESAREAN SECTION      Social History  Substance Use Topics  . Smoking status: Former Games developer  . Smokeless tobacco: Never Used     Comment: quit 5 years ago  . Alcohol use No    Family History  Problem Relation Age of Onset  . Heart disease Mother   . Hypertension Mother   . Diabetes Father   . Heart disease Father   . Hyperlipidemia Father   . Hypertension Father   . Cancer Maternal Grandmother   . Diabetes Maternal Grandfather   . Stroke Maternal Grandfather   . Heart disease Paternal Grandmother   . Hypertension Paternal Grandmother   . Heart disease Paternal Grandfather     Allergies  Allergen Reactions  . Hydrocodone Nausea And Vomiting    Medication list has been reviewed and updated.  Current Outpatient Prescriptions on File Prior to Visit  Medication Sig Dispense Refill   . rizatriptan ([name]-MLT) 10 MG disintegrating tablet Take 1 tablet (10 mg total) by mouth as needed for migraine. May repeat in 2 hours if needed 20 tablet 5   No current facility-administered medications on file prior to visit.     ROS ROS otherwise unremarkable unless listed above.  Physical Examination: BP 122/72   Pulse 81   Temp 98.1 F (36.7 C) (Oral)   Resp 15   Ht 5' 1.5" (1.562 m)   Wt 147 lb (66.7 kg)   SpO2 98%   BMI 27.33 kg/m  Ideal Body Weight: Weight in (lb) to have BMI = 25: 134.2  Physical Exam  Constitutional: She is oriented to person, place, and time. She appears well-developed and well-nourished. No distress.  HENT:  Head: Normocephalic and atraumatic.  Right Ear: External ear normal.  Left Ear: External ear normal.  Eyes: Pupils are equal, round, and reactive to light. Conjunctivae and EOM are normal.  Cardiovascular: Normal rate, regular rhythm and intact distal pulses.  Exam reveals no friction rub.   No murmur heard. Pulmonary/Chest: Effort normal. No respiratory distress. She has no wheezes.  Neurological: She is alert and oriented to person, place, and time.  Skin: She is not diaphoretic.  Psychiatric: She has a normal mood  and affect. Her behavior is normal.     Assessment and Plan: Flavia ShipperShelley J Layne is a 46 y.o. female who is here today for cc of  Chief Complaint  Patient presents with  . Follow-up    med check    Chronic midline back pain, unspecified back location - Plan: ibuprofen (ADVIL,MOTRIN) 600 MG tablet  Anxiety state - Plan: gabapentin (NEURONTIN) 600 MG tablet  Trena PlattStephanie Yahmir Sokolov, PA-C [facility] 10/27/20189:24 AM

## 2017-03-27 ENCOUNTER — Encounter: Payer: Self-pay | Admitting: Physician Assistant

## 2017-03-27 ENCOUNTER — Ambulatory Visit: Payer: BC Managed Care – PPO | Admitting: Physician Assistant

## 2017-03-27 VITALS — BP 122/76 | HR 85 | Temp 98.3°F | Resp 16 | Ht 61.5 in | Wt 149.0 lb

## 2017-03-27 DIAGNOSIS — R070 Pain in throat: Secondary | ICD-10-CM | POA: Diagnosis not present

## 2017-03-27 DIAGNOSIS — J020 Streptococcal pharyngitis: Secondary | ICD-10-CM

## 2017-03-27 DIAGNOSIS — J029 Acute pharyngitis, unspecified: Secondary | ICD-10-CM

## 2017-03-27 LAB — POCT RAPID STREP A (OFFICE): Rapid Strep A Screen: POSITIVE — AB

## 2017-03-27 MED ORDER — AMOXICILLIN 500 MG PO CAPS: 500.0000 mg | ORAL_CAPSULE | Freq: Two times a day (BID) | ORAL | 0 refills | Status: AC

## 2017-03-27 NOTE — Patient Instructions (Addendum)
Make sure you are hydrating well with water Use ibuprofen or tylenol for your throat pain or fever Please use cepacol lozenges for throat pain.   Strep Throat Strep throat is an infection of the throat. It is caused by germs. Strep throat spreads from person to person because of coughing, sneezing, or close contact. Follow these instructions at home: Medicines  Take over-the-counter and prescription medicines only as told by your doctor.  Take your antibiotic medicine as told by your doctor. Do not stop taking the medicine even if you feel better.  Have family members who also have a sore throat or fever go to a doctor. Eating and drinking  Do not share food, drinking cups, or personal items.  Try eating soft foods until your sore throat feels better.  Drink enough fluid to keep your pee (urine) clear or pale yellow. General instructions  Rinse your mouth (gargle) with a salt-water mixture 3-4 times per day or as needed. To make a salt-water mixture, stir -1 tsp of salt into 1 cup of warm water.  Make sure that all people in your house wash their hands well.  Rest.  Stay home from school or work until you have been taking antibiotics for 24 hours.  Keep all follow-up visits as told by your doctor. This is important. Contact a doctor if:  Your neck keeps getting bigger.  You get a rash, cough, or earache.  You cough up thick liquid that is green, yellow-brown, or bloody.  You have pain that does not get better with medicine.  Your problems get worse instead of getting better.  You have a fever. Get help right away if:  You throw up (vomit).  You get a very bad headache.  You neck hurts or it feels stiff.  You have chest pain or you are short of breath.  You have drooling, very bad throat pain, or changes in your voice.  Your neck is swollen or the skin gets red and tender.  Your mouth is dry or you are peeing less than normal.  You keep feeling more tired  or it is hard to wake up.  Your joints are red or they hurt. This information is not intended to replace advice given to you by your health care provider. Make sure you discuss any questions you have with your health care provider. Document Released: 10/26/2007 Document Revised: 01/06/2016 Document Reviewed: 09/01/2014 Elsevier Interactive Patient Education  2018 [employer]     IF you received an x-ray today, you will receive an invoice from [facility]. Please contact [facility] at [phone] with questions or concerns regarding your invoice.   IF you received labwork today, you will receive an invoice from [facility]. Please contact [facility] at [phone] with questions or concerns regarding your invoice.   Our billing staff will not be able to assist you with questions regarding bills from these companies.  You will be contacted with the lab results as soon as they are available. The fastest way to get your results is to activate your My Chart account. Instructions are located on the last page of this paperwork. If you have not heard from us regarding the results in 2 weeks, please contact this office.

## 2017-03-27 NOTE — Progress Notes (Signed)
[facility] [address], [place] [id number] [phone]0000  Date:  03/27/2017   Name:  Aimee Gray   DOB:  06/15/1970   MRN:  [id number]  PCP:  Patient, No Pcp Per    History of Present Illness:  Aimee Gray is a 46 y.o. female patient who presents to PCP with  Chief Complaint  Patient presents with  . Sore Throat    x 3 days     3days of sore throat with pain radiating to the ears.  She has noted no fever. She has no cough or congestion.   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  She has done nothing for her pain.   Pain with swallowing, but no sob or dyspnea.    Patient Active Problem List   Diagnosis Date Noted  . Acute bronchitis 08/10/2016  . Migraine with aura and with status migrainosus, not intractable 08/10/2016  . Depression 10/14/2014  . Generalized anxiety disorder 10/14/2014    Past Medical History:  Diagnosis Date  . Allergy   . Anxiety   . Depression     Past Surgical History:  Procedure Laterality Date  . CESAREAN SECTION      Social History   Tobacco Use  . Smoking status: Former Games developermoker  . Smokeless tobacco: Never Used  . Tobacco comment: quit 5 years ago  Substance Use Topics  . Alcohol use: No  . Drug use: No    Family History  Problem Relation Age of Onset  . Heart disease Mother   . Hypertension Mother   . Diabetes Father   . Heart disease Father   . Hyperlipidemia Father   . Hypertension Father   . Cancer Maternal Grandmother   . Diabetes Maternal Grandfather   . Stroke Maternal Grandfather   . Heart disease Paternal Grandmother   . Hypertension Paternal Grandmother   . Heart disease Paternal Grandfather     Allergies  Allergen Reactions  . Hydrocodone Nausea And Vomiting    Medication list has been reviewed and updated.  Current Outpatient Medications on File Prior to Visit  Medication Sig Dispense Refill  . gabapentin (NEURONTIN) 600 MG tablet Take 1.5 tablets (900 mg  total) by mouth 2 (two) times daily. 90 tablet 1  . ibuprofen (ADVIL,MOTRIN) 600 MG tablet Take 1 tablet (600 mg total) by mouth every 8 (eight) hours as needed. 30 tablet 0  . rizatriptan ([name]-MLT) 10 MG disintegrating tablet Take 1 tablet (10 mg total) by mouth as needed for migraine. May repeat in 2 hours if needed 20 tablet 5   No current facility-administered medications on file prior to visit.     ROS ROS otherwise unremarkable unless listed above.  Physical Examination: BP 122/76   Pulse 85   Temp 98.3 F (36.8 C) (Oral)   Resp 16   Ht 5' 1.5" (1.562 m)   Wt 149 lb (67.6 kg)   SpO2 100%   BMI 27.70 kg/m  Ideal Body Weight: Weight in (lb) to have BMI = 25: 134.2  Physical Exam  Constitutional: She is oriented to person, place, and time. She appears well-developed and well-nourished. No distress.  HENT:  Head: Normocephalic and atraumatic.  Right Ear: Hearing, tympanic membrane, external ear and ear canal normal.  Left Ear: Hearing, tympanic membrane, external ear and ear canal normal.  Mouth/Throat: Uvula swelling present. Posterior oropharyngeal edema and posterior oropharyngeal erythema present. No oropharyngeal exudate.  Eyes: Conjunctivae and  EOM are normal. Pupils are equal, round, and reactive to light.  Cardiovascular: Normal rate.  Pulmonary/Chest: Effort normal. No respiratory distress.  Lymphadenopathy:    She has no cervical adenopathy.  Neurological: She is alert and oriented to person, place, and time.  Skin: Skin is warm and dry. She is not diaphoretic.  Psychiatric: She has a normal mood and affect. Her behavior is normal.     Assessment and Plan: Aimee Gray is a 46 y.o. female who is here today for cc of  Chief Complaint  Patient presents with  . Sore Throat    x 3 days  rtc as needed. Streptococcal sore throat - Plan: POCT rapid strep A, amoxicillin (AMOXIL) 500 MG capsule  Sore throat - Plan: POCT rapid strep A  Throat pain - Plan:  POCT rapid strep A  Trena Platt, PA-C [facility] 11/5/20184:10 PM

## 2017-03-31 ENCOUNTER — Telehealth: Payer: Self-pay | Admitting: Physician Assistant

## 2017-03-31 NOTE — Telephone Encounter (Signed)
Copied from CRM [id number]. Topic: Quick Communication - See Telephone Encounter >> Mar 31, 2017  8:36 AM Diana EvesHoyt, Maryann B wrote: CRM for notification. See Telephone encounter for:  Pt had strep throat earlier this week and now having bad headaches for the past 2 days and back pain throat feels better. Pt just doesn't feel well. Wanting to know nexts needing to be taken if there is anything. If pt doesn't answer phone ok to give detailed message on VM.   03/31/17.

## 2017-04-03 NOTE — Telephone Encounter (Signed)
Left detailed message.  Patient was prescribed 600 advil 10-26 can take that for backpain.   Does she feel she needs something stronger, I can send a message on to [place] to see if she wants her to come back in to be seen, for headache and backpain.

## 2017-04-17 ENCOUNTER — Encounter: Payer: Self-pay | Admitting: Physician Assistant

## 2017-04-17 ENCOUNTER — Ambulatory Visit (INDEPENDENT_AMBULATORY_CARE_PROVIDER_SITE_OTHER): Payer: BC Managed Care – PPO | Admitting: Physician Assistant

## 2017-04-17 ENCOUNTER — Other Ambulatory Visit: Payer: Self-pay

## 2017-04-17 VITALS — BP 120/78 | HR 99 | Temp 98.3°F | Resp 16 | Ht 61.5 in | Wt 147.0 lb

## 2017-04-17 DIAGNOSIS — M546 Pain in thoracic spine: Secondary | ICD-10-CM

## 2017-04-17 DIAGNOSIS — F411 Generalized anxiety disorder: Secondary | ICD-10-CM | POA: Diagnosis not present

## 2017-04-17 DIAGNOSIS — F3289 Other specified depressive episodes: Secondary | ICD-10-CM

## 2017-04-17 DIAGNOSIS — Z13220 Encounter for screening for lipoid disorders: Secondary | ICD-10-CM | POA: Diagnosis not present

## 2017-04-17 DIAGNOSIS — Z0000 Encounter for general adult medical examination without abnormal findings: Secondary | ICD-10-CM

## 2017-04-17 DIAGNOSIS — Z23 Encounter for immunization: Secondary | ICD-10-CM

## 2017-04-17 DIAGNOSIS — Z1329 Encounter for screening for other suspected endocrine disorder: Secondary | ICD-10-CM | POA: Diagnosis not present

## 2017-04-17 DIAGNOSIS — Z130 Encounter for screening for diseases of the blood and blood-forming organs and certain disorders involving the immune mechanism: Secondary | ICD-10-CM

## 2017-04-17 DIAGNOSIS — N62 Hypertrophy of breast: Secondary | ICD-10-CM

## 2017-04-17 DIAGNOSIS — G8929 Other chronic pain: Secondary | ICD-10-CM

## 2017-04-17 DIAGNOSIS — Z13228 Encounter for screening for other metabolic disorders: Secondary | ICD-10-CM

## 2017-04-17 MED ORDER — AMITRIPTYLINE HCL 25 MG PO TABS: 25.0000 mg | ORAL_TABLET | Freq: Every day | ORAL | 1 refills | Status: DC

## 2017-04-17 NOTE — Patient Instructions (Addendum)
If you are famotidine, take 20mg  twice per day.  One tablet with every meal.  If you are taking omeprazole, 20mg  once daily is fine for now, and follow the gerd restrictions below.  You do not have to take both kinds of pills at the same time. I would also like you to review this gerd diet.   Food Choices for Gastroesophageal Reflux Disease, Adult When you have gastroesophageal reflux disease (GERD), the foods you eat and your eating habits are very important. Choosing the right foods can help ease your discomfort. What guidelines do I need to follow?  Choose fruits, vegetables, whole grains, and low-fat dairy products.  Choose low-fat meat, fish, and poultry.  Limit fats such as oils, salad dressings, butter, nuts, and avocado.  Keep a food diary. This helps you identify foods that cause symptoms.  Avoid foods that cause symptoms. These may be different for everyone.  Eat small meals often instead of 3 large meals a day.  Eat your meals slowly, in a place where you are relaxed.  Limit fried foods.  Cook foods using methods other than frying.  Avoid drinking alcohol.  Avoid drinking large amounts of liquids with your meals.  Avoid bending over or lying down until 2-3 hours after eating. What foods are not recommended? These are some foods and drinks that may make your symptoms worse: Vegetables Tomatoes. Tomato juice. Tomato and spaghetti sauce. Chili peppers. Onion and garlic. Horseradish. Fruits Oranges, grapefruit, and lemon (fruit and juice). Meats High-fat meats, fish, and poultry. This includes hot dogs, ribs, ham, sausage, salami, and bacon. Dairy Whole milk and chocolate milk. Sour cream. Cream. Butter. Ice cream. Cream cheese. Drinks Coffee and tea. Bubbly (carbonated) drinks or energy drinks. Condiments Hot sauce. Barbecue sauce. Sweets/Desserts Chocolate and cocoa. Donuts. Peppermint and spearmint. Fats and Oils High-fat foods. This includes [place] fries and  potato chips. Other Vinegar. Strong spices. This includes black pepper, white pepper, red pepper, cayenne, curry powder, cloves, ginger, and chili powder. The items listed above may not be a complete list of foods and drinks to avoid. Contact your dietitian for more information. This information is not intended to replace advice given to you by your health care provider. Make sure you discuss any questions you have with your health care provider. Document Released: 11/08/2011 Document Revised: 10/15/2015 Document Reviewed: 03/13/2013 Elsevier Interactive Patient Education  2017 [employer].   [facility] [address] [place], [place] [address]   Shanon Rosser [phone]  Maris Berger [phone]  Marco Collie [phone]   [facility] ([address] [place]an Amato Beckey Rutter Joycelyn Schmid [place]) - [phone]  [facility] [employer] [place]) - [phone]  [facility] - [phone]  [facility] - [phone]  [facility] - [phone]  [facility]  - [phone] (do not file insurance)  Keeping You Healthy  Get These Tests 1. Blood Pressure- Have your blood pressure checked once a year by your health care provider.  Normal blood pressure is 120/80. 2. Weight- Have your body mass index (BMI) calculated to screen for obesity.  BMI is measure of body fat based on height and weight.  You can also calculate your own BMI at https://www.west-esparza.com/. 3. Cholesterol- Have your cholesterol checked every 5 years starting at age 71 then yearly starting at age 19. 4. Chlamydia, HIV, and other sexually transmitted diseases- Get screened every year until age 20, then within three months of each new sexual provider. 5. Pap  Test - Every 1-5 years; discuss with your health care provider. 6. Mammogram- Every 1-2 years starting at age 44--50  Take these  medicines  Calcium with Vitamin D-Your body needs 1200 mg of Calcium each day and [id number] IU of Vitamin D daily.  Your body can only absorb 500 mg of Calcium at a time so Calcium must be taken in 2 or 3 divided doses throughout the day.  Multivitamin with folic acid- Once daily if it is possible for you to become pregnant.  Get these Immunizations  Gardasil-Series of three doses; prevents HPV related illness such as genital warts and cervical cancer.  Menactra-Single dose; prevents meningitis.  Tetanus shot- Every 10 years.  Flu shot-Every year.  Take these steps 1. Do not smoke-Your healthcare provider can help you quit.  For tips on how to quit go to www.smokefree.gov or call 1-800 QUITNOW. 2. Be physically active- Exercise 5 days a week for at least 30 minutes.  If you are not already physically active, start slow and gradually work up to 30 minutes of moderate physical activity.  Examples of moderate activity include walking briskly, dancing, swimming, bicycling, etc. 3. Breast Cancer- A self breast exam every month is important for early detection of breast cancer.  For more information and instruction on self breast exams, ask your healthcare provider or [place].eswww.womenshealth.gov/faq/breast-self-exam.cfm. 4. Eat a healthy diet- Eat a variety of healthy foods such as fruits, vegetables, whole grains, low fat milk, low fat cheeses, yogurt, lean meats, poultry and fish, beans, nuts, tofu, etc.  For more information go to www. Thenutritionsource.org 5. Drink alcohol in moderation- Limit alcohol intake to one drink or less per day. Never drink and drive. 6. Depression- Your emotional health is as important as your physical health.  If you're feeling down or losing interest in things you normally enjoy please talk to your healthcare provider about being screened for depression. 7. Dental visit- Brush and floss your teeth twice daily; visit your dentist twice a year. 8. Eye doctor- Get an eye exam  at least every 2 years. 9. Helmet use- Always wear a helmet when riding a bicycle, motorcycle, rollerblading or skateboarding. 10. Safe sex- If you may be exposed to sexually transmitted infections, use a condom. 11. Seat belts- Seat belts can save your live; always wear one. 12. Smoke/Carbon Monoxide detectors- These detectors need to be installed on the appropriate level of your home. Replace batteries at least once a year. 13. Skin cancer- When out in the sun please cover up and use sunscreen 15 SPF or higher. 14. Violence- If anyone is threatening or hurting you, please tell your healthcare provider.          IF you received an x-ray today, you will receive an invoice from [facility]. Please contact [facility] at [phone] with questions or concerns regarding your invoice.   IF you received labwork today, you will receive an invoice from [facility]. Please contact [facility] at [phone] with questions or concerns regarding your invoice.   Our billing staff will not be able to assist you with questions regarding bills from these companies.  You will be contacted with the lab results as soon as they are available. The fastest way to get your results is to activate your My Chart account. Instructions are located on the last page of this paperwork. If you have not heard from us regarding the results in 2 weeks, please contact this office.     Amitriptyline; Chlordiazepoxide tablets What is  this medicine? AMITRIPTYLINE; CHLORDIAZEPOXIDE (a mee TRIP ti leen; klor dye az e POX ide) is an anti-depressant and a benzodiazepine. It is used to treat depression with anxiety. This medicine may be used for other purposes; ask your health care provider or pharmacist if you have questions. COMMON BRAND NAME(S): Limbitrol, Limbitrol DS What should I tell my health care provider before I take this medicine? They need to know if you have any of these conditions: -bipolar  disorder or schizophrenia -difficulty passing urine, prostate trouble -glaucoma -heart disease or previous heart attack -kidney or liver disease -over active thyroid -seizures -thoughts or plans of suicide, a previous suicide attempt, or family history of suicide attempt -an unusual or allergic reaction to amitriptyline; chlordiazepoxide, other medicines, foods, dyes, or preservatives -pregnant or trying to get pregnant -breast-feeding How should I use this medicine? Take this medicine by mouth with a drink of water. Follow the directions on the prescription label. Take your medicine at regular intervals. Do not take it more often than directed. Do not stop taking this medicine suddenly except upon the advice of your doctor. Stopping this medicine too quickly may cause serious side effects or your condition may worsen. A special MedGuide will be given to you by the pharmacist with each prescription and refill. Be sure to read this information carefully each time. Talk to your pediatrician regarding the use of this medicine in children. Special care may be needed. Overdosage: If you think you have taken too much of this medicine contact a poison control center or emergency room at once. NOTE: This medicine is only for you. Do not share this medicine with others. What if I miss a dose? If you miss a dose, take it as soon as you can. If it is almost time for your next dose, take only that dose. Do not take double or extra doses. What may interact with this medicine? Do not take this medicine with any of the following medications: -amoxapine -cisapride -dofetilide -dronedarone -linezolid -MAOIs like Carbex, Eldepryl, Marplan, Nardil, and Parnate -maprotiline -methylene blue -narcotic medicines for cough -phenothiazines like mesoridazine and thioridazine -pimozide -quinidine -sodium oxybate -ziprasidone This medicine may also interact with the following  medications: -alcohol -antihistamines for allergy, cough and cold -antiviral medicines for HIV or AIDS -atropine -certain medicines for anxiety or sleep -certain medicines for bladder problems like oxybutynin, tolterodine -certain medicines for depression, like amitriptyline, fluoxetine, sertraline -certain medicines for Parkinson's disease like benztropine, trihexyphenidyl -certain medicines for seizures like carbamazepine, phenobarbital, phenytoin, primidone -certain medicines for stomach problems like dicyclomine, hyoscyamine -certain medicines for travel sickness like scopolamine -cimetidine -disulfiram -general anesthetics like halothane, isoflurane, methoxyflurane, propofol -ipratropium -local anesthetics like lidocaine, pramoxine, tetracaine -medicines that relax muscles for surgery -narcotic medicines for pain -other medicines that prolong the QT interval (cause an abnormal heart rhythm) -other phenothiazines like chlorpromazine and prochlorperazine -thyroid hormones such as levothyroxine This list may not describe all possible interactions. Give your health care provider a list of all the medicines, herbs, non-prescription drugs, or dietary supplements you use. Also tell them if you smoke, drink alcohol, or use illegal drugs. Some items may interact with your medicine. What should I watch for while using this medicine? Tell your doctor if your symptoms do not get better or if they get worse. Do not stop taking except on your doctor's advice. You may develop a severe reaction. Your doctor will tell you how much medicine to take. You may get drowsy or dizzy. Do not drive, use  machinery, or do anything that needs mental alertness until you know how this medicine affects you. Do not stand or sit up quickly, especially if you are an older patient. This reduces the risk of dizzy or fainting spells. Alcohol may interfere with the effect of this medicine. Avoid alcoholic drinks. If you  are taking another medicine that also causes drowsiness, you may have more side effects. Give your health care provider a list of all medicines you use. Your doctor will tell you how much medicine to take. Do not take more medicine than directed. Call emergency for help if you have problems breathing or unusual sleepiness. Your mouth may get dry. Chewing sugarless gum or sucking hard candy, and drinking plenty of water will help. Contact your doctor if the problem does not go away or is severe. This medicine may cause dry eyes and blurred vision. If you wear contact lenses you may feel some discomfort. Lubricating drops may help. See your eye doctor if the problem does not go away or is severe. This medicine can cause constipation. Try to have a bowel movement at least every 2 to 3 days. If you do not have a bowel movement for 3 days, call your doctor or health care professional. This medicine can make you more sensitive to the sun. Keep out of the sun. If you cannot avoid being in the sun, wear protective clothing and use sunscreen. Do not use sun lamps or tanning beds/booths. What side effects may I notice from receiving this medicine? Side effects that you should report to your doctor or health care professional as soon as possible: -allergic reactions like skin rash, itching or hives, swelling of the face, lips, or tongue -breathing problems -changes in vision -confusion -elevated mood, decreased need for sleep, racing thoughts, impulsive behavior -eye pain -fast, irregular heartbeat -feeling faint or lightheaded, falls -feeling agitated, angry, or irritable -fever with increased sweating -hallucination, loss of contact with reality -seizures -stiff muscles -suicidal thoughts or other mood changes -tingling, pain, or numbness in the feet or hands -trouble passing urine or change in the amount of urine -trouble sleeping -unusually weak or tired -vomiting -yellowing of the eyes or  skin Side effects that usually do not require medical attention (report to your doctor or health care professional if they continue or are bothersome): -change in sex drive or performance -change in appetite or weight -constipation -dizziness -dry mouth -nausea -tired -tremors -upset stomach This list may not describe all possible side effects. Call your doctor for medical advice about side effects. You may report side effects to FDA at 1-800-FDA-1088. Where should I keep my medicine? Keep out of the reach of children. This medicine can be abused. Keep your medicine in a safe place to protect it from theft. Do not share this medicine with anyone. Selling or giving away this medicine is dangerous and against the law. Store at room temperature between 15 and 30 degrees C (59 and 86 degrees C). Protect from moisture. Throw away any unused medicine after the expiration date. NOTE: This sheet is a summary. It may not cover all possible information. If you have questions about this medicine, talk to your doctor, pharmacist, or health care provider.  2018 Elsevier/Gold Standard (2015-10-09 12:18:09)

## 2017-04-17 NOTE — Progress Notes (Signed)
PRIMARY CARE AT [facility] [address] [place] [address] [phone]  Date:  04/17/2017   Name:  Aimee Gray   DOB:  November 16, 1970   MRN:  [id number]  PCP:  Patient, No Pcp Per    History of Present Illness:  Aimee Gray is a 46 y.o. female patient who presents to PCP with  Chief Complaint  Patient presents with  . Annual Exam    DIET: she is newly stopping the coke and ice-cream due to some symptoms of reflux.  She will generally consume yogurt, chicken and vegetables, and pastas.  Water intake: 16 oz per day.  Drinks coca-cola and 1 cup of coffee.    BM: no black or bloody stool.  Some diarrhea.  URINATION: frequent.  No dysuria, hematuria  SLEEP: she generally does not sleep as well.  She has a current cough.    SOCIAL ACTIVITY:  She has 2 children.  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  One of her children has special needs.   She walks her dogs and hangs with children for fun.  She also likes to travel.  She joined the [employer].    Depression and anxiety: she felt like she was having trouble with depression.  Felt a symptom of "peanuts in throat".  Stopped the gabapentin as concern that this was causing this.  No jaw clenching or other side effects.  She has noted a history or reflux, and started to take anti-acid which helped her symptoms. Decreased soda and late night snack intake which has also reduced and now resolved these symptoms.  She felt the anxiety was better on the gabapentin, but still feels very depressed. Avoiding ssri due to decreased libido which is valuable to her and her marriage.  wellbutrin had some added side effects which she did not like.  She has never tried amitriptyline to her knowledge.  Patient Active Problem List   Diagnosis Date Noted  . Acute bronchitis 08/10/2016  . Migraine with aura and with status migrainosus, not intractable 08/10/2016  . Depression 10/14/2014  . Generalized anxiety disorder 10/14/2014    Past Medical  History:  Diagnosis Date  . Allergy   . Anxiety   . Depression     Past Surgical History:  Procedure Laterality Date  . CESAREAN SECTION      Social History   Tobacco Use  . Smoking status: Former Research scientist (life sciences)  . Smokeless tobacco: Never Used  . Tobacco comment: quit 5 years ago  Substance Use Topics  . Alcohol use: No  . Drug use: No    Family History  Problem Relation Age of Onset  . Heart disease Mother   . Hypertension Mother   . Diabetes Father   . Heart disease Father   . Hyperlipidemia Father   . Hypertension Father   . Cancer Maternal Grandmother   . Diabetes Maternal Grandfather   . Stroke Maternal Grandfather   . Heart disease Paternal Grandmother   . Hypertension Paternal Grandmother   . Heart disease Paternal Grandfather     Allergies  Allergen Reactions  . Hydrocodone Nausea And Vomiting    Medication list has been reviewed and updated.  Current Outpatient Medications on File Prior to Visit  Medication Sig Dispense Refill  . gabapentin (NEURONTIN) 600 MG tablet Take 1.5 tablets (900 mg total) by mouth 2 (two) times daily. 90 tablet 1  . ibuprofen (ADVIL,MOTRIN) 600 MG tablet Take 1 tablet (600 mg total) by mouth every  8 (eight) hours as needed. (Patient not taking: Reported on 04/17/2017) 30 tablet 0  . rizatriptan ([name]-MLT) 10 MG disintegrating tablet Take 1 tablet (10 mg total) by mouth as needed for migraine. May repeat in 2 hours if needed 20 tablet 5   No current facility-administered medications on file prior to visit.     Review of Systems  Constitutional: Positive for chills.  Respiratory: Positive for cough (temporary).   Neurological: Positive for headaches (history of migraine).  Psychiatric/Behavioral: The patient is nervous/anxious and has insomnia.    ROS otherwise unremarkable unless listed above.  Physical Examination: BP 120/78   Pulse 99   Temp 98.3 F (36.8 C) (Oral)   Resp 16   Ht 5' 1.5" (1.562 m)   Wt 147 lb (66.7  kg)   SpO2 99%   BMI 27.33 kg/m  Ideal Body Weight: Weight in (lb) to have BMI = 25: 134.2  Physical Exam  Constitutional: She is oriented to person, place, and time. She appears well-developed and well-nourished. No distress.  HENT:  Head: Normocephalic and atraumatic.  Right Ear: Tympanic membrane, external ear and ear canal normal.  Left Ear: Tympanic membrane, external ear and ear canal normal.  Nose: Right sinus exhibits no maxillary sinus tenderness and no frontal sinus tenderness. Left sinus exhibits no maxillary sinus tenderness and no frontal sinus tenderness.  Mouth/Throat: Oropharynx is clear and moist. No uvula swelling. No oropharyngeal exudate, posterior oropharyngeal edema or posterior oropharyngeal erythema.  Eyes: Conjunctivae and EOM are normal. Pupils are equal, round, and reactive to light.  Neck: Normal range of motion. Neck supple. No thyromegaly present.  Cardiovascular: Normal rate, regular rhythm, normal heart sounds and intact distal pulses. Exam reveals no gallop, no distant heart sounds and no friction rub.  No murmur heard. Pulmonary/Chest: Effort normal and breath sounds normal. No respiratory distress. She has no decreased breath sounds. She has no wheezes. She has no rhonchi.  Breast are dense and weighty.  37 cm in length from proximal clavicle to breast end, bilateral  Abdominal: Soft. Bowel sounds are normal. She exhibits no distension and no mass. There is no tenderness.  Musculoskeletal: Normal range of motion. She exhibits no edema or tenderness.  Lymphadenopathy:       Head (right side): No submandibular, no tonsillar, no preauricular and no posterior auricular adenopathy present.       Head (left side): No submandibular, no tonsillar, no preauricular and no posterior auricular adenopathy present.    She has no cervical adenopathy.  Neurological: She is alert and oriented to person, place, and time. No cranial nerve deficit. She exhibits normal muscle  tone. Coordination normal.  Skin: Skin is warm and dry. She is not diaphoretic.  Psychiatric: She has a normal mood and affect. Her behavior is normal.   37cm. Bilaterally of the breast strength.  34I    Assessment and Plan: JIAH BARI is a 46 y.o. female who is here today for cc of  Chief Complaint  Patient presents with  . Annual Exam   will obtian pap with gynecologist.  She continues to have some back pain and overall qol is limited with her breast size.  These are very large for her small frame.  She plans to pick up a trainer and has started her [employer] membership.  nsaid use is present as needed.  We will follow up on this, with plans to see plastic if no improvement established with symptoms.   The use of opiates  is not encouraged with her depression and anxiety.  Surgical intervention is the best choice in terms of not creating an addiction and other complexed layered issues.  However, we will see if core work and strengthening my improve symptoms.  Stopping the gabapentin.  Starting amitriptyline which can increase by 50 every 3 days with maximum at 158m.  Follow up in 4 weeks.  She can contact me via mychart/phone if there is adversity to medication. Annual physical exam - Plan: CBC, CMP14+EGFR, Lipid panel, TSH  Need for influenza vaccination - Plan: Flu Vaccine QUAD 36+ mos IM  Screening for deficiency anemia - Plan: CBC  Screening for metabolic disorder - Plan: CMP14+EGFR  Screening for lipid disorders - Plan: Lipid panel  Screening for thyroid disorder - Plan: TSH  Large breasts  Chronic midline thoracic back pain  Generalized anxiety disorder - Plan: amitriptyline (ELAVIL) 25 MG tablet  Other depression - Plan: amitriptyline (ELAVIL) 25 MG tablet  SIvar Drape PA-C [facility] 11/27/20189:59 AM

## 2017-04-18 ENCOUNTER — Encounter: Payer: Self-pay | Admitting: Physician Assistant

## 2017-04-18 LAB — LIPID PANEL
CHOL/HDL RATIO: 3.4 ratio (ref 0.0–4.4)
Cholesterol, Total: 144 mg/dL (ref 100–199)
HDL: 42 mg/dL (ref >39)
LDL Calculated: 90 mg/dL (ref 0–99)
Triglycerides: 60 mg/dL (ref 0–149)
VLDL CHOLESTEROL CAL: 12 mg/dL (ref 5–40)

## 2017-04-18 LAB — CMP14+EGFR
A/G RATIO: 2.6 — AB (ref 1.2–2.2)
ALBUMIN: 4.7 g/dL (ref 3.5–5.5)
ALT: 13 IU/L (ref 0–32)
AST: 13 IU/L (ref 0–40)
Alkaline Phosphatase: 50 IU/L (ref 39–117)
BILIRUBIN TOTAL: 0.5 mg/dL (ref 0.0–1.2)
BUN / CREAT RATIO: 13 (ref 9–23)
BUN: 9 mg/dL (ref 6–24)
CHLORIDE: 101 mmol/L (ref 96–106)
CO2: 22 mmol/L (ref 20–29)
Calcium: 9.4 mg/dL (ref 8.7–10.2)
Creatinine, Ser: 0.71 mg/dL (ref 0.57–1.00)
GFR, EST AFRICAN AMERICAN: 120 mL/min/{1.73_m2} (ref >59)
GFR, EST NON AFRICAN AMERICAN: 104 mL/min/{1.73_m2} (ref >59)
GLUCOSE: 76 mg/dL (ref 65–99)
Globulin, Total: 1.8 g/dL (ref 1.5–4.5)
POTASSIUM: 3.7 mmol/L (ref 3.5–5.2)
Sodium: 138 mmol/L (ref 134–144)
Total Protein: 6.5 g/dL (ref 6.0–8.5)

## 2017-04-18 LAB — CBC
Hematocrit: 41.3 % (ref 34.0–46.6)
Hemoglobin: 14.1 g/dL (ref 11.1–15.9)
MCH: 30.7 pg (ref 26.6–33.0)
MCHC: 34.1 g/dL (ref 31.5–35.7)
MCV: 90 fL (ref 79–97)
PLATELETS: 373 10*3/uL (ref 150–379)
RBC: 4.59 x10E6/uL (ref 3.77–5.28)
RDW: 13.4 % (ref 12.3–15.4)
WBC: 9.4 10*3/uL (ref 3.4–10.8)

## 2017-04-18 LAB — TSH: TSH: 2.3 u[IU]/mL (ref 0.450–4.500)

## 2017-05-26 ENCOUNTER — Other Ambulatory Visit: Payer: Self-pay | Admitting: Physician Assistant

## 2017-05-26 DIAGNOSIS — F3289 Other specified depressive episodes: Secondary | ICD-10-CM

## 2017-05-26 DIAGNOSIS — F411 Generalized anxiety disorder: Secondary | ICD-10-CM

## 2017-06-21 ENCOUNTER — Ambulatory Visit: Payer: BC Managed Care – PPO | Admitting: Physician Assistant

## 2017-06-21 ENCOUNTER — Encounter: Payer: Self-pay | Admitting: Physician Assistant

## 2017-06-21 VITALS — BP 122/72 | HR 113 | Temp 98.1°F | Resp 17 | Ht 61.5 in | Wt 151.0 lb

## 2017-06-21 DIAGNOSIS — R05 Cough: Secondary | ICD-10-CM | POA: Diagnosis not present

## 2017-06-21 DIAGNOSIS — J0140 Acute pansinusitis, unspecified: Secondary | ICD-10-CM

## 2017-06-21 DIAGNOSIS — R059 Cough, unspecified: Secondary | ICD-10-CM

## 2017-06-21 MED ORDER — CODEINE POLT-CHLORPHEN POLT ER 14.7-2.8 MG/5ML PO SUER: 10.0000 mL | Freq: Two times a day (BID) | ORAL | 0 refills | Status: DC | PRN

## 2017-06-21 MED ORDER — DOXYCYCLINE HYCLATE 100 MG PO CAPS: 100.0000 mg | ORAL_CAPSULE | Freq: Two times a day (BID) | ORAL | 0 refills | Status: AC

## 2017-06-21 NOTE — Patient Instructions (Addendum)
Make sure that you are hydrating well with 64 oz of water if not more. You can use normal saline.   You can use tylenol or ibuprofen for pain or fever.  You can take mucinex as well.    Sinusitis, Adult Sinusitis is soreness and inflammation of your sinuses. Sinuses are hollow spaces in the bones around your face. They are located:  Around your eyes.  In the middle of your forehead.  Behind your nose.  In your cheekbones.  Your sinuses and nasal passages are lined with a stringy fluid (mucus). Mucus normally drains out of your sinuses. When your nasal tissues get inflamed or swollen, the mucus can get trapped or blocked so air cannot flow through your sinuses. This lets bacteria, viruses, and funguses grow, and that leads to infection. Follow these instructions at home: Medicines  Take, use, or apply over-the-counter and prescription medicines only as told by your doctor. These may include nasal sprays.  If you were prescribed an antibiotic medicine, take it as told by your doctor. Do not stop taking the antibiotic even if you start to feel better. Hydrate and Humidify  Drink enough water to keep your pee (urine) clear or pale yellow.  Use a cool mist humidifier to keep the humidity level in your home above 50%.  Breathe in steam for 10-15 minutes, 3-4 times a day or as told by your doctor. You can do this in the bathroom while a hot shower is running.  Try not to spend time in cool or dry air. Rest  Rest as much as possible.  Sleep with your head raised (elevated).  Make sure to get enough sleep each night. General instructions  Put a warm, moist washcloth on your face 3-4 times a day or as told by your doctor. This will help with discomfort.  Wash your hands often with soap and water. If there is no soap and water, use hand sanitizer.  Do not smoke. Avoid being around people who are smoking (secondhand smoke).  Keep all follow-up visits as told by your doctor. This is  important. Contact a doctor if:  You have a fever.  Your symptoms get worse.  Your symptoms do not get better within 10 days. Get help right away if:  You have a very bad headache.  You cannot stop throwing up (vomiting).  You have pain or swelling around your face or eyes.  You have trouble seeing.  You feel confused.  Your neck is stiff.  You have trouble breathing. This information is not intended to replace advice given to you by your health care provider. Make sure you discuss any questions you have with your health care provider. Document Released: 10/26/2007 Document Revised: 01/03/2016 Document Reviewed: 03/04/2015 Elsevier Interactive Patient Education  2018 [employer]     IF you received an x-ray today, you will receive an invoice from [facility]. Please contact [facility] at [phone] with questions or concerns regarding your invoice.   IF you received labwork today, you will receive an invoice from [facility]. Please contact [facility] at [phone] with questions or concerns regarding your invoice.   Our billing staff will not be able to assist you with questions regarding bills from these companies.  You will be contacted with the lab results as soon as they are available. The fastest way to get your results is to activate your My Chart account. Instructions are located on the last page of this paperwork. If you have not heard from  [name] regarding the results in 2 weeks, please contact this office.

## 2017-06-22 ENCOUNTER — Telehealth: Payer: Self-pay

## 2017-06-22 ENCOUNTER — Telehealth: Payer: Self-pay | Admitting: Family Medicine

## 2017-06-22 MED ORDER — GUAIFENESIN-CODEINE 100-10 MG/5ML PO SYRP: 5.0000 mL | ORAL_SOLUTION | Freq: Three times a day (TID) | ORAL | 0 refills | Status: DC | PRN

## 2017-06-22 NOTE — Telephone Encounter (Addendum)
I will order the cheratussin.  However, I did give her a card for the [place]ra  Was this not 25.00--please ask patient if she answers.  I thought this might be good for her.

## 2017-06-22 NOTE — Telephone Encounter (Signed)
Tuzistra XR too expensive-Pharm rec Chertussin Please advise

## 2017-06-22 NOTE — Addendum Note (Signed)
Addended by: Trena PlattENGLISH, STEPHANIE D on: 06/22/2017 01:41 PM   Modules accepted: Orders

## 2017-06-22 NOTE — Telephone Encounter (Signed)
Message sent to Dr. Stallings 

## 2017-06-22 NOTE — Telephone Encounter (Signed)
Copied from CRM [id number]. Topic: Quick Communication - See Telephone Encounter >> Jun 22, 2017 10:11 AM Trula SladeWalter, Linda F wrote: CRM for notification. See Telephone encounter for:  06/22/17. T[facility] w[facility] [address] stated the Codeine Polt-Chlorphen Polt ER (TUZISTRA XR) 14.7-2.8 MG/5ML SURE medication that was prescribed for the patient was much to expensive for her.  The pharmacist recommends  Chertussin because even if the patient's insurance will not accept it, it's not very expensive.

## 2017-06-23 NOTE — Telephone Encounter (Signed)
I spoke to the patient and she stated that pharmacy called the company and they said the coupon expired in April and the medication would have cost her $100 and that was too expensive. She did however get the Cheratussin and took it last night. She stated it worked very well and Thanks so much

## 2017-06-30 NOTE — Progress Notes (Signed)
[facility] [address], [place] [id number] [phone]  Date:  06/21/2017   Name:  Aimee ShipperShelley J Gray   DOB:  [birth year]   MRN:  [id number]  PCP:  Doristine BosworthStallings, Zoe A, MD    History of Present Illness:  Aimee Gray is a 47 y.o. female patient who presents to PCP with  Chief Complaint  Patient presents with  . Sinusitis     10 days of sinus congestion and intermittent runny nose.  She has thickened mucus.  Subjective fever and chills.  She feels that she is getting worse.  She is taking her allergy meds.  Cough is minimal.    Patient Active Problem List   Diagnosis Date Noted  . Acute bronchitis 08/10/2016  . Migraine with aura and with status migrainosus, not intractable 08/10/2016  . Depression 10/14/2014  . Generalized anxiety disorder 10/14/2014    Past Medical History:  Diagnosis Date  . Allergy   . Anxiety   . Depression     Past Surgical History:  Procedure Laterality Date  . CESAREAN SECTION      Social History   Tobacco Use  . Smoking status: Former Games developermoker  . Smokeless tobacco: Never Used  . Tobacco comment: quit 5 years ago  Substance Use Topics  . Alcohol use: No  . Drug use: No    Family History  Problem Relation Age of Onset  . Heart disease Mother   . Hypertension Mother   . Diabetes Father   . Heart disease Father   . Hyperlipidemia Father   . Hypertension Father   . Cancer Maternal Grandmother   . Diabetes Maternal Grandfather   . Stroke Maternal Grandfather   . Heart disease Paternal Grandmother   . Hypertension Paternal Grandmother   . Heart disease Paternal Grandfather     Allergies  Allergen Reactions  . Hydrocodone Nausea And Vomiting    Medication list has been reviewed and updated.  Current Outpatient Medications on File Prior to Visit  Medication Sig Dispense Refill  . amitriptyline (ELAVIL) 25 MG tablet PLEASE SEE ATTACHED FOR DETAILED DIRECTIONS 60 tablet 1  . ibuprofen (ADVIL,MOTRIN) 600 MG tablet  Take 1 tablet (600 mg total) by mouth every 8 (eight) hours as needed. 30 tablet 0  . rizatriptan ([name]-MLT) 10 MG disintegrating tablet Take 1 tablet (10 mg total) by mouth as needed for migraine. May repeat in 2 hours if needed 20 tablet 5  . gabapentin (NEURONTIN) 600 MG tablet Take 1.5 tablets (900 mg total) by mouth 2 (two) times daily. (Patient not taking: Reported on 06/21/2017) 90 tablet 1   No current facility-administered medications on file prior to visit.     ROS ROS otherwise unremarkable unless listed above.  Physical Examination: BP 122/72   Pulse (!) 113   Temp 98.1 F (36.7 C) (Oral)   Resp 17   Ht 5' 1.5" (1.562 m)   Wt 151 lb (68.5 kg)   SpO2 98%   BMI 28.07 kg/m  Ideal Body Weight: Weight in (lb) to have BMI = 25: 134.2  Physical Exam  Constitutional: She is oriented to person, place, and time. She appears well-developed and well-nourished. No distress.  HENT:  Head: Normocephalic and atraumatic.  Right Ear: Tympanic membrane, external ear and ear canal normal.  Left Ear: Tympanic membrane, external ear and ear canal normal.  Nose: Mucosal edema and rhinorrhea present. Right sinus exhibits maxillary sinus tenderness. Right sinus exhibits no frontal sinus tenderness. Left  sinus exhibits maxillary sinus tenderness. Left sinus exhibits no frontal sinus tenderness.  Mouth/Throat: No uvula swelling. No oropharyngeal exudate, posterior oropharyngeal edema or posterior oropharyngeal erythema.  Eyes: Conjunctivae and EOM are normal. Pupils are equal, round, and reactive to light.  Cardiovascular: Normal rate and regular rhythm. Exam reveals no gallop, no distant heart sounds and no friction rub.  No murmur heard. Pulmonary/Chest: Effort normal. No respiratory distress. She has no decreased breath sounds. She has no wheezes. She has no rhonchi.  Lymphadenopathy:       Head (right side): No submandibular, no tonsillar, no preauricular and no posterior auricular  adenopathy present.       Head (left side): No submandibular, no tonsillar, no preauricular and no posterior auricular adenopathy present.  Neurological: She is alert and oriented to person, place, and time.  Skin: She is not diaphoretic.  Psychiatric: She has a normal mood and affect. Her behavior is normal.     Assessment and Plan: DESSIRAE SCAROLA is a 47 y.o. female who is here today for cc of  Chief Complaint  Patient presents with  . Sinusitis     1. Acute non-recurrent pansinusitis - Codeine Polt-Chlorphen Polt ER (TUZISTRA XR) 14.7-2.8 MG/5ML SUER; Take 10 mLs by mouth every 12 (twelve) hours as needed.  Dispense: 110 mL; Refill: 0 - doxycycline (VIBRAMYCIN) 100 MG capsule; Take 1 capsule (100 mg total) by mouth 2 (two) times daily for 7 days.  Dispense: 14 capsule; Refill: 0  2. Cough - doxycycline (VIBRAMYCIN) 100 MG capsule; Take 1 capsule (100 mg total) by mouth 2 (two) times daily for 7 days.  Dispense: 14 capsule; Refill: 0   Trena Platt, PA-C [facility] 06/30/2017 10:25 AM

## 2017-07-27 ENCOUNTER — Other Ambulatory Visit: Payer: Self-pay | Admitting: Physician Assistant

## 2017-07-27 DIAGNOSIS — F3289 Other specified depressive episodes: Secondary | ICD-10-CM

## 2017-07-27 DIAGNOSIS — F411 Generalized anxiety disorder: Secondary | ICD-10-CM

## 2017-08-12 ENCOUNTER — Other Ambulatory Visit: Payer: Self-pay | Admitting: Physician Assistant

## 2017-08-12 DIAGNOSIS — G43101 Migraine with aura, not intractable, with status migrainosus: Secondary | ICD-10-CM

## 2017-08-14 NOTE — Telephone Encounter (Signed)
rizatriptan refill Last OV: 04/17/17 Last Refill: 06/10/17 [facility] [address] [place]

## 2017-08-17 ENCOUNTER — Other Ambulatory Visit: Payer: Self-pay | Admitting: Physician Assistant

## 2017-08-17 DIAGNOSIS — G43101 Migraine with aura, not intractable, with status migrainosus: Secondary | ICD-10-CM

## 2017-08-17 NOTE — Telephone Encounter (Signed)
Patient called, left detailed VM that an office visit will be needed per note of Dr. Creta LevinStallings at the last OV 03/2017 for a 4 week follow up. Advised to call the office to schedule an appointment in order to get the medication refilled.

## 2017-08-18 NOTE — Telephone Encounter (Signed)
Patient called, left VM to call the office to schedule an appointment, which needs to be done before filling medication.

## 2017-08-30 ENCOUNTER — Encounter: Payer: Self-pay | Admitting: Physician Assistant

## 2018-06-15 ENCOUNTER — Other Ambulatory Visit: Payer: Self-pay | Admitting: Family Medicine

## 2018-06-15 DIAGNOSIS — G43101 Migraine with aura, not intractable, with status migrainosus: Secondary | ICD-10-CM

## 2018-06-15 NOTE — Telephone Encounter (Signed)
Copied from CRM [id number]. Topic: Quick Communication - Rx Refill/Question >> Jun 15, 2018  8:32 AM Gaynelle Adu wrote: Medication: rizatriptan ([name]-MLT) 10 MG disintegrating tablet  Has the patient contacted their pharmacy? Yes   Preferred Pharmacy (with phone number [facility] street [phone] [place] [place] - [address] [phone] (Phone) [phone] (Fax)   Patient has an appt schedule for 08-25-2018 with Creta Levin, Requesting a RX until the Schedule Appt. Please advise   Agent: Please be advised that RX refills may take up to 3 business days. We ask that you follow-up with your pharmacy.

## 2018-06-15 NOTE — Telephone Encounter (Signed)
pls see note 

## 2018-06-19 ENCOUNTER — Telehealth: Payer: Self-pay | Admitting: Family Medicine

## 2018-06-19 NOTE — Telephone Encounter (Signed)
Called and spoke with pt regarding their appt scheduled with Dr. Creta LevinStallings on 08/06/18. Due to Dr. Creta LevinStallings being out of the office, pt was needing to be rescheduled. I was able to get pt rescheduled to 07/25/18 at 3:40. I advised of time, building number and late policy. Pt acknowledged.   Pt was inquiring about her request for a med that she put in on Friday. I advised that the clinical staff would look at it and the pharmacy would call if they could refill it or we would call if she needed an appt. Pt acknowledged.

## 2018-06-21 MED ORDER — RIZATRIPTAN BENZOATE 10 MG PO TBDP: ORAL_TABLET | ORAL | 0 refills | Status: DC

## 2018-06-21 NOTE — Telephone Encounter (Signed)
Last patient appt with PCP as January 2019

## 2018-07-25 ENCOUNTER — Encounter: Payer: Self-pay | Admitting: Family Medicine

## 2018-07-25 ENCOUNTER — Other Ambulatory Visit: Payer: Self-pay

## 2018-07-25 ENCOUNTER — Ambulatory Visit: Payer: BC Managed Care – PPO | Admitting: Family Medicine

## 2018-07-25 VITALS — BP 132/85 | HR 76 | Temp 98.0°F | Resp 16 | Ht 60.0 in | Wt 146.0 lb

## 2018-07-25 DIAGNOSIS — G43101 Migraine with aura, not intractable, with status migrainosus: Secondary | ICD-10-CM | POA: Diagnosis not present

## 2018-07-25 DIAGNOSIS — N898 Other specified noninflammatory disorders of vagina: Secondary | ICD-10-CM

## 2018-07-25 DIAGNOSIS — R300 Dysuria: Secondary | ICD-10-CM | POA: Diagnosis not present

## 2018-07-25 DIAGNOSIS — R110 Nausea: Secondary | ICD-10-CM

## 2018-07-25 LAB — POCT URINALYSIS DIP (MANUAL ENTRY)
Bilirubin, UA: NEGATIVE
Blood, UA: NEGATIVE
Glucose, UA: NEGATIVE mg/dL
Ketones, POC UA: NEGATIVE mg/dL
Leukocytes, UA: NEGATIVE
Nitrite, UA: NEGATIVE
Protein Ur, POC: NEGATIVE mg/dL
Spec Grav, UA: 1.015 (ref 1.010–1.025)
Urobilinogen, UA: 0.2 E.U./dL
pH, UA: 7.5 (ref 5.0–8.0)

## 2018-07-25 LAB — POCT WET + KOH PREP
Trich by wet prep: ABSENT
YEAST BY KOH: ABSENT
YEAST BY WET PREP: ABSENT

## 2018-07-25 MED ORDER — METRONIDAZOLE 500 MG PO TABS: 500.0000 mg | ORAL_TABLET | Freq: Two times a day (BID) | ORAL | 0 refills | Status: DC

## 2018-07-25 MED ORDER — RIZATRIPTAN BENZOATE 10 MG PO TBDP: ORAL_TABLET | ORAL | 3 refills | Status: DC

## 2018-07-25 MED ORDER — ONDANSETRON HCL 4 MG PO TABS: 4.0000 mg | ORAL_TABLET | Freq: Three times a day (TID) | ORAL | 0 refills | Status: DC | PRN

## 2018-07-25 MED ORDER — FLUCONAZOLE 150 MG PO TABS: 150.0000 mg | ORAL_TABLET | Freq: Once | ORAL | 0 refills | Status: AC

## 2018-07-25 NOTE — Progress Notes (Signed)
Established Patient Office Visit  Subjective:  Patient ID: Aimee Gray, female    DOB: 1971/02/20  Age: 48 y.o. MRN: [id number]  CC:  Chief Complaint  Patient presents with  . Medication Management    pt need refill on [name]-MLT  . Vaginal Discharge    pt states she was an antibotic and it gave her a yeast infection.     HPI Aimee Gray presents for   History of migraines She reports that she is getting increasing episodes of migraines She reports that the last month was average but they pick up during weather pattern changes especially when the barometric pressure drops She states that she does not get a period due to her IUD Mirena She states that in the past month she has had one episode a week It is typically unilateral with increasing nausea with episodes She states that she does not get nausea every time She reports that sometimes she will be tired and not take the maxalt in time she gets very bad headaches with nausea with vomiting She reports that she had to leave work due to nausea and vomiting   She was treated at [facility] for sinusitis She developed vaginal irritation and vaginal discharge Patient tried otc suppository She reports that she had an antibiotic prior to this  She was on amoxicillin for sinus infection   Past Medical History:  Diagnosis Date  . Allergy   . Anxiety   . Depression     Past Surgical History:  Procedure Laterality Date  . CESAREAN SECTION      Family History  Problem Relation Age of Onset  . Heart disease Mother   . Hypertension Mother   . Diabetes Father   . Heart disease Father   . Hyperlipidemia Father   . Hypertension Father   . Cancer Maternal Grandmother   . Diabetes Maternal Grandfather   . Stroke Maternal Grandfather   . Heart disease Paternal Grandmother   . Hypertension Paternal Grandmother   . Heart disease Paternal Grandfather     Social History   Socioeconomic History  . Marital status: Married      Spouse name: Not on file  . Number of children: Not on file  . Years of education: Not on file  . Highest education level: Not on file  Occupational History  . Not on file  Social Needs  . Financial resource strain: Not on file  . Food insecurity:    Worry: Not on file    Inability: Not on file  . Transportation needs:    Medical: Not on file    Non-medical: Not on file  Tobacco Use  . Smoking status: Former Games developer  . Smokeless tobacco: Never Used  . Tobacco comment: quit 5 years ago  Substance and Sexual Activity  . Alcohol use: No  . Drug use: No  . Sexual activity: Never  Lifestyle  . Physical activity:    Days per week: Not on file    Minutes per session: Not on file  . Stress: Not on file  Relationships  . Social connections:    Talks on phone: Not on file    Gets together: Not on file    Attends religious service: Not on file    Active member of club or organization: Not on file    Attends meetings of clubs or organizations: Not on file    Relationship status: Not on file  . Intimate partner violence:  Fear of current or ex partner: Not on file    Emotionally abused: Not on file    Physically abused: Not on file    Forced sexual activity: Not on file  Other Topics Concern  . Not on file  Social History Narrative  . Not on file    Outpatient Medications Prior to Visit  Medication Sig Dispense Refill  . amitriptyline (ELAVIL) 25 MG tablet TAKE 1 TABLET BY MOUTH AT BEDTIME ,MAY INCREASE BY 50MG  EVERY 3 DAYS. (MAX 100MG  NIGHTLY) 60 tablet 1  . Codeine Polt-Chlorphen Polt ER (TUZISTRA XR) 14.7-2.8 MG/5ML SUER Take 10 mLs by mouth every 12 (twelve) hours as needed. 110 mL 0  . gabapentin (NEURONTIN) 600 MG tablet Take 1.5 tablets (900 mg total) by mouth 2 (two) times daily. 90 tablet 1  . guaiFENesin-codeine (ROBITUSSIN AC) 100-10 MG/5ML syrup Take 5 mLs by mouth 3 (three) times daily as needed for cough. 120 mL 0  . ibuprofen (ADVIL,MOTRIN) 600 MG tablet  Take 1 tablet (600 mg total) by mouth every 8 (eight) hours as needed. 30 tablet 0  . rizatriptan ([name]-MLT) 10 MG disintegrating tablet DISSOLVE 1 TABLET BY MOUTH AS NEEDED FOR MIGRAINE. MAY REPEAT IN 2 HOURS IF NEEDED. NEEDS OFFICE VISIT. 5 tablet 0   No facility-administered medications prior to visit.     Allergies  Allergen Reactions  . Hydrocodone Nausea And Vomiting    ROS Review of Systems    Objective:    Physical Exam  BP 132/85   Pulse 76   Temp 98 F (36.7 C) (Oral)   Resp 16   Ht 5' (1.524 m)   Wt 146 lb (66.2 kg)   SpO2 98%   BMI 28.51 kg/m  Wt Readings from Last 3 Encounters:  07/25/18 146 lb (66.2 kg)  06/21/17 151 lb (68.5 kg)  04/17/17 147 lb (66.7 kg)   Physical Exam  Constitutional: Oriented to person, place, and time. Appears well-developed and well-nourished.  HENT:  Head: Normocephalic and atraumatic.  Eyes: Conjunctivae and EOM are normal.  Cardiovascular: Normal rate, regular rhythm, normal heart sounds and intact distal pulses.  No murmur heard. Pulmonary/Chest: Effort normal and breath sounds normal. No stridor. No respiratory distress. Has no wheezes.  Neurological: Is alert and oriented to person, place, and time.  Skin: Skin is warm. Capillary refill takes less than 2 seconds.  Psychiatric: Has a normal mood and affect. Behavior is normal. Judgment and thought content normal.    Vaginal exam- Chaperone Present Labia normal bilaterally without skin lesions Urethral meatus normal appearing without erythema Vagina with white, thick discharge No CMT, ovaries small and not palpable Uterus midline, nontender   Health Maintenance Due  Topic Date Due  . HIV Screening  11/16/1987  . TETANUS/TDAP  11/16/1991  . PAP SMEAR-Modifier  11/15/1993    There are no preventive care reminders to display for this patient.  Lab Results  Component Value Date   TSH 2.300 04/17/2017   Lab Results  Component Value Date   WBC 9.4 04/17/2017    HGB 14.1 04/17/2017   HCT 41.3 04/17/2017   MCV 90 04/17/2017   PLT 373 04/17/2017   Lab Results  Component Value Date   NA 138 04/17/2017   K 3.7 04/17/2017   CO2 22 04/17/2017   GLUCOSE 76 04/17/2017   BUN 9 04/17/2017   CREATININE 0.71 04/17/2017   BILITOT 0.5 04/17/2017   ALKPHOS 50 04/17/2017   AST 13 04/17/2017   ALT 13  04/17/2017   PROT 6.5 04/17/2017   ALBUMIN 4.7 04/17/2017   CALCIUM 9.4 04/17/2017   Lab Results  Component Value Date   CHOL 144 04/17/2017   Lab Results  Component Value Date   HDL 42 04/17/2017   Lab Results  Component Value Date   LDLCALC 90 04/17/2017   Lab Results  Component Value Date   TRIG 60 04/17/2017   Lab Results  Component Value Date   CHOLHDL 3.4 04/17/2017   No results found for: HGBA1C    Assessment & Plan:   Problem List Items Addressed This Visit      Cardiovascular and Mediastinum   Migraine with aura and with status migrainosus, not intractable - Primary Continue maxalt   Relevant Medications   rizatriptan ([name]-MLT) 10 MG disintegrating tablet    Other Visit Diagnoses    Vaginal discharge    -  Wet prep showing BV  Will give diflucan empirically   Relevant Orders   POCT Wet + KOH Prep (Completed)   Dysuria    -  No UTI   Relevant Orders   POCT urinalysis dipstick (Completed)   Nausea in adult    -  Advised peppermint, ginger and alternatives otc  Only in use nausea and severe migraine   Relevant Medications   ondansetron (ZOFRAN) 4 MG tablet      Meds ordered this encounter  Medications  . ondansetron (ZOFRAN) 4 MG tablet    Sig: Take 1 tablet (4 mg total) by mouth every 8 (eight) hours as needed for nausea or vomiting.    Dispense:  20 tablet    Refill:  0  . rizatriptan ([name]-MLT) 10 MG disintegrating tablet    Sig: DISSOLVE 1 TABLET BY MOUTH AS NEEDED FOR MIGRAINE. MAY REPEAT IN 2 HOURS IF NEEDED. NEEDS OFFICE VISIT.    Dispense:  10 tablet    Refill:  3  . metroNIDAZOLE (FLAGYL) 500  MG tablet    Sig: Take 1 tablet (500 mg total) by mouth 2 (two) times daily.    Dispense:  14 tablet    Refill:  0  . fluconazole (DIFLUCAN) 150 MG tablet    Sig: Take 1 tablet (150 mg total) by mouth once for 1 dose. Repeat second dose in 3 days    Dispense:  2 tablet    Refill:  0    Follow-up: No follow-ups on file.    Doristine Bosworth, MD

## 2018-07-25 NOTE — Patient Instructions (Signed)
° ° ° °  If you have lab work done today you will be contacted with your lab results within the next 2 weeks.  If you have not heard from us then please contact us. The fastest way to get your results is to register for My Chart. ° ° °IF you received an x-ray today, you will receive an invoice from [facility]. Please contact [facility] at [phone] with questions or concerns regarding your invoice.  ° °IF you received labwork today, you will receive an invoice from [facility]. Please contact [facility] at [phone] with questions or concerns regarding your invoice.  ° °Our billing staff will not be able to assist you with questions regarding bills from these companies. ° °You will be contacted with the lab results as soon as they are available. The fastest way to get your results is to activate your My Chart account. Instructions are located on the last page of this paperwork. If you have not heard from us regarding the results in 2 weeks, please contact this office. °  ° ° ° °

## 2018-08-06 ENCOUNTER — Ambulatory Visit: Payer: BC Managed Care – PPO | Admitting: Family Medicine

## 2018-08-17 ENCOUNTER — Telehealth: Payer: Self-pay | Admitting: Nurse Practitioner

## 2018-08-17 DIAGNOSIS — N3000 Acute cystitis without hematuria: Secondary | ICD-10-CM

## 2018-08-17 MED ORDER — CEPHALEXIN 500 MG PO CAPS: 500.0000 mg | ORAL_CAPSULE | Freq: Two times a day (BID) | ORAL | 0 refills | Status: DC

## 2018-08-17 MED ORDER — FLUCONAZOLE 150 MG PO TABS: 150.0000 mg | ORAL_TABLET | Freq: Once | ORAL | 0 refills | Status: AC

## 2018-08-17 NOTE — Progress Notes (Signed)

## 2018-08-17 NOTE — Addendum Note (Signed)
Addended by: Bennie Pierini on: 08/17/2018 11:39 AM   Modules accepted: Orders

## 2018-08-25 ENCOUNTER — Ambulatory Visit: Payer: BC Managed Care – PPO | Admitting: Family Medicine

## 2019-01-07 DIAGNOSIS — G4700 Insomnia, unspecified: Secondary | ICD-10-CM | POA: Insufficient documentation

## 2019-01-09 ENCOUNTER — Encounter: Payer: Self-pay | Admitting: Family Medicine

## 2019-01-09 ENCOUNTER — Other Ambulatory Visit: Payer: Self-pay

## 2019-01-09 ENCOUNTER — Ambulatory Visit: Payer: BC Managed Care – PPO | Admitting: Family Medicine

## 2019-01-09 VITALS — BP 147/84 | HR 94 | Temp 97.9°F | Resp 16 | Ht 60.0 in | Wt 154.6 lb

## 2019-01-09 DIAGNOSIS — R300 Dysuria: Secondary | ICD-10-CM | POA: Diagnosis not present

## 2019-01-09 DIAGNOSIS — F411 Generalized anxiety disorder: Secondary | ICD-10-CM

## 2019-01-09 DIAGNOSIS — N390 Urinary tract infection, site not specified: Secondary | ICD-10-CM

## 2019-01-09 DIAGNOSIS — Z13228 Encounter for screening for other metabolic disorders: Secondary | ICD-10-CM | POA: Diagnosis not present

## 2019-01-09 DIAGNOSIS — F430 Acute stress reaction: Secondary | ICD-10-CM

## 2019-01-09 LAB — POCT URINALYSIS DIP (MANUAL ENTRY)
Bilirubin, UA: NEGATIVE
Glucose, UA: NEGATIVE mg/dL
Ketones, POC UA: NEGATIVE mg/dL
Nitrite, UA: NEGATIVE
Protein Ur, POC: 30 mg/dL — AB
Spec Grav, UA: 1.015 (ref 1.010–1.025)
Urobilinogen, UA: 0.2 E.U./dL
pH, UA: 7 (ref 5.0–8.0)

## 2019-01-09 LAB — POC MICROSCOPIC URINALYSIS (UMFC)

## 2019-01-09 MED ORDER — NITROFURANTOIN MONOHYD MACRO 100 MG PO CAPS: 100.0000 mg | ORAL_CAPSULE | Freq: Two times a day (BID) | ORAL | 0 refills | Status: AC

## 2019-01-09 MED ORDER — CITALOPRAM HYDROBROMIDE 10 MG PO TABS: 10.0000 mg | ORAL_TABLET | Freq: Every day | ORAL | 3 refills | Status: DC

## 2019-01-09 NOTE — Progress Notes (Signed)
Established Patient Office Visit  Subjective:  Patient ID: Aimee Gray, female    DOB: 01-03-1971  Age: 48 y.o. MRN: [id number]  CC:  Chief Complaint  Patient presents with  . Urinary Tract Infection    per pt.    HPI Aimee Gray presents for   Acute UTI Pt reports that she has been having polyuria and urgency for a week without any back pain  No hematuria She notices cloudy urine She gets UTIs and can tell the symptoms are recurring Last episode was April 2020.   [facility] 01/09/2019 07/25/2018 06/21/2017 04/17/2017 03/15/2017  Decreased Interest 0 0 0 0 0  Down, Depressed, Hopeless 0 0 0 0 0  PHQ - 2 Score 0 0 0 0 0  Altered sleeping - - - - -  Tired, decreased energy - - - - -  Change in appetite - - - - -  Feeling bad or failure about yourself  - - - - -  Trouble concentrating - - - - -  Moving slowly or fidgety/restless - - - - -  Suicidal thoughts - - - - -  PHQ-9 Score - - - - -  Difficult doing work/chores - - - - -   GAD 7 : Generalized Anxiety Score 01/09/2019  Nervous, Anxious, on Edge 3  Control/stop worrying 3  Worry too much - different things 3  Trouble relaxing 3  Restless 3  Easily annoyed or irritable 3  Afraid - awful might happen 2  Total GAD 7 Score 20  Anxiety Difficulty Somewhat difficult    Patient reports that she is very irritable She is a picker and picks at 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 She did well with wellbutrin but had ear ringing  She goes hiking and feels well for short periods of time and has not been able to hold on to the good feelings.    Past Medical History:  Diagnosis Date  . Allergy   . Anxiety   . Depression     Past Surgical  History:  Procedure Laterality Date  . CESAREAN SECTION      Family History  Problem Relation Age of Onset  . Heart disease Mother   . Hypertension Mother   . Diabetes Father   . Heart disease Father   . Hyperlipidemia Father   . Hypertension Father   . Cancer Maternal Grandmother   . Diabetes Maternal Grandfather   . Stroke Maternal Grandfather   . Heart disease Paternal Grandmother   . Hypertension Paternal Grandmother   . Heart disease Paternal Grandfather     Social History   Socioeconomic History  . Marital status: Married    Spouse name: Not on file  . Number of children: Not on file  . Years of education: Not on file  . Highest education level: Not on file  Occupational History  . Not on file  Social Needs  . Financial resource strain: Not on file  . Food insecurity    Worry: Not on file    Inability: Not on file  . Transportation needs    Medical: Not on file    Non-medical: Not  on file  Tobacco Use  . Smoking status: Former Research scientist (life sciences)  . Smokeless tobacco: Never Used  . Tobacco comment: quit 5 years ago  Substance and Sexual Activity  . Alcohol use: No  . Drug use: No  . Sexual activity: Never  Lifestyle  . Physical activity    Days per week: Not on file    Minutes per session: Not on file  . Stress: Not on file  Relationships  . Social Herbalist on phone: Not on file    Gets together: Not on file    Attends religious service: Not on file    Active member of club or organization: Not on file    Attends meetings of clubs or organizations: Not on file    Relationship status: Not on file  . Intimate partner violence    Fear of current or ex partner: Not on file    Emotionally abused: Not on file    Physically abused: Not on file    Forced sexual activity: Not on file  Other Topics Concern  . Not on file  Social History Narrative  . Not on file    Outpatient Medications Prior to Visit  Medication Sig Dispense Refill  . rizatriptan  ([name]-MLT) 10 MG disintegrating tablet DISSOLVE 1 TABLET BY MOUTH AS NEEDED FOR MIGRAINE. MAY REPEAT IN 2 HOURS IF NEEDED. NEEDS OFFICE VISIT. 10 tablet 3  . cephALEXin (KEFLEX) 500 MG capsule Take 1 capsule (500 mg total) by mouth 2 (two) times daily. (Patient not taking: Reported on 01/09/2019) 14 capsule 0  . metroNIDAZOLE (FLAGYL) 500 MG tablet Take 1 tablet (500 mg total) by mouth 2 (two) times daily. (Patient not taking: Reported on 01/09/2019) 14 tablet 0  . ondansetron (ZOFRAN) 4 MG tablet Take 1 tablet (4 mg total) by mouth every 8 (eight) hours as needed for nausea or vomiting. (Patient not taking: Reported on 01/09/2019) 20 tablet 0   No facility-administered medications prior to visit.     Allergies  Allergen Reactions  . Hydrocodone Nausea And Vomiting    ROS Review of Systems Review of Systems  Constitutional: Negative for activity change, appetite change, chills and fever.  HENT: Negative for congestion, nosebleeds, trouble swallowing and voice change.   Respiratory: Negative for cough, shortness of breath and wheezing.   Gastrointestinal: Negative for diarrhea, nausea and vomiting.  Genitourinary: Negative for difficulty urinating, dysuria, flank pain and hematuria.  Musculoskeletal: Negative for back pain, joint swelling and neck pain.  Neurological: Negative for dizziness, speech difficulty, light-headedness and numbness.  See HPI. All other review of systems negative.     Objective:    Physical Exam  BP (!) 147/84 (BP Location: Right Arm, Patient Position: Sitting, Cuff Size: Normal)   Pulse 94   Temp 97.9 F (36.6 C) (Oral)   Resp 16   Ht 5' (1.524 m)   Wt 154 lb 9.6 oz (70.1 kg)   SpO2 98%   BMI 30.19 kg/m  Wt Readings from Last 3 Encounters:  01/09/19 154 lb 9.6 oz (70.1 kg)  07/25/18 146 lb (66.2 kg)  06/21/17 151 lb (68.5 kg)   Physical Exam  Constitutional: Oriented to person, place, and time. Appears well-developed and well-nourished.  HENT:   Head: Normocephalic and atraumatic.  Eyes: Conjunctivae and EOM are normal.  Cardiovascular: Normal rate, regular rhythm, normal heart sounds and intact distal pulses.  No murmur heard. Pulmonary/Chest: Effort normal and breath sounds normal. No stridor. No respiratory distress. Has  no wheezes.  Neurological: Is alert and oriented to person, place, and time.  Skin: Skin is warm. Capillary refill takes less than 2 seconds.  Psychiatric: Has a normal mood and affect. Behavior is normal. Judgment and thought content normal.    Health Maintenance Due  Topic Date Due  . HIV Screening  11/16/1987  . TETANUS/TDAP  11/16/1991  . PAP SMEAR-Modifier  11/15/1993  . INFLUENZA VACCINE  12/22/2018    There are no preventive care reminders to display for this patient.  Lab Results  Component Value Date   TSH 2.300 04/17/2017   Lab Results  Component Value Date   WBC 9.4 04/17/2017   HGB 14.1 04/17/2017   HCT 41.3 04/17/2017   MCV 90 04/17/2017   PLT 373 04/17/2017   Lab Results  Component Value Date   NA 138 04/17/2017   K 3.7 04/17/2017   CO2 22 04/17/2017   GLUCOSE 76 04/17/2017   BUN 9 04/17/2017   CREATININE 0.71 04/17/2017   BILITOT 0.5 04/17/2017   ALKPHOS 50 04/17/2017   AST 13 04/17/2017   ALT 13 04/17/2017   PROT 6.5 04/17/2017   ALBUMIN 4.7 04/17/2017   CALCIUM 9.4 04/17/2017   Lab Results  Component Value Date   CHOL 144 04/17/2017   Lab Results  Component Value Date   HDL 42 04/17/2017   Lab Results  Component Value Date   LDLCALC 90 04/17/2017   Lab Results  Component Value Date   TRIG 60 04/17/2017   Lab Results  Component Value Date   CHOLHDL 3.4 04/17/2017   No results found for: HGBA1C    Assessment & Plan:   Problem List Items Addressed This Visit      Other   Generalized anxiety disorder  -  Discussed that she should also continue exercise Continue discussing stress with colleagues [facility]  Will avoid  SSRIs with increased risk of decreased libido and drowsiness   Relevant Medications   citalopram (CELEXA) 10 MG tablet   Other Relevant Orders   Ambulatory referral to Behavioral Health    Other Visit Diagnoses    Dysuria    -  Primary   Relevant Orders   POCT urinalysis dipstick (Completed)   POCT Microscopic Urinalysis ([facility]) (Completed)   Urine Culture   Screening for metabolic disorder       Acute UTI    -  Advised pt to continue hydration Will use macrobid while awaiting urine culture   Relevant Medications   nitrofurantoin, macrocrystal-monohydrate, (MACROBID) 100 MG capsule   Other Relevant Orders   Urine Culture   Acute reaction to situational stress    -  Discussed celexa and titration Discussed that she should also do BH counseling   Relevant Medications   citalopram (CELEXA) 10 MG tablet      Meds ordered this encounter  Medications  . nitrofurantoin, macrocrystal-monohydrate, (MACROBID) 100 MG capsule    Sig: Take 1 capsule (100 mg total) by mouth 2 (two) times daily for 5 days.    Dispense:  10 capsule    Refill:  0  . citalopram (CELEXA) 10 MG tablet    Sig: Take 1 tablet (10 mg total) by mouth daily.    Dispense:  30 tablet    Refill:  3    Follow-up: No follow-ups on file.   A total of 30 minutes were spent face-to-face with the patient during this encounter and over half of that time was spent  on counseling and coordination of care.   Doristine BosworthZoe A Baer Hinton, MD

## 2019-01-09 NOTE — Patient Instructions (Addendum)
   If you have lab work done today you will be contacted with your lab results within the next 2 weeks.  If you have not heard from us then please contact us. The fastest way to get your results is to register for My Chart.   IF you received an x-ray today, you will receive an invoice from [facility]. Please contact [facility] at [phone] with questions or concerns regarding your invoice.   IF you received labwork today, you will receive an invoice from [facility]. Please contact [facility] at [phone] with questions or concerns regarding your invoice.   Our billing staff will not be able to assist you with questions regarding bills from these companies.  You will be contacted with the lab results as soon as they are available. The fastest way to get your results is to activate your My Chart account. Instructions are located on the last page of this paperwork. If you have not heard from us regarding the results in 2 weeks, please contact this office.     Living With Anxiety  After being diagnosed with an anxiety disorder, you may be relieved to know why you have felt or behaved a certain way. It is natural to also feel overwhelmed about the treatment ahead and what it will mean for your life. With care and support, you can manage this condition and recover from it. How to cope with anxiety Dealing with stress Stress is your body's reaction to life changes and events, both good and bad. Stress can last just a few hours or it can be ongoing. Stress can play a major role in anxiety, so it is important to learn both how to cope with stress and how to think about it differently. Talk with your health care provider or a counselor to learn more about stress reduction. He or she may suggest some stress reduction techniques, such as:  Music therapy. This can include creating or listening to music that you enjoy and that inspires you.  Mindfulness-based meditation. This  involves being aware of your normal breaths, rather than trying to control your breathing. It can be done while sitting or walking.  Centering prayer. This is a kind of meditation that involves focusing on a word, phrase, or sacred image that is meaningful to you and that brings you peace.  Deep breathing. To do this, expand your stomach and inhale slowly through your nose. Hold your breath for 3-5 seconds. Then exhale slowly, allowing your stomach muscles to relax.  Self-talk. This is a skill where you identify thought patterns that lead to anxiety reactions and correct those thoughts.  Muscle relaxation. This involves tensing muscles then relaxing them. Choose a stress reduction technique that fits your lifestyle and personality. Stress reduction techniques take time and practice. Set aside 5-15 minutes a day to do them. Therapists can offer training in these techniques. The training may be covered by some insurance plans. Other things you can do to manage stress include:  Keeping a stress diary. This can help you learn what triggers your stress and ways to control your response.  Thinking about how you respond to certain situations. You may not be able to control everything, but you can control your reaction.  Making time for activities that help you relax, and not feeling guilty about spending your time in this way. Therapy combined with coping and stress-reduction skills provides the best chance for successful treatment. Medicines Medicines can help ease symptoms. Medicines for anxiety include:    Anti-anxiety drugs.  Antidepressants.  Beta-blockers. Medicines may be used as the main treatment for anxiety disorder, along with therapy, or if other treatments are not working. Medicines should be prescribed by a health care provider. Relationships Relationships can play a big part in helping you recover. Try to spend more time connecting with trusted friends and family members. Consider  going to couples counseling, taking family education classes, or going to family therapy. Therapy can help you and others better understand the condition. How to recognize changes in your condition Everyone has a different response to treatment for anxiety. Recovery from anxiety happens when symptoms decrease and stop interfering with your daily activities at home or work. This may mean that you will start to:  Have better concentration and focus.  Sleep better.  Be less irritable.  Have more energy.  Have improved memory. It is important to recognize when your condition is getting worse. Contact your health care provider if your symptoms interfere with home or work and you do not feel like your condition is improving. Where to find help and support: You can get help and support from these sources:  Self-help groups.  Online and community organizations.  A trusted spiritual leader.  Couples counseling.  Family education classes.  Family therapy. Follow these instructions at home:  Eat a healthy diet that includes plenty of vegetables, fruits, whole grains, low-fat dairy products, and lean protein. Do not eat a lot of foods that are high in solid fats, added sugars, or salt.  Exercise. Most adults should do the following: ? Exercise for at least 150 minutes each week. The exercise should increase your heart rate and make you sweat (moderate-intensity exercise). ? Strengthening exercises at least twice a week.  Cut down on caffeine, tobacco, alcohol, and other potentially harmful substances.  Get the right amount and quality of sleep. Most adults need 7-9 hours of sleep each night.  Make choices that simplify your life.  Take over-the-counter and prescription medicines only as told by your health care provider.  Avoid caffeine, alcohol, and certain over-the-counter cold medicines. These may make you feel worse. Ask your pharmacist which medicines to avoid.  Keep all  follow-up visits as told by your health care provider. This is important. Questions to ask your health care provider  Would I benefit from therapy?  How often should I follow up with a health care provider?  How long do I need to take medicine?  Are there any long-term side effects of my medicine?  Are there any alternatives to taking medicine? Contact a health care provider if:  You have a hard time staying focused or finishing daily tasks.  You spend many hours a day feeling worried about everyday life.  You become exhausted by worry.  You start to have headaches, feel tense, or have nausea.  You urinate more than normal.  You have diarrhea. Get help right away if:  You have a racing heart and shortness of breath.  You have thoughts of hurting yourself or others. If you ever feel like you may hurt yourself or others, or have thoughts about taking your own life, get help right away. You can go to your nearest emergency department or call:  Your local emergency services (911 in the [place]  A suicide crisis helpline, such as the National Suicide Prevention Lifeline at 1-800-273-8255. This is open 24-hours a day. Summary  Taking steps to deal with stress can help calm you.  Medicines cannot cure anxiety disorders,   but they can help ease symptoms.  Family, friends, and partners can play a big part in helping you recover from an anxiety disorder. This information is not intended to replace advice given to you by your health care provider. Make sure you discuss any questions you have with your health care provider. Document Released: 05/03/2016 Document Revised: 04/21/2017 Document Reviewed: 05/03/2016 Elsevier Patient Education  2020 [employer] Inc[employer]  

## 2019-01-11 LAB — URINE CULTURE

## 2019-01-15 ENCOUNTER — Other Ambulatory Visit: Payer: Self-pay | Admitting: Family Medicine

## 2019-01-15 ENCOUNTER — Encounter: Payer: Self-pay | Admitting: Family Medicine

## 2019-01-15 DIAGNOSIS — G43101 Migraine with aura, not intractable, with status migrainosus: Secondary | ICD-10-CM

## 2019-01-15 NOTE — Telephone Encounter (Signed)
Requested medication (s) are due for refill today: yes  Requested medication (s) are on the active medication list: yes  Last refill: 07/25/2018  Future visit scheduled: no  Notes to clinic:  vm left for patient to schedule follow up on migraines   Requested Prescriptions  Pending Prescriptions Disp Refills   rizatriptan ([name]-MLT) 10 MG disintegrating tablet [Pharmacy Med Name: RIZATRIPTAN 10 MG ODT] 10 tablet 3    Sig: DISSOLVE 1 TAB BY MOUTH AS NEEDED FOR MIGRAINE. MAY REPEAT IN 2 HOURS IF NEEDED. NEEDS OFFICE VISIT     Neurology:  Migraine Therapy - Triptan Failed - 01/15/2019  1:45 AM      Failed - Last BP in normal range    BP Readings from Last 1 Encounters:  01/09/19 (!) 147/84         Passed - Valid encounter within last 12 months    Recent Outpatient Visits          6 days ago Dysuria   Primary Care at [facility], Zoe A, MD   5 months ago Migraine with aura and with status migrainosus, not intractable   Primary Care at [facility], Arlie Solomons, MD   1 year ago Acute non-recurrent pansinusitis   Primary Care at [place], [place], [place]   1 year ago Annual physical exam   Primary Care at [place], [place], [place]   1 year ago Streptococcal sore throat   Primary Care at [place], [place], [place]

## 2019-01-16 ENCOUNTER — Other Ambulatory Visit: Payer: Self-pay

## 2019-01-16 ENCOUNTER — Telehealth (INDEPENDENT_AMBULATORY_CARE_PROVIDER_SITE_OTHER): Payer: BC Managed Care – PPO | Admitting: Registered Nurse

## 2019-01-16 ENCOUNTER — Encounter: Payer: Self-pay | Admitting: Registered Nurse

## 2019-01-16 VITALS — Ht 60.0 in | Wt 154.0 lb

## 2019-01-16 DIAGNOSIS — G43101 Migraine with aura, not intractable, with status migrainosus: Secondary | ICD-10-CM

## 2019-01-16 MED ORDER — PROPRANOLOL HCL 20 MG PO TABS: 20.0000 mg | ORAL_TABLET | Freq: Two times a day (BID) | ORAL | 0 refills | Status: DC

## 2019-01-16 MED ORDER — TOPIRAMATE 25 MG PO TABS: 25.0000 mg | ORAL_TABLET | Freq: Every day | ORAL | 0 refills | Status: DC

## 2019-01-16 NOTE — Progress Notes (Signed)
Celexa is helping for last wed PHQ9 = 21  Bad Migraines and yesterday was one of the worse migraines ever had and [name] didn't help and usually it does.    Worried about an interaction between celexa and Recruitment consultant

## 2019-01-16 NOTE — Progress Notes (Signed)
Telemedicine Encounter- SOAP NOTE Established Patient  This telephone encounter was conducted with the patient's (or proxy's) verbal consent via audio telecommunications: yes   Patient was instructed to have this encounter in a suitably private space; and to only have persons present to whom they give permission to participate. In addition, patient identity was confirmed by use of name plus two identifiers (DOB and address).  I discussed the limitations, risks, security and privacy concerns of performing an evaluation and management service by telephone and the availability of in person appointments. I also discussed with the patient that there may be a patient responsible charge related to this service. The patient expressed understanding and agreed to proceed.  I spent a total of 16 minutes talking with the patient or their proxy.  No chief complaint on file.   Subjective   Aimee Gray is a 48 y.o. established patient. Telephone visit today for migraines  HPI Aimee Gray was seen by her PCP Dr. Nolon Rod last Wednesday to start on Celexa for depression and anxiety - this is common for her around this time of year, as she's a Radio producer. This has already shown positive effect, however, she has a history of migraines and has taken [name] in the past with good effect. At the onset of migraine yesterday, she took a Recruitment consultant and unfortunately experienced vomiting for the full day, an abnormality for her. No known sick contacts. No known exposure to GI bug or COVID. States that when she has had GI bug in past, she will puke once or twice and then be fine to sleep - not this time. Unfortunately, there is some suspicion for serotonin syndrome with this clinical presentation, however, this cannot be confirmed, as she is feeling better and unfortunately does not have other vitals from her symptoms yesterday.  She states she gets migraines every few weeks - sometimes only once a month. She is  optimistic that a preventative therapy may provide some relief while also having less chance of interaction with her Celexa.  Patient Active Problem List   Diagnosis Date Noted  . Acute bronchitis 08/10/2016  . Migraine with aura and with status migrainosus, not intractable 08/10/2016  . Depression 10/14/2014  . Generalized anxiety disorder 10/14/2014    Past Medical History:  Diagnosis Date  . Allergy   . Anxiety   . Depression     Current Outpatient Medications  Medication Sig Dispense Refill  . citalopram (CELEXA) 10 MG tablet Take 1 tablet (10 mg total) by mouth daily. 30 tablet 3  . rizatriptan ([name]-MLT) 10 MG disintegrating tablet DISSOLVE 1 TAB BY MOUTH AS NEEDED FOR MIGRAINE. MAY REPEAT IN 2 HOURS IF NEEDED. NEEDS OFFICE VISIT 10 tablet 0  . propranolol (INDERAL) 20 MG tablet Take 1 tablet (20 mg total) by mouth 2 (two) times daily. 90 tablet 0  . topiramate (TOPAMAX) 25 MG tablet Take 1 tablet (25 mg total) by mouth daily. 45 tablet 0   No current facility-administered medications for this visit.     Allergies  Allergen Reactions  . Hydrocodone Nausea And Vomiting    Social History   Socioeconomic History  . Marital status: Married    Spouse name: Not on file  . Number of children: Not on file  . Years of education: Not on file  . Highest education level: Not on file  Occupational History  . Not on file  Social Needs  . Financial resource strain: Not on file  . Food insecurity  Worry: Not on file    Inability: Not on file  . Transportation needs    Medical: Not on file    Non-medical: Not on file  Tobacco Use  . Smoking status: Former Games developermoker  . Smokeless tobacco: Never Used  . Tobacco comment: quit 5 years ago  Substance and Sexual Activity  . Alcohol use: No  . Drug use: No  . Sexual activity: Never  Lifestyle  . Physical activity    Days per week: Not on file    Minutes per session: Not on file  . Stress: Not on file  Relationships  .  Social Musicianconnections    Talks on phone: Not on file    Gets together: Not on file    Attends religious service: Not on file    Active member of club or organization: Not on file    Attends meetings of clubs or organizations: Not on file    Relationship status: Not on file  . Intimate partner violence    Fear of current or ex partner: Not on file    Emotionally abused: Not on file    Physically abused: Not on file    Forced sexual activity: Not on file  Other Topics Concern  . Not on file  Social History Narrative  . Not on file    Review of Systems  Constitutional: Negative.   HENT: Negative.   Eyes: Negative.   Respiratory: Negative.   Cardiovascular: Negative.   Gastrointestinal: Negative.   Genitourinary: Negative.   Musculoskeletal: Negative.   Skin: Negative.   Neurological: Positive for headaches.  Endo/Heme/Allergies: Negative.   Psychiatric/Behavioral: Negative.   All other systems reviewed and are negative.   Objective   Vitals as reported by the patient: Today's Vitals   01/16/19 1533  Weight: 154 lb (69.9 kg)  Height: 5' (1.524 m)    Diagnoses and all orders for this visit:  Migraine with aura and with status migrainosus, not intractable -     propranolol (INDERAL) 20 MG tablet; Take 1 tablet (20 mg total) by mouth 2 (two) times daily. -     topiramate (TOPAMAX) 25 MG tablet; Take 1 tablet (25 mg total) by mouth daily.   PLAN  Will start Propranolol 20mg  PO bid and Topamax 25 mg PO qpm for migraine prevention. There is still a chance of interaction between topamax and celexa, but she will take them 12 hours apart and we discussed the risks and benefits of this course vs. Maintaining abortive therapy.  She will still take NSAIDs / tylenol as adjunctive therapy  Will follow up in 2-4 weeks for med check  Patient encouraged to call clinic with any questions, comments, or concerns.    I discussed the assessment and treatment plan with the patient.  The patient was provided an opportunity to ask questions and all were answered. The patient agreed with the plan and demonstrated an understanding of the instructions.   The patient was advised to call back or seek an in-person evaluation if the symptoms worsen or if the condition fails to improve as anticipated.  I provided 16 minutes of non-face-to-face time during this encounter.  Janeece Ageeichard Myrtle Barnhard, NP  Primary Care at [facility]

## 2019-01-31 ENCOUNTER — Other Ambulatory Visit: Payer: Self-pay | Admitting: Family Medicine

## 2019-01-31 DIAGNOSIS — F411 Generalized anxiety disorder: Secondary | ICD-10-CM

## 2019-01-31 NOTE — Telephone Encounter (Signed)
Requested medication (s) are due for refill today: yes  Requested medication (s) are on the active medication list: yes  Last refill: 01/09/2019   Future visit scheduled: yes  Notes to clinicREQUEST FOR 90 DAYS PRESCRIPTION:    Requested Prescriptions  Pending Prescriptions Disp Refills   citalopram (CELEXA) 10 MG tablet [Pharmacy Med Name: CITALOPRAM HBR 10 MG TABLET] 90 tablet 2    Sig: TAKE 1 [place]     Psychiatry:  Antidepressants - SSRI Passed - 01/31/2019  9:34 AM      Passed - Valid encounter within last 6 months    Recent Outpatient Visits          2 weeks ago Migraine with aura and with status migrainosus, not intractable   [facility] at [facility], Delfino Lovett, NP   3 weeks ago Dysuria   Primary Care at [facility], Zoe A, MD   6 months ago Migraine with aura and with status migrainosus, not intractable   Primary Care at [facility], Arlie Solomons, MD   1 year ago Acute non-recurrent pansinusitis   Primary Care at [place], [place], [place]   1 year ago Annual physical exam   Primary Care at [place], [place], [place]      Future Appointments            In 1 week Maximiano Coss, NP Primary Care at [place], [place] - Completed PHQ-2 or PHQ-9 in the last 360 days.

## 2019-02-02 ENCOUNTER — Encounter: Payer: Self-pay | Admitting: Family Medicine

## 2019-02-11 ENCOUNTER — Telehealth (INDEPENDENT_AMBULATORY_CARE_PROVIDER_SITE_OTHER): Payer: BC Managed Care – PPO | Admitting: Registered Nurse

## 2019-02-11 DIAGNOSIS — G43101 Migraine with aura, not intractable, with status migrainosus: Secondary | ICD-10-CM | POA: Diagnosis not present

## 2019-02-11 DIAGNOSIS — F411 Generalized anxiety disorder: Secondary | ICD-10-CM | POA: Diagnosis not present

## 2019-02-11 MED ORDER — ALPRAZOLAM 0.25 MG PO TBDP: 0.2500 mg | ORAL_TABLET | Freq: Every evening | ORAL | 0 refills | Status: DC | PRN

## 2019-02-11 NOTE — Progress Notes (Signed)
Spoke with pt and she states she needs to follow-up about her migraines. She states the medication Topamax is helping some but it makes her feel really drowsy. She states she has had 2 migrains since starting the medication so that is an improvement. She also states she has been having some insomnia issues for a while now and would like to talk about something to help.

## 2019-02-11 NOTE — Progress Notes (Signed)
Telemedicine Encounter- SOAP NOTE Established Patient  This telephone encounter was conducted with the patient's (or proxy's) verbal consent via audio telecommunications: yes  Patient was instructed to have this encounter in a suitably private space; and to only have persons present to whom they give permission to participate. In addition, patient identity was confirmed by use of name plus two identifiers (DOB and address).  I discussed the limitations, risks, security and privacy concerns of performing an evaluation and management service by telephone and the availability of in person appointments. I also discussed with the patient that there may be a patient responsible charge related to this service. The patient expressed understanding and agreed to proceed.  I spent a total of 24 minutes talking with the patient or their proxy.  Chief Complaint  Patient presents with  . Migraine    Subjective   Aimee Gray is a 48 y.o. established patient. Telephone visit today for migraine follow up.  HPI We started preventative therapy with topiramate and propranolol at her last visit with me. This has had good effect. She has only had two headaches since, one being very minor and the other resolving within the day. She also increased her citalopram to 20mg  She reports poor sleep: trouble falling asleep, trouble staying asleep. She states that her anxiety keeps her up. She reports overall good sleep hygiene. She has tried OTCs like tylenol PM and benadryl with limited effect.   Patient Active Problem List   Diagnosis Date Noted  . Acute bronchitis 08/10/2016  . Migraine with aura and with status migrainosus, not intractable 08/10/2016  . Depression 10/14/2014  . Generalized anxiety disorder 10/14/2014    Past Medical History:  Diagnosis Date  . Allergy   . Anxiety   . Depression     Current Outpatient Medications  Medication Sig Dispense Refill  . citalopram (CELEXA) 10 MG  tablet Take 1 tablet (10 mg total) by mouth daily. 30 tablet 3  . propranolol (INDERAL) 20 MG tablet Take 1 tablet (20 mg total) by mouth 2 (two) times daily. 90 tablet 0  . rizatriptan ([name]-MLT) 10 MG disintegrating tablet DISSOLVE 1 TAB BY MOUTH AS NEEDED FOR MIGRAINE. MAY REPEAT IN 2 HOURS IF NEEDED. NEEDS OFFICE VISIT 10 tablet 0  . topiramate (TOPAMAX) 25 MG tablet Take 1 tablet (25 mg total) by mouth daily. 45 tablet 0  . ALPRAZolam (NIRAVAM) 0.25 MG dissolvable tablet Take 1-2 tablets (0.25-0.5 mg total) by mouth at bedtime as needed for anxiety. 60 tablet 0   No current facility-administered medications for this visit.     Allergies  Allergen Reactions  . Hydrocodone Nausea And Vomiting    Social History   Socioeconomic History  . Marital status: Married    Spouse name: Not on file  . Number of children: Not on file  . Years of education: Not on file  . Highest education level: Not on file  Occupational History  . Not on file  Social Needs  . Financial resource strain: Not on file  . Food insecurity    Worry: Not on file    Inability: Not on file  . Transportation needs    Medical: Not on file    Non-medical: Not on file  Tobacco Use  . Smoking status: Former Games developermoker  . Smokeless tobacco: Never Used  . Tobacco comment: quit 5 years ago  Substance and Sexual Activity  . Alcohol use: No  . Drug use: No  . Sexual activity:  Never  Lifestyle  . Physical activity    Days per week: Not on file    Minutes per session: Not on file  . Stress: Not on file  Relationships  . Social Herbalist on phone: Not on file    Gets together: Not on file    Attends religious service: Not on file    Active member of club or organization: Not on file    Attends meetings of clubs or organizations: Not on file    Relationship status: Not on file  . Intimate partner violence    Fear of current or ex partner: Not on file    Emotionally abused: Not on file    Physically  abused: Not on file    Forced sexual activity: Not on file  Other Topics Concern  . Not on file  Social History Narrative  . Not on file    ROS Per hpi   Objective   Vitals as reported by the patient: There were no vitals filed for this visit.  Aimee Gray was seen today for migraine.  Diagnoses and all orders for this visit:  Generalized anxiety disorder -     ALPRAZolam (NIRAVAM) 0.25 MG dissolvable tablet; Take 1-2 tablets (0.25-0.5 mg total) by mouth at bedtime as needed for anxiety.   PLAN  Alprazolam 0.25mg  sublingual tabs 1-2 times per night as needed for anxiety  This medication was chosen in order to keep her on her current anxiety and headache regimen. Our biggest concern would be an increased CNS depressant effect, but at a small dose, is of minimal concern. We discussed the benefits and risks of this medication in detail.  Follow up as needed  Patient encouraged to call clinic with any questions, comments, or concerns.    I discussed the assessment and treatment plan with the patient. The patient was provided an opportunity to ask questions and all were answered. The patient agreed with the plan and demonstrated an understanding of the instructions.   The patient was advised to call back or seek an in-person evaluation if the symptoms worsen or if the condition fails to improve as anticipated.  I provided 25 minutes of non-face-to-face time during this encounter.  Maximiano Coss, NP  Primary Care at [facility]

## 2019-02-23 ENCOUNTER — Other Ambulatory Visit: Payer: Self-pay | Admitting: Registered Nurse

## 2019-02-23 ENCOUNTER — Telehealth: Payer: Self-pay

## 2019-02-23 ENCOUNTER — Other Ambulatory Visit: Payer: Self-pay

## 2019-02-23 DIAGNOSIS — G43101 Migraine with aura, not intractable, with status migrainosus: Secondary | ICD-10-CM

## 2019-02-23 NOTE — Telephone Encounter (Signed)
Requested Prescriptions  Pending Prescriptions Disp Refills  . propranolol (INDERAL) 20 MG tablet [Pharmacy Med Name: PROPRANOLOL 20 MG TABLET] 180 tablet 0    Sig: TAKE 1 TABLET BY MOUTH TWICE A DAY     Cardiovascular:  Beta Blockers Failed - 02/23/2019  2:32 PM      Failed - Last BP in normal range    BP Readings from Last 1 Encounters:  01/09/19 (!) 147/84         Passed - Last Heart Rate in normal range    Pulse Readings from Last 1 Encounters:  01/09/19 94         Passed - Valid encounter within last 6 months    Recent Outpatient Visits          1 week ago Generalized anxiety disorder   [facility], Delfino Lovett, NP   1 month ago Migraine with aura and with status migrainosus, not intractable   [facility], Delfino Lovett, NP   1 month ago Dysuria   Primary Care at [facility], [place], MD   7 months ago Migraine with aura and with status migrainosus, not intractable   Primary Care at [facility], Arlie Solomons, MD   1 year ago Acute non-recurrent pansinusitis   Primary Care at [place], [place], [place]             . topiramate (TOPAMAX) 25 MG tablet [Pharmacy Med Name: TOPIRAMATE 25 MG TABLET] 45 tablet 0    Sig: TAKE 1 TABLET BY MOUTH EVERY DAY     Not Delegated - Neurology: Anticonvulsants - topiramate & zonisamide Failed - 02/23/2019  2:32 PM      Failed - This refill cannot be delegated      Failed - Cr in normal range and within 360 days    Creatinine, Ser  Date Value Ref Range Status  04/17/2017 0.71 0.57 - 1.00 mg/dL Final         Failed - CO2 in normal range and within 360 days    CO2  Date Value Ref Range Status  04/17/2017 22 20 - 29 mmol/L Final         Passed - Valid encounter within last 12 months    Recent Outpatient Visits          1 week ago Generalized anxiety disorder   [facility], [place], NP   1 month ago Migraine with aura and with status migrainosus, not intractable   [facility] Care  at [facility], Delfino Lovett, NP   1 month ago Dysuria   Primary Care at [facility], Zoe A, MD   7 months ago Migraine with aura and with status migrainosus, not intractable   Primary Care at [facility], Arlie Solomons, MD   1 year ago Acute non-recurrent pansinusitis   Primary Care at [place], [place], [place]

## 2019-02-23 NOTE — Telephone Encounter (Signed)
Requested medication (s) are due for refill today:  Yes   Requested medication (s) are on the active medication list:  yes  Future visit scheduled:  no  Last Refill: Topiramate 01/16/19; #45; no refills  Note to clinic:  Topiramate was approved by mistake; this medication is not delegated; [facility] was notified by phone to cancel the refill authorization, until approved by provider.  It shows on Medication record as being refilled on 10/3, but this has been canceled.  Please review with provider and advise.  (Propranolol was refilled as is noted)   Requested Prescriptions  Signed Prescriptions Disp Refills   propranolol (INDERAL) 20 MG tablet 180 tablet 0    Sig: TAKE 1 TABLET BY MOUTH TWICE A DAY     Cardiovascular:  Beta Blockers Failed - 02/23/2019  2:32 PM      Failed - Last BP in normal range    BP Readings from Last 1 Encounters:  01/09/19 (!) 147/84         Passed - Last Heart Rate in normal range    Pulse Readings from Last 1 Encounters:  01/09/19 94         Passed - Valid encounter within last 6 months    Recent Outpatient Visits          1 week ago Generalized anxiety disorder   [facility], Delfino Lovett, NP   1 month ago Migraine with aura and with status migrainosus, not intractable   [facility], Delfino Lovett, NP   1 month ago Dysuria   Primary Care at [facility], [place], MD   7 months ago Migraine with aura and with status migrainosus, not intractable   Primary Care at [place], MD   1 year ago Acute non-recurrent pansinusitis   Primary Care at [place], [place], [place]              topiramate (TOPAMAX) 25 MG tablet 45 tablet 0    Sig: TAKE 1 TABLET BY MOUTH EVERY DAY     Not Delegated - Neurology: Anticonvulsants - topiramate & zonisamide Failed - 02/23/2019  2:32 PM      Failed - This refill cannot be delegated      Failed - Cr in normal range and within 360 days    Creatinine, Ser  Date Value  Ref Range Status  04/17/2017 0.71 0.57 - 1.00 mg/dL Final         Failed - CO2 in normal range and within 360 days    CO2  Date Value Ref Range Status  04/17/2017 22 20 - 29 mmol/L Final         Passed - Valid encounter within last 12 months    Recent Outpatient Visits          1 week ago Generalized anxiety disorder   [facility], [place], NP   1 month ago Migraine with aura and with status migrainosus, not intractable   [facility], Delfino Lovett, NP   1 month ago Dysuria   Primary Care at [facility], Zoe A, MD   7 months ago Migraine with aura and with status migrainosus, not intractable   Primary Care at [place], MD   1 year ago Acute non-recurrent pansinusitis   Primary Care at [place], [place] D, [place]

## 2019-02-23 NOTE — Telephone Encounter (Signed)
Topiramate 25 mg tablet was sent for refill from pharmacy.  This medication is not delegated, and was sent to [place] in error.  [facility]; spoke with Marden Noble to advise to cancel this refill request, until reviewed by pt's PCP.  Sent Topiramate Rx to office for review and recommendation on refill authorization.

## 2019-02-25 ENCOUNTER — Other Ambulatory Visit: Payer: Self-pay | Admitting: Family Medicine

## 2019-02-25 DIAGNOSIS — F411 Generalized anxiety disorder: Secondary | ICD-10-CM

## 2019-02-25 NOTE — Telephone Encounter (Signed)
Medication Refill - Medication: citalopram (CELEXA) 10 MG tablet  Has the patient contacted their pharmacy? No - pt was told by PCP to take 20mg  but the script is still written for 10mg  so the pharmacy can not refill at this time. (Agent: If no, request that the patient contact the pharmacy for the refill.) (Agent: If yes, when and what did the pharmacy advise?)  Preferred Pharmacy (with phone number or street name):  [id number] [place], [place] [phone] (Phone) [phone] (Fax)   Agent: Please be advised that RX refills may take up to 3 business days. We ask that you follow-up with your pharmacy.

## 2019-02-25 NOTE — Telephone Encounter (Signed)
Requested medication (s) are due for refill today: yes  Requested medication (s) are on the active medication list: yes  Last refill:  01/09/2019  Future visit scheduled: no  Notes to clinic: patient states that she is suppose to be 20mg  instead 10mg . Please advise    Requested Prescriptions  Pending Prescriptions Disp Refills   citalopram (CELEXA) 10 MG tablet 30 tablet 3    Sig: Take 1 tablet (10 mg total) by mouth daily.     Psychiatry:  Antidepressants - SSRI Passed - 02/25/2019  3:06 PM      Passed - Valid encounter within last 6 months    Recent Outpatient Visits          2 weeks ago Generalized anxiety disorder   [facility] at [facility], [place], NP   1 month ago Migraine with aura and with status migrainosus, not intractable   [facility], Delfino Lovett, NP   1 month ago Dysuria   Primary Care at [facility], [place] A, MD   7 months ago Migraine with aura and with status migrainosus, not intractable   Primary Care at [facility], Arlie Solomons, MD   1 year ago Acute non-recurrent pansinusitis   Primary Care at [place], [place] D, [place]             Passed - Completed PHQ-2 or PHQ-9 in the last 360 days.

## 2019-02-27 ENCOUNTER — Other Ambulatory Visit: Payer: Self-pay | Admitting: Registered Nurse

## 2019-02-27 DIAGNOSIS — G43101 Migraine with aura, not intractable, with status migrainosus: Secondary | ICD-10-CM

## 2019-03-01 MED ORDER — TOPIRAMATE 25 MG PO TABS: 25.0000 mg | ORAL_TABLET | Freq: Every day | ORAL | 1 refills | Status: DC

## 2019-03-05 ENCOUNTER — Other Ambulatory Visit: Payer: Self-pay | Admitting: Family Medicine

## 2019-03-05 DIAGNOSIS — G43101 Migraine with aura, not intractable, with status migrainosus: Secondary | ICD-10-CM

## 2019-03-06 ENCOUNTER — Ambulatory Visit (INDEPENDENT_AMBULATORY_CARE_PROVIDER_SITE_OTHER): Payer: BC Managed Care – PPO | Admitting: Psychology

## 2019-03-06 ENCOUNTER — Other Ambulatory Visit: Payer: Self-pay | Admitting: Family Medicine

## 2019-03-06 DIAGNOSIS — F411 Generalized anxiety disorder: Secondary | ICD-10-CM | POA: Diagnosis not present

## 2019-03-06 DIAGNOSIS — G43101 Migraine with aura, not intractable, with status migrainosus: Secondary | ICD-10-CM

## 2019-03-06 MED ORDER — CITALOPRAM HYDROBROMIDE 20 MG PO TABS: 20.0000 mg | ORAL_TABLET | Freq: Every day | ORAL | 3 refills | Status: DC

## 2019-03-06 MED ORDER — RIZATRIPTAN BENZOATE 10 MG PO TBDP: ORAL_TABLET | ORAL | 6 refills | Status: DC

## 2019-03-06 NOTE — Telephone Encounter (Signed)
Called pt today and informed her that her Rx has been sent to her pharmacy.

## 2019-03-18 ENCOUNTER — Ambulatory Visit (INDEPENDENT_AMBULATORY_CARE_PROVIDER_SITE_OTHER): Payer: BC Managed Care – PPO | Admitting: Psychology

## 2019-03-18 DIAGNOSIS — F411 Generalized anxiety disorder: Secondary | ICD-10-CM

## 2019-04-01 ENCOUNTER — Ambulatory Visit: Payer: BC Managed Care – PPO | Admitting: Psychology

## 2019-04-15 ENCOUNTER — Ambulatory Visit (INDEPENDENT_AMBULATORY_CARE_PROVIDER_SITE_OTHER): Payer: BC Managed Care – PPO | Admitting: Psychology

## 2019-04-15 DIAGNOSIS — F411 Generalized anxiety disorder: Secondary | ICD-10-CM | POA: Diagnosis not present

## 2019-04-29 ENCOUNTER — Ambulatory Visit: Payer: BC Managed Care – PPO | Admitting: Psychology

## 2019-05-01 ENCOUNTER — Ambulatory Visit (INDEPENDENT_AMBULATORY_CARE_PROVIDER_SITE_OTHER): Payer: BC Managed Care – PPO | Admitting: Psychology

## 2019-05-01 DIAGNOSIS — F411 Generalized anxiety disorder: Secondary | ICD-10-CM

## 2019-05-06 ENCOUNTER — Telehealth (INDEPENDENT_AMBULATORY_CARE_PROVIDER_SITE_OTHER): Payer: BC Managed Care – PPO | Admitting: Registered Nurse

## 2019-05-06 ENCOUNTER — Encounter: Payer: Self-pay | Admitting: Registered Nurse

## 2019-05-06 ENCOUNTER — Other Ambulatory Visit: Payer: Self-pay

## 2019-05-06 VITALS — Temp 102.9°F | Ht 60.0 in | Wt 153.0 lb

## 2019-05-06 DIAGNOSIS — N3090 Cystitis, unspecified without hematuria: Secondary | ICD-10-CM

## 2019-05-06 MED ORDER — PHENAZOPYRIDINE HCL 95 MG PO TABS: 95.0000 mg | ORAL_TABLET | Freq: Three times a day (TID) | ORAL | 0 refills | Status: DC | PRN

## 2019-05-06 MED ORDER — NITROFURANTOIN MONOHYD MACRO 100 MG PO CAPS: 100.0000 mg | ORAL_CAPSULE | Freq: Two times a day (BID) | ORAL | 0 refills | Status: AC

## 2019-05-06 NOTE — Progress Notes (Signed)
Telemedicine Encounter- SOAP NOTE Established Patient  This telephone encounter was conducted with the patient's (or proxy's) verbal consent via audio telecommunications: yes  Patient was instructed to have this encounter in a suitably private space; and to only have persons present to whom they give permission to participate. In addition, patient identity was confirmed by use of name plus two identifiers (DOB and address).  I discussed the limitations, risks, security and privacy concerns of performing an evaluation and management service by telephone and the availability of in person appointments. I also discussed with the patient that there may be a patient responsible charge related to this service. The patient expressed understanding and agreed to proceed.  I spent a total of 14 minutes talking with the patient or their proxy.  Chief Complaint  Patient presents with  . Abdominal Pain    pt stated---lower abdominal pain, hands are shaking, body ache, chills, headache,fever 102.9, nausea---since last friday    Subjective   Aimee Gray is a 48 y.o. established patient. Telephone visit today for abdominal pain  HPI Pt states symptoms started on Friday with suprapubic pain. Since, this has spread to LRQ/R flank pain. Additionally, she has noticed a fever, chills, and some nausea. This has remained somewhat stable Notes foul smell to urine. 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 Acute bronchitis 08/10/2016  . Migraine with aura and with status migrainosus, not intractable 08/10/2016  . Depression 10/14/2014  . Generalized anxiety disorder 10/14/2014    Past Medical History:  Diagnosis Date  . Allergy   . Anxiety   . Depression     Current Outpatient Medications  Medication Sig Dispense Refill  . ALPRAZolam (NIRAVAM) 0.25 MG dissolvable tablet Take 1-2 tablets  (0.25-0.5 mg total) by mouth at bedtime as needed for anxiety. 60 tablet 0  . citalopram (CELEXA) 20 MG tablet Take 1 tablet (20 mg total) by mouth daily. 90 tablet 3  . propranolol (INDERAL) 20 MG tablet TAKE 1 TABLET BY MOUTH TWICE A DAY 180 tablet 0  . rizatriptan ([name]-MLT) 10 MG disintegrating tablet DISSOLVE 1 TAB BY MOUTH AS NEEDED FOR MIGRAINE. MAY REPEAT IN 2 HOURS IF NEEDED. NEEDS OFFICE VISIT 10 tablet 6  . topiramate (TOPAMAX) 25 MG tablet Take 1 tablet (25 mg total) by mouth daily. 45 tablet 1  . nitrofurantoin, macrocrystal-monohydrate, (MACROBID) 100 MG capsule Take 1 capsule (100 mg total) by mouth 2 (two) times daily for 5 days. 10 capsule 0  . phenazopyridine (PYRIDIUM) 95 MG tablet Take 1 tablet (95 mg total) by mouth 3 (three) times daily as needed for pain. 10 tablet 0   No current facility-administered medications for this visit.    Allergies  Allergen Reactions  . Hydrocodone Nausea And Vomiting    Social History   Socioeconomic History  . Marital status: Married    Spouse name: Not on file  . Number of children: Not on file  . Years of education: Not on file  . Highest education level: Not on file  Occupational History  . Not on file  Tobacco Use  . Smoking status: Former Research scientist (life sciences)  . Smokeless tobacco: Never Used  . Tobacco comment: quit 5 years ago  Substance and Sexual Activity  . Alcohol use: No  . Drug use: No  . Sexual activity: Never  Other Topics Concern  . Not on file  Social History Narrative  . Not on file   Social Determinants of Health   Financial Resource Strain:   . Difficulty of Paying Living Expenses: Not on file  Food Insecurity:   . Worried About Programme researcher, broadcasting/film/video in the Last Year: Not on file  . Ran Out of Food in the Last Year: Not on file  Transportation Needs:   . Lack of Transportation (Medical): Not on file  . Lack of Transportation (Non-Medical): Not on file  Physical Activity:   . Days of Exercise per Week: Not on  file  . Minutes of Exercise per Session: Not on file  Stress:   . Feeling of Stress : Not on file  Social Connections:   . Frequency of Communication with Friends and Family: Not on file  . Frequency of Social Gatherings with Friends and Family: Not on file  . Attends Religious Services: Not on file  . Active Member of Clubs or Organizations: Not on file  . Attends Banker Meetings: Not on file  . Marital Status: Not on file  Intimate Partner Violence:   . Fear of Current or Ex-Partner: Not on file  . Emotionally Abused: Not on file  . Physically Abused: Not on file  . Sexually Abused: Not on file    Review of Systems  Constitutional: Positive for chills and fever.  HENT: Negative.   Eyes: Negative.   Respiratory: Negative.   Cardiovascular: Negative.   Gastrointestinal: Negative.   Genitourinary: Positive for dysuria, flank pain and urgency.  Musculoskeletal: Positive for myalgias (suprapubc pain).  Skin: Negative.   Neurological: Negative.   Endo/Heme/Allergies: Negative.   Psychiatric/Behavioral: Negative.     Objective   Vitals as reported by the patient: Today's Vitals   05/06/19 1010  Temp: (!) 102.9 F (39.4 C)  Weight: 153 lb (69.4 kg)  Height: 5' (1.524 m)    [name] was seen today for abdominal pain.  Diagnoses and all orders for this visit:  Cystitis -     nitrofurantoin, macrocrystal-monohydrate, (MACROBID) 100 MG capsule; Take 1 capsule (100 mg total) by mouth 2 (two) times daily for 5 days. -     phenazopyridine (PYRIDIUM) 95 MG tablet; Take 1 tablet (95 mg total) by mouth 3 (three) times daily as needed for pain.   PLAN  Discussed with patient that this sounds more like UTI than COVID given nature of pain and symptoms  Will treat with macrobid and azo  Return to clinic if symptoms worsen or fail to improve  Patient encouraged to call clinic with any questions, comments, or concerns.   I discussed the assessment and treatment  plan with the patient. The patient was provided an opportunity to ask questions and all were answered. The patient agreed with the plan and demonstrated an understanding of the instructions.   The patient was advised to call back or seek an in-person evaluation if the symptoms worsen or if the condition fails to improve as anticipated.  I provided 14 minutes of non-face-to-face time during this encounter.  Janeece Agee, NP  Primary Care at [facility]

## 2019-05-08 ENCOUNTER — Telehealth: Payer: Self-pay | Admitting: Registered Nurse

## 2019-05-08 NOTE — Telephone Encounter (Signed)
Pt called back to report fever, sweat and chills. She is having pain on both sides of her body and was advised to call back on Friday if her symptoms did not approve after the telemed appt and prescription. Pt waiting to hear from provider/assistant

## 2019-05-10 NOTE — Telephone Encounter (Signed)
Rich does the pt need an in office visit or should she go to urgent care or ED for f/u treatment for symptoms?

## 2019-05-10 NOTE — Telephone Encounter (Signed)
Urgent care or ED would be appropriate with worsening symptoms Thank you  Kathrin Ruddy, NP

## 2019-05-13 ENCOUNTER — Ambulatory Visit: Payer: BC Managed Care – PPO | Admitting: Psychology

## 2019-05-13 ENCOUNTER — Ambulatory Visit (INDEPENDENT_AMBULATORY_CARE_PROVIDER_SITE_OTHER): Payer: BC Managed Care – PPO | Admitting: Psychology

## 2019-05-13 DIAGNOSIS — F411 Generalized anxiety disorder: Secondary | ICD-10-CM

## 2019-05-27 ENCOUNTER — Other Ambulatory Visit: Payer: Self-pay | Admitting: Family Medicine

## 2019-05-27 ENCOUNTER — Ambulatory Visit (INDEPENDENT_AMBULATORY_CARE_PROVIDER_SITE_OTHER): Payer: BC Managed Care – PPO | Admitting: Psychology

## 2019-05-27 ENCOUNTER — Ambulatory Visit: Payer: BC Managed Care – PPO | Admitting: Psychology

## 2019-05-27 DIAGNOSIS — G43101 Migraine with aura, not intractable, with status migrainosus: Secondary | ICD-10-CM

## 2019-05-27 DIAGNOSIS — F411 Generalized anxiety disorder: Secondary | ICD-10-CM | POA: Diagnosis not present

## 2019-05-27 NOTE — Telephone Encounter (Signed)
Requested medication (s) are due for refill today: yes  Requested medication (s) are on the active medication list: yes  Last refill:  03/01/2019  Future visit scheduled: no  Notes to clinic:    This refill cannot be delegated     Requested Prescriptions  Pending Prescriptions Disp Refills   topiramate (TOPAMAX) 25 MG tablet [Pharmacy Med Name: TOPIRAMATE 25 MG TABLET] 90 tablet     Sig: TAKE 1 TABLET BY MOUTH EVERY DAY      Not Delegated - Neurology: Anticonvulsants - topiramate & zonisamide Failed - 05/27/2019  1:20 AM      Failed - This refill cannot be delegated      Failed - Cr in normal range and within 360 days    Creatinine, Ser  Date Value Ref Range Status  04/17/2017 0.71 0.57 - 1.00 mg/dL Final          Failed - CO2 in normal range and within 360 days    CO2  Date Value Ref Range Status  04/17/2017 22 20 - 29 mmol/L Final          Passed - Valid encounter within last 12 months    Recent Outpatient Visits           3 weeks ago Cystitis   [facility], Gerlene Burdock, NP   3 months ago Generalized anxiety disorder   [facility], [place], NP   4 months ago Migraine with aura and with status migrainosus, not intractable   [facility], Gerlene Burdock, NP   4 months ago Dysuria   Primary Care at [facility], Zoe A, MD   10 months ago Migraine with aura and with status migrainosus, not intractable   Primary Care at [facility], Manus Rudd, MD

## 2019-05-28 ENCOUNTER — Other Ambulatory Visit: Payer: Self-pay | Admitting: Registered Nurse

## 2019-05-28 DIAGNOSIS — G43101 Migraine with aura, not intractable, with status migrainosus: Secondary | ICD-10-CM

## 2019-06-10 ENCOUNTER — Ambulatory Visit (INDEPENDENT_AMBULATORY_CARE_PROVIDER_SITE_OTHER): Payer: BC Managed Care – PPO | Admitting: Psychology

## 2019-06-10 ENCOUNTER — Ambulatory Visit: Payer: BC Managed Care – PPO | Admitting: Psychology

## 2019-06-10 DIAGNOSIS — F411 Generalized anxiety disorder: Secondary | ICD-10-CM | POA: Diagnosis not present

## 2019-06-11 NOTE — Telephone Encounter (Signed)
Left detailed message on vm per ROI to check pt symptoms went away or if she had to seek treatment.  Advised pt I know it has been a month but just checking to see if she is better, if sought medical attention or if she need to be seen here.  Advised to call office with status. Dgaddy, CMA

## 2019-06-20 ENCOUNTER — Encounter: Payer: Self-pay | Admitting: Registered Nurse

## 2019-06-20 ENCOUNTER — Other Ambulatory Visit: Payer: Self-pay | Admitting: Family Medicine

## 2019-06-20 DIAGNOSIS — G43101 Migraine with aura, not intractable, with status migrainosus: Secondary | ICD-10-CM

## 2019-06-24 ENCOUNTER — Ambulatory Visit (INDEPENDENT_AMBULATORY_CARE_PROVIDER_SITE_OTHER): Payer: BC Managed Care – PPO | Admitting: Psychology

## 2019-06-24 ENCOUNTER — Ambulatory Visit: Payer: BC Managed Care – PPO | Admitting: Psychology

## 2019-06-24 DIAGNOSIS — F411 Generalized anxiety disorder: Secondary | ICD-10-CM

## 2019-07-08 ENCOUNTER — Ambulatory Visit: Payer: BC Managed Care – PPO | Admitting: Psychology

## 2019-07-08 ENCOUNTER — Ambulatory Visit (INDEPENDENT_AMBULATORY_CARE_PROVIDER_SITE_OTHER): Payer: BC Managed Care – PPO | Admitting: Psychology

## 2019-07-08 DIAGNOSIS — F411 Generalized anxiety disorder: Secondary | ICD-10-CM | POA: Diagnosis not present

## 2019-07-22 ENCOUNTER — Ambulatory Visit (INDEPENDENT_AMBULATORY_CARE_PROVIDER_SITE_OTHER): Payer: BC Managed Care – PPO | Admitting: Psychology

## 2019-07-22 ENCOUNTER — Ambulatory Visit: Payer: BC Managed Care – PPO | Admitting: Psychology

## 2019-07-22 DIAGNOSIS — F411 Generalized anxiety disorder: Secondary | ICD-10-CM

## 2019-08-05 ENCOUNTER — Ambulatory Visit (INDEPENDENT_AMBULATORY_CARE_PROVIDER_SITE_OTHER): Payer: BC Managed Care – PPO | Admitting: Psychology

## 2019-08-05 ENCOUNTER — Ambulatory Visit: Payer: BC Managed Care – PPO | Admitting: Psychology

## 2019-08-05 DIAGNOSIS — F411 Generalized anxiety disorder: Secondary | ICD-10-CM | POA: Diagnosis not present

## 2019-08-19 ENCOUNTER — Ambulatory Visit: Payer: BC Managed Care – PPO | Admitting: Psychology

## 2019-09-02 ENCOUNTER — Ambulatory Visit (INDEPENDENT_AMBULATORY_CARE_PROVIDER_SITE_OTHER): Payer: BC Managed Care – PPO | Admitting: Psychology

## 2019-09-02 ENCOUNTER — Ambulatory Visit: Payer: BC Managed Care – PPO | Admitting: Psychology

## 2019-09-02 DIAGNOSIS — F411 Generalized anxiety disorder: Secondary | ICD-10-CM | POA: Diagnosis not present

## 2019-09-16 ENCOUNTER — Ambulatory Visit (INDEPENDENT_AMBULATORY_CARE_PROVIDER_SITE_OTHER): Payer: BC Managed Care – PPO | Admitting: Psychology

## 2019-09-16 DIAGNOSIS — F411 Generalized anxiety disorder: Secondary | ICD-10-CM

## 2019-09-21 ENCOUNTER — Other Ambulatory Visit: Payer: Self-pay | Admitting: Family Medicine

## 2019-09-21 DIAGNOSIS — G43101 Migraine with aura, not intractable, with status migrainosus: Secondary | ICD-10-CM

## 2019-09-21 NOTE — Telephone Encounter (Signed)
Requested  medications are  due for refill today yes  Requested medications are on the active medication list yes  Last refill 2/5  Last visit August 2020  Future visit scheduled no  Notes to clinic Not Delegated

## 2019-09-30 ENCOUNTER — Ambulatory Visit: Payer: BC Managed Care – PPO | Admitting: Psychology

## 2019-10-14 ENCOUNTER — Ambulatory Visit (INDEPENDENT_AMBULATORY_CARE_PROVIDER_SITE_OTHER): Payer: BC Managed Care – PPO | Admitting: Psychology

## 2019-10-14 DIAGNOSIS — F411 Generalized anxiety disorder: Secondary | ICD-10-CM

## 2019-10-28 ENCOUNTER — Other Ambulatory Visit: Payer: Self-pay

## 2019-10-28 ENCOUNTER — Encounter (INDEPENDENT_AMBULATORY_CARE_PROVIDER_SITE_OTHER): Payer: Self-pay | Admitting: Family Medicine

## 2019-10-28 ENCOUNTER — Ambulatory Visit (INDEPENDENT_AMBULATORY_CARE_PROVIDER_SITE_OTHER): Payer: BC Managed Care – PPO | Admitting: Family Medicine

## 2019-10-28 VITALS — BP 132/78 | HR 79 | Temp 98.0°F | Ht 60.0 in | Wt 156.0 lb

## 2019-10-28 DIAGNOSIS — E669 Obesity, unspecified: Secondary | ICD-10-CM

## 2019-10-28 DIAGNOSIS — Z0289 Encounter for other administrative examinations: Secondary | ICD-10-CM

## 2019-10-28 DIAGNOSIS — G43809 Other migraine, not intractable, without status migrainosus: Secondary | ICD-10-CM

## 2019-10-28 DIAGNOSIS — R0602 Shortness of breath: Secondary | ICD-10-CM

## 2019-10-28 DIAGNOSIS — Z6830 Body mass index (BMI) 30.0-30.9, adult: Secondary | ICD-10-CM

## 2019-10-28 DIAGNOSIS — R5383 Other fatigue: Secondary | ICD-10-CM

## 2019-10-28 DIAGNOSIS — F329 Major depressive disorder, single episode, unspecified: Secondary | ICD-10-CM | POA: Diagnosis not present

## 2019-10-28 DIAGNOSIS — F419 Anxiety disorder, unspecified: Secondary | ICD-10-CM

## 2019-10-28 DIAGNOSIS — E282 Polycystic ovarian syndrome: Secondary | ICD-10-CM

## 2019-10-28 DIAGNOSIS — Z9189 Other specified personal risk factors, not elsewhere classified: Secondary | ICD-10-CM

## 2019-10-28 NOTE — Progress Notes (Signed)
Chief Complaint:   OBESITY Aimee Gray (MR# [id number]) is a 49 y.o. female who presents for evaluation and treatment of obesity and related comorbidities. Current BMI is Body mass index is 30.47 kg/m. Aimee Gray has been struggling with her weight for many years and has been unsuccessful in either losing weight, maintaining weight loss, or reaching her healthy weight goal.  Aimee Gray is currently in the action stage of change and ready to dedicate time achieving and maintaining a healthier weight. Aimee Gray is interested in becoming our patient and working on intensive lifestyle modifications including (but not limited to) diet and exercise for weight loss.  Aimee Gray was told about the program from a friend.  She is lactose intolerant.  She drinks Fairlife and can tolerate yogurt.  Breakfast is typically a smoothie (8 ounces of Fairlife, banana, 2 tbsp PB2, 1 tsp flax, and Vega dark chocolate protein (15 grams of protein).  She will feel full until lunch.  Lunch will be yogurt with fruit (1/4 cup strawberries) granola and she feels satisfied.  Dinner will consist of quesadillas or barbacue 3 pounds for 5 days for 4 people, with rice and corn tortillas.  After dinner she will have coke and a bowl of ice cream, candy.  Aimee Gray's habits were reviewed today and are as follows: Her family eats meals together, she thinks her family will eat healthier with her, her desired weight loss is 45 pounds, she started gaining weight after childbirth, her heaviest weight ever was 160 pounds, she is a picky eater, she craves Coca Cola, Starbucks, candy ice cream, baked goods, cheeseburgers, she snacks frequently in the evenings, she skips breakfast most days, she is frequently drinking liquids with calories, she frequently makes poor food choices, she frequently eats larger portions than normal and she struggles with emotional eating.  Depression Screen Aimee Gray's Food and Mood (modified PHQ-9) score was  21.  Depression screen PHQ 2/9 10/28/2019  Decreased Interest 3  Down, Depressed, Hopeless 3  PHQ - 2 Score 6  Altered sleeping 2  Tired, decreased energy 3  Change in appetite 3  Feeling bad or failure about yourself  3  Trouble concentrating 3  Moving slowly or fidgety/restless 1  Suicidal thoughts 0  PHQ-9 Score 21  Difficult doing work/chores Somewhat difficult   Subjective:   1. Other fatigue Aimee Gray denies daytime somnolence and admits to waking up still tired. Patent has a history of symptoms of daytime fatigue and snoring. [name] generally gets 7 hours of sleep per night, and states that she has poor sleep quality. Snoring is present. Apneic episodes are not present. Epworth Sleepiness Score is 3.  2. SOB (shortness of breath) on exertion Aimee Gray notes increasing shortness of breath with exercising and seems to be worsening over time with weight gain. She notes getting out of breath sooner with activity than she used to. This has gotten worse recently. Aimee Gray denies shortness of breath at rest or orthopnea.  3. Anxiety and depression Aimee Gray says she has been on numerous medications; Paxil, Lexapro, Wellbutrin, Xanax, Buspar, sertraline, Celexa, and all caused significant impairment of libido.  She has cyclical anxiety and depression.  She is working with a Transport planner.  4. Other migraine without status migrainosus, not intractable Aimee Gray has a prescription for a rescue triptan.  She tried propranolol and Topamax.  The latter caused significant mental slowing.  5. PCOS (polycystic ovarian syndrome) Aimee Gray is unsure if she has a definitive diagnosis.  She has ovarian cysts and  hirsutism.  6. At risk for diabetes mellitus Aimee Gray is at a higher than average risk for cardiovascular disease due to obesity.   Assessment/Plan:   1. Other fatigue Aimee Gray does feel that her weight is causing her energy to be lower than it should be. Fatigue may be related to obesity, depression  or many other causes. Labs will be ordered, and in the meanwhile, Aimee Gray will focus on self care including making healthy food choices, increasing physical activity and focusing on stress reduction. - EKG 12-Lead - Comprehensive metabolic panel - CBC with Differential/Platelet - Hemoglobin A1c - Insulin, random - Lipid Panel With LDL/HDL Ratio - VITAMIN D 25 Hydroxy (Vit-D Deficiency, Fractures) - Vitamin B12 - Folate - T3 - T4 - TSH  2. SOB (shortness of breath) on exertion Aimee Gray does feel that she gets out of breath more easily that she used to when she exercises. Aimee Gray's shortness of breath appears to be obesity related and exercise induced. She has agreed to work on weight loss and gradually increase exercise to treat her exercise induced shortness of breath. Will continue to monitor closely. - Comprehensive metabolic panel - CBC with Differential/Platelet - Hemoglobin A1c - Insulin, random - Lipid Panel With LDL/HDL Ratio - VITAMIN D 25 Hydroxy (Vit-D Deficiency, Fractures) - Vitamin B12 - Folate - T3 - T4 - TSH  3. Anxiety and depression Follow-up next appointment.  Aimee Gray will continue seeing her therapist biweekly.  Patient was referred to Dr. Dewaine Conger, our Bariatric Psychologist, for evaluation due to her elevated PHQ-9 score and significant struggles with emotional eating.  4. Other migraine without status migrainosus, not intractable Follow-up at next appointment.  5. PCOS (polycystic ovarian syndrome) Will check labs today. - Hemoglobin A1c - Insulin, random  6. At risk for diabetes mellitus Aimee Gray was given approximately 15 minutes of diabetes education and counseling today. We discussed intensive lifestyle modifications today with an emphasis on weight loss as well as increasing exercise and decreasing simple carbohydrates in her diet. We also reviewed medication options with an emphasis on risk versus benefit of those discussed.   Repetitive spaced  learning was employed today to elicit superior memory formation and behavioral change.  7. Class 1 obesity with serious comorbidity and body mass index (BMI) of 30.0 to 30.9 in adult, unspecified obesity type Aimee Gray is currently in the action stage of change and her goal is to continue with weight loss efforts. I recommend Aimee Gray begin the structured treatment plan as follows:  She has agreed to the Category 2 Plan +100 calories.  Exercise goals: No exercise has been prescribed at this time.   Behavioral modification strategies: increasing lean protein intake, meal planning and cooking strategies, keeping healthy foods in the home and avoiding temptations.  She was informed of the importance of frequent follow-up visits to maximize her success with intensive lifestyle modifications for her multiple health conditions. She was informed we would discuss her lab results at her next visit unless there is a critical issue that needs to be addressed sooner. Aimee Gray agreed to keep her next visit at the agreed upon time to discuss these results.  Objective:   Blood pressure 132/78, pulse 79, temperature 98 F (36.7 C), temperature source Oral, height 5' (1.524 m), weight 156 lb (70.8 kg), SpO2 99 %. Body mass index is 30.47 kg/m.  EKG: Normal sinus rhythm, rate 80 bpm with low voltage.  Indirect Calorimeter completed today shows a VO2 of 240 and a REE of 1668.  Her calculated basal  metabolic rate is 4818 thus her basal metabolic rate is better than expected.  General: Cooperative, alert, well developed, in no acute distress. HEENT: Conjunctivae and lids unremarkable. Cardiovascular: Regular rhythm.  Lungs: Normal work of breathing. Neurologic: No focal deficits.   Lab Results  Component Value Date   CREATININE 0.71 04/17/2017   BUN 9 04/17/2017   NA 138 04/17/2017   K 3.7 04/17/2017   CL 101 04/17/2017   CO2 22 04/17/2017   Lab Results  Component Value Date   ALT 13 04/17/2017   AST  13 04/17/2017   ALKPHOS 50 04/17/2017   BILITOT 0.5 04/17/2017   Lab Results  Component Value Date   TSH 2.300 04/17/2017   Lab Results  Component Value Date   CHOL 144 04/17/2017   HDL 42 04/17/2017   LDLCALC 90 04/17/2017   TRIG 60 04/17/2017   CHOLHDL 3.4 04/17/2017   Lab Results  Component Value Date   WBC 9.4 04/17/2017   HGB 14.1 04/17/2017   HCT 41.3 04/17/2017   MCV 90 04/17/2017   PLT 373 04/17/2017   Attestation Statements:   Reviewed by clinician on day of visit: allergies, medications, problem list, medical history, surgical history, family history, social history, and previous encounter notes.  This is the patient's first visit at [facility]. The patient's NEW PATIENT PACKET was reviewed at length. Included in the packet: current and past health history, medications, allergies, ROS, gynecologic history (women only), surgical history, family history, social history, weight history, weight loss surgery history (for those that have had weight loss surgery), nutritional evaluation, mood and food questionnaire, PHQ9, Epworth questionnaire, sleep habits questionnaire, patient life and health improvement goals questionnaire. These will all be scanned into the patient's chart under media.   During the visit, I independently reviewed the patient's EKG, bioimpedance scale results, and indirect calorimeter results. I used this information to tailor a meal plan for the patient that will help her to lose weight and will improve her obesity-related conditions going forward. I performed a medically necessary appropriate examination and/or evaluation. I discussed the assessment and treatment plan with the patient. The patient was provided an opportunity to ask questions and all were answered. The patient agreed with the plan and demonstrated an understanding of the instructions. Labs were ordered at this visit and will be reviewed at the next visit unless more critical  results need to be addressed immediately. Clinical information was updated and documented in the EMR.   Time spent on visit including pre-visit chart review and post-visit care was 45 minutes.   A separate 15 minutes was spent on risk counseling (see above).    I, Insurance claims handler, CMA, am acting as transcriptionist for Reuben Likes, MD.  I have reviewed the above documentation for accuracy and completeness, and I agree with the above. - Katherina Mires, MD

## 2019-10-29 LAB — CBC WITH DIFFERENTIAL/PLATELET
Basophils Absolute: 0.1 10*3/uL (ref 0.0–0.2)
Basos: 1 %
EOS (ABSOLUTE): 0.2 10*3/uL (ref 0.0–0.4)
Eos: 2 %
Hematocrit: 43.7 % (ref 34.0–46.6)
Hemoglobin: 14.8 g/dL (ref 11.1–15.9)
Immature Grans (Abs): 0 10*3/uL (ref 0.0–0.1)
Immature Granulocytes: 0 %
Lymphocytes Absolute: 1.9 10*3/uL (ref 0.7–3.1)
Lymphs: 27 %
MCH: 31.2 pg (ref 26.6–33.0)
MCHC: 33.9 g/dL (ref 31.5–35.7)
MCV: 92 fL (ref 79–97)
Monocytes Absolute: 0.4 10*3/uL (ref 0.1–0.9)
Monocytes: 6 %
Neutrophils Absolute: 4.5 10*3/uL (ref 1.4–7.0)
Neutrophils: 64 %
Platelets: 319 10*3/uL (ref 150–450)
RBC: 4.74 x10E6/uL (ref 3.77–5.28)
RDW: 12.2 % (ref 11.7–15.4)
WBC: 7 10*3/uL (ref 3.4–10.8)

## 2019-10-29 LAB — VITAMIN B12: Vitamin B-12: 245 pg/mL (ref 232–1245)

## 2019-10-29 LAB — COMPREHENSIVE METABOLIC PANEL
ALT: 17 IU/L (ref 0–32)
AST: 16 IU/L (ref 0–40)
Albumin/Globulin Ratio: 2.4 — ABNORMAL HIGH (ref 1.2–2.2)
Albumin: 4.5 g/dL (ref 3.8–4.8)
Alkaline Phosphatase: 67 IU/L (ref 48–121)
BUN/Creatinine Ratio: 10 (ref 9–23)
BUN: 7 mg/dL (ref 6–24)
Bilirubin Total: <0.2 mg/dL (ref 0.0–1.2)
CO2: 26 mmol/L (ref 20–29)
Calcium: 9.1 mg/dL (ref 8.7–10.2)
Chloride: 102 mmol/L (ref 96–106)
Creatinine, Ser: 0.71 mg/dL (ref 0.57–1.00)
GFR calc Af Amer: 118 mL/min/{1.73_m2} (ref >59)
GFR calc non Af Amer: 102 mL/min/{1.73_m2} (ref >59)
Globulin, Total: 1.9 g/dL (ref 1.5–4.5)
Glucose: 84 mg/dL (ref 65–99)
Potassium: 4.2 mmol/L (ref 3.5–5.2)
Sodium: 139 mmol/L (ref 134–144)
Total Protein: 6.4 g/dL (ref 6.0–8.5)

## 2019-10-29 LAB — INSULIN, RANDOM: INSULIN: 13.9 u[IU]/mL (ref 2.6–24.9)

## 2019-10-29 LAB — VITAMIN D 25 HYDROXY (VIT D DEFICIENCY, FRACTURES): Vit D, 25-Hydroxy: 32.5 ng/mL (ref 30.0–100.0)

## 2019-10-29 LAB — T3: T3, Total: 106 ng/dL (ref 71–180)

## 2019-10-29 LAB — T4: T4, Total: 5.2 ug/dL (ref 4.5–12.0)

## 2019-10-29 LAB — LIPID PANEL WITH LDL/HDL RATIO
Cholesterol, Total: 167 mg/dL (ref 100–199)
HDL: 45 mg/dL (ref >39)
LDL Chol Calc (NIH): 106 mg/dL — ABNORMAL HIGH (ref 0–99)
LDL/HDL Ratio: 2.4 ratio (ref 0.0–3.2)
Triglycerides: 84 mg/dL (ref 0–149)
VLDL Cholesterol Cal: 16 mg/dL (ref 5–40)

## 2019-10-29 LAB — TSH: TSH: 2.85 u[IU]/mL (ref 0.450–4.500)

## 2019-10-29 LAB — FOLATE: Folate: 2.7 ng/mL — ABNORMAL LOW (ref >3.0)

## 2019-10-29 LAB — HEMOGLOBIN A1C
Est. average glucose Bld gHb Est-mCnc: 103 mg/dL
Hgb A1c MFr Bld: 5.2 % (ref 4.8–5.6)

## 2019-10-30 NOTE — Progress Notes (Signed)
Office: [phone]  /  Fax: [phone]    Date: November 04, 2019   Appointment Start Time: 9:00am Duration: 46 minutes Provider: Glennie Isle, Psy.D. Type of Session: Intake for Individual Therapy  Location of Patient: Home Location of Provider: Provider's Home Type of Contact: Telepsychological Visit via MyChart Video Visit  Informed Consent: Prior to proceeding with today's appointment, two pieces of identifying information were obtained. In addition, [name]'s physical location at the time of this appointment was obtained as well a phone number she could be reached at in the event of technical difficulties. [name] and this provider participated in today's telepsychological service.   The provider's role was explained to [name] The provider reviewed and discussed issues of confidentiality, privacy, and limits therein (e.g., reporting obligations). In addition to verbal informed consent, written informed consent for psychological services was obtained prior to the initial appointment. Since the clinic is not a 24/7 crisis center, mental health emergency resources were shared and this  provider explained MyChart, e-mail, voicemail, and/or other messaging systems should be utilized only for non-emergency reasons. This provider also explained that information obtained during appointments will be placed in [facility] medical record and relevant information will be shared with other providers at [facility] for coordination of care. Moreover, Athea agreed information may be shared with other [facility] providers as needed for coordination of care. By signing the service agreement document, Shelisa provided written consent for coordination of care. Prior to initiating telepsychological services, Mazzy completed an informed consent document, which included the development of a safety plan (i.e., an emergency contact, nearest emergency room, and emergency  resources) in the event of an emergency/crisis. Richie expressed understanding of the rationale of the safety plan. Taimane verbally acknowledged understanding she is ultimately responsible for understanding her insurance benefits for telepsychological and in-person services. This provider also reviewed confidentiality, as it relates to telepsychological services, as well as the rationale for telepsychological services (i.e., to reduce exposure risk to COVID-19). Akeiba  acknowledged understanding that appointments cannot be recorded without both party consent and she is aware she is responsible for securing confidentiality on her end of the session. Terin verbally consented to proceed.  Chief Complaint/HPI: Aimee Gray was referred by [name] due to anxiety and depression. Per the note for the initial visit with [name] on October 28, 2019, "[name] says she has been on numerous medications; Paxil, Lexapro, Wellbutrin, Xanax, Buspar, sertraline, Celexa, and all caused significant impairment of libido.  She has cyclical anxiety and depression.  She is working with a therapist." The note for the initial appointment with [name] indicated the following: "[name]'s habits were reviewed today and are as follows: Her family eats meals together, she thinks her family will eat healthier with her, her desired weight loss is 45 pounds, she started gaining weight after childbirth, her heaviest weight ever was 160 pounds, she is a picky eater, she craves Coca Cola, Starbucks, candy ice cream, baked goods, cheeseburgers, she snacks frequently in the evenings, she skips breakfast most days, she is frequently drinking liquids with calories, she frequently makes poor food choices, she frequently eats larger portions than normal and she struggles with emotional eating." [name]'s Food and Mood (modified PHQ-9) score on October 28, 2019 was 21.  During today's appointment, [name] was verbally  administered a questionnaire assessing various behaviors related to emotional eating. [name] endorsed the following: overeat when you are celebrating, experience food cravings on a regular basis,  eat certain foods when you are anxious, stressed, depressed, or your feelings are hurt, use food to help you cope with emotional situations, find food is comforting to you, overeat when you are angry or upset, overeat when you are worried about something, overeat frequently when you are bored or lonely, not worry about what you eat when you are in a good mood, overeat when you are alone, but eat much less when you are with other people and eat as a reward. [name] believes the onset of emotional eating was likely as a teenager and described the frequency of emotional eating as "4-5 times a week" prior to starting with the clinic. In addition, [name] denied a history of binge eating, but discussed eating multiple snacks in the evening after her children went to sleep and described feeling out of control. She stated she would previously restrict calories during the day to consume certain foods/beverages later in the day. She denied a history of purging or laxative use for weight loss. [name] noted she has never been diagnosed with an eating disorder. She also denied a history of treatment for emotional eating.  Moreover, [name] indicated boredom, stress, and feeling anxious triggers emotional eating, whereas feeling good by self and spending time in the garden makes emotional eating better. She added, "I think about food all the time," especially since starting her current meal plan. Furthermore, [name] denied other problems of concern.    Mental Status Examination:  Appearance: well groomed and appropriate hygiene  Behavior: appropriate to circumstances Mood: euthymic Affect: mood congruent Speech: normal in rate, volume, and tone Eye Contact: appropriate Psychomotor Activity: appropriate Gait: unable to  assess Thought Process: linear, logical, and goal directed  Thought Content/Perception: denies suicidal and homicidal ideation, plan, and intent and no hallucinations, delusions, bizarre thinking or behavior reported or observed Orientation: time, person, place and purpose of appointment Memory/Concentration: memory, attention, language, and fund of knowledge intact  Insight/Judgment: good  Family & Psychosocial History: Loreen reported she is married and she has two sons (ages 36 and 80). 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 Additionally, Cassara shared her highest level of education obtained is a bachelor's degree. Currently, Melva's social support system consists of her husband, couple of close friends, mother, and sister. Moreover, Kiari stated she resides with her husband, children, couple dogs, and cat.   Medical History:  Past Medical History:  Diagnosis Date  . Allergy   . Anxiety   . Back pain   . Depression   . Infertility, female   . Lactose intolerance   . Migraines   . Palpitations   . PCOS (polycystic ovarian syndrome)    Past Surgical History:  Procedure Laterality Date  . CESAREAN SECTION     Current Outpatient Medications on File Prior to Visit  Medication Sig Dispense Refill  . levonorgestrel (MIRENA) 20 MCG/24HR IUD 1 each by Intrauterine route once.    . Melatonin 5 MG CAPS Take 5 mg by mouth.    . rizatriptan ([name]-MLT) 10 MG disintegrating tablet DISSOLVE 1 TAB BY MOUTH AS NEEDED FOR MIGRAINE. MAY REPEAT IN 2 HOURS IF NEEDED. NEEDS OFFICE VISIT 10 tablet 6   No current facility-administered medications on file prior to visit.  Shalita denied a history of head injuries and loss of consciousness.    Mental Health History: Torrin reported she currently meets with Myra Gianotti, LCSW with [facility] to address coping with anxiety. She reported Ms. Marcello Moores  is aware she is meeting with this  provider and she is receptive to signing an authorization for coordination of care if needed. Bettyjean noted their next appointment is on November 29, 2019, adding they typically meet every other week. Previously, [name] attended therapeutic services in childhood when her parents divorced and later met with the same provider to address postpartum depression. Desarai reported there is no history of hospitalizations for psychiatric concerns, and she has never met with a psychiatrist. Ruweyda stated she is currently not prescribed psychotropic medications. Mahdiya endorsed a family history of mental health related concerns. She noted her sister and mother suffer from depression and her maternal great grandmother was a "paranoid schizophrenic." She added her father is diagnosed with PTSD. Azariya reported there is no history of trauma including psychological, physical  and sexual abuse. She described her father as "neglectful," noting he "abandoned" her.  Wendelin described her typical mood lately as "good, pretty even keel," noting she feels "anxious about the weight loss." Aside from concerns noted above and endorsed on the PHQ-9 and GAD-7, Lakeisa reported experiencing low self-esteem, negative self-image, and ongoing worry about her youngest's well-being due to a medical condition. Nakiah denied current alcohol use. She denied current tobacco use. She denied current illicit/recreational substance use, noting a history of marijuana use. Regarding caffeine intake, Sarahbeth reported consuming two cups of coffee daily. Furthermore, [name] indicated she is not experiencing the following: hallucinations and delusions, paranoia, symptoms of mania , social withdrawal, crying spells, decreased motivation and panic attacks. She also denied history of and current suicidal ideation, plan, and intent; history of and current homicidal ideation, plan, and intent; and history of and current engagement in self-harm.  The following  strengths were reported by [name]: strong minded, strong opinions, loving, empathetic, kind, and brave. The following strengths were observed by this provider: ability to express thoughts and feelings during the therapeutic session, ability to establish and benefit from a therapeutic relationship, willingness to work toward established goal(s) with the clinic and ability to engage in reciprocal conversation.   Legal History: Shona reported there is no history of legal involvement.   Structured Assessments Results: The Patient Health Questionnaire-9 (PHQ-9) is a self-report measure that assesses symptoms and severity of depression over the course of the last two weeks. Turner obtained a score of 11 suggesting moderate depression. Marguerita finds the endorsed symptoms to be somewhat difficult. [0= Not at all; 1= Several days; 2= More than half the days; 3= Nearly every day] Little interest or pleasure in doing things 0  Feeling down, depressed, or hopeless 0  Trouble falling or staying asleep, or sleeping too much 3  Feeling tired or having little energy 2  Poor appetite or overeating 1  Feeling bad about yourself --- or that you are a failure or have let yourself or your family down 2  Trouble concentrating on things, such as reading the newspaper or watching television 3  Moving or speaking so slowly that other people could have noticed? Or the opposite --- being so fidgety or restless that you have been moving around a lot more than usual 0  Thoughts that you would be better off dead or hurting yourself in some way 0  PHQ-9 Score 11    The Generalized Anxiety Disorder-7 (GAD-7) is a brief self-report measure that assesses symptoms of anxiety over the course of the last two weeks. Demiyah obtained a score of 4 suggesting minimal anxiety. Aizley finds the endorsed symptoms to be somewhat difficult. [  0= Not at all; 1= Several days; 2= Over half the days; 3= Nearly every day] Feeling nervous,  anxious, on edge 0  Not being able to stop or control worrying 1  Worrying too much about different things 0  Trouble relaxing 1  Being so restless that it's hard to sit still 0  Becoming easily annoyed or irritable 2  Feeling afraid as if something awful might happen 0  GAD-7 Score 4   Interventions:  Conducted a chart review Focused on rapport building Verbally administered PHQ-9 and GAD-7 for symptom monitoring Verbally administered Food & Mood questionnaire to assess various behaviors related to emotional eating Provided emphatic reflections and validation Collaborated with patient on a treatment goal  Psychoeducation provided regarding physical versus emotional hunger  Provisional DSM-5 Diagnosis(es): 307.59 (F50.8) Other Specified Feeding or Eating Disorder, Emotional Eating Behaviors and 300.00 (F41.9) Unspecified Anxiety Disorder  Plan: Layci appears able and willing to participate as evidenced by collaboration on a treatment goal, engagement in reciprocal conversation, and asking questions as needed for clarification. The next appointment will be scheduled in two weeks, which will be via MyChart Video Visit. The following treatment goal was established: increase coping skills. This provider will regularly review the treatment plan and medical chart to keep informed of status changes. Jermeka expressed understanding and agreement with the initial treatment plan of care. Krissa will be sent a handout via e-mail to utilize between now and the next appointment to increase awareness of hunger patterns and subsequent eating. [name] provided verbal consent during today's appointment for this provider to send the handout via e-mail.

## 2019-11-01 ENCOUNTER — Ambulatory Visit (INDEPENDENT_AMBULATORY_CARE_PROVIDER_SITE_OTHER): Payer: BC Managed Care – PPO | Admitting: Psychology

## 2019-11-01 DIAGNOSIS — F411 Generalized anxiety disorder: Secondary | ICD-10-CM

## 2019-11-04 ENCOUNTER — Telehealth (INDEPENDENT_AMBULATORY_CARE_PROVIDER_SITE_OTHER): Payer: BC Managed Care – PPO | Admitting: Psychology

## 2019-11-04 ENCOUNTER — Other Ambulatory Visit: Payer: Self-pay

## 2019-11-04 DIAGNOSIS — F5089 Other specified eating disorder: Secondary | ICD-10-CM | POA: Diagnosis not present

## 2019-11-04 DIAGNOSIS — F419 Anxiety disorder, unspecified: Secondary | ICD-10-CM | POA: Diagnosis not present

## 2019-11-04 NOTE — Progress Notes (Signed)
  Office: [phone]  /  Fax: [phone]    Date: November 18, 2019   Appointment Start Time: 8:30am Duration: 27 minutes Provider: Lawerance Cruel, Psy.D. Type of Session: Individual Therapy  Location of Patient: Home Location of Provider: [facility] Type of Contact: Telepsychological Visit via MyChart Video Visit  Session Content: Daisee is a 49 y.o. female presenting via MyChart Video Visit for a follow-up appointment to address the previously established treatment goal of increasing coping skills. Today's appointment was a telepsychological visit due to COVID-19. [name] provided verbal consent for today's telepsychological appointment and she is aware she is responsible for securing confidentiality on her end of the session. Prior to proceeding with today's appointment, Shantera's physical location at the time of this appointment was obtained as well a phone number she could be reached at in the event of technical difficulties. [name] and this provider participated in today's telepsychological service.   This provider conducted a brief check-in. Maycee shared about recent events, including deviations from the meal plan on her birthday and starting Trintellix. She described feeling guilty about the choices made on her birthday. This was explored and processed. Psychoeducation regarding all or nothing thinking was provided. Additionally, physical and emotional hunger were reviewed. She acknowledged experiencing emotional hunger in the evenings. Marchel was receptive to today's appointment as evidenced by openness to sharing and responsiveness to feedback.  Mental Status Examination:  Appearance: well groomed and appropriate hygiene  Behavior: appropriate to circumstances Mood: euthymic Affect: mood congruent Speech: normal in rate, volume, and tone Eye Contact: appropriate Psychomotor Activity: appropriate Gait: unable to assess Thought Process: linear, logical, and  goal directed  Thought Content/Perception: no hallucinations, delusions, bizarre thinking or behavior reported or observed and no evidence of suicidal and homicidal ideation, plan, and intent Orientation: time, person, place, and purpose of appointment Memory/Concentration: memory, attention, language, and fund of knowledge intact  Insight/Judgment: good  Interventions:  Conducted a brief chart review Provided empathic reflections and validation Reviewed content from the previous session Employed supportive psychotherapy interventions to facilitate reduced distress and to improve coping skills with identified stressors Psychoeducation provided regarding all-or-nothing thinking  DSM-5 Diagnosis(es): 307.59 (F50.8) Other Specified Feeding or Eating Disorder, Emotional Eating Behaviors and 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 Naraya has demonstrated progress in her goal as evidenced by increased awareness of hunger patterns.   Plan: The next appointment will be scheduled in two weeks, which will be via MyChart Video Visit. The next session will focus on working towards the established treatment goal.

## 2019-11-11 ENCOUNTER — Encounter (INDEPENDENT_AMBULATORY_CARE_PROVIDER_SITE_OTHER): Payer: Self-pay | Admitting: Family Medicine

## 2019-11-11 ENCOUNTER — Other Ambulatory Visit: Payer: Self-pay

## 2019-11-11 ENCOUNTER — Ambulatory Visit (INDEPENDENT_AMBULATORY_CARE_PROVIDER_SITE_OTHER): Payer: BC Managed Care – PPO | Admitting: Family Medicine

## 2019-11-11 ENCOUNTER — Ambulatory Visit: Payer: BC Managed Care – PPO | Admitting: Psychology

## 2019-11-11 VITALS — BP 122/79 | HR 85 | Temp 98.1°F | Ht 60.0 in | Wt 149.0 lb

## 2019-11-11 DIAGNOSIS — E669 Obesity, unspecified: Secondary | ICD-10-CM

## 2019-11-11 DIAGNOSIS — Z9189 Other specified personal risk factors, not elsewhere classified: Secondary | ICD-10-CM | POA: Diagnosis not present

## 2019-11-11 DIAGNOSIS — E559 Vitamin D deficiency, unspecified: Secondary | ICD-10-CM | POA: Diagnosis not present

## 2019-11-11 DIAGNOSIS — E8881 Metabolic syndrome: Secondary | ICD-10-CM | POA: Diagnosis not present

## 2019-11-11 DIAGNOSIS — E7849 Other hyperlipidemia: Secondary | ICD-10-CM | POA: Diagnosis not present

## 2019-11-11 DIAGNOSIS — Z6830 Body mass index (BMI) 30.0-30.9, adult: Secondary | ICD-10-CM

## 2019-11-11 DIAGNOSIS — F329 Major depressive disorder, single episode, unspecified: Secondary | ICD-10-CM

## 2019-11-11 DIAGNOSIS — F419 Anxiety disorder, unspecified: Secondary | ICD-10-CM | POA: Diagnosis not present

## 2019-11-11 DIAGNOSIS — F32A Depression, unspecified: Secondary | ICD-10-CM

## 2019-11-11 MED ORDER — VORTIOXETINE HBR 10 MG PO TABS: 10.0000 mg | ORAL_TABLET | Freq: Every day | ORAL | 0 refills | Status: DC

## 2019-11-11 MED ORDER — VITAMIN D (ERGOCALCIFEROL) 1.25 MG (50000 UNIT) PO CAPS: 50000.0000 [IU] | ORAL_CAPSULE | ORAL | 0 refills | Status: DC

## 2019-11-12 NOTE — Progress Notes (Signed)
Chief Complaint:   OBESITY Aimee Gray is here to discuss her progress with her obesity treatment plan along with follow-up of her obesity related diagnoses. Aimee Gray is on the Category 2 Plan + 100 calories and states she is following her eating plan approximately 75% of the time. Aimee Gray states she is hiking and walking 1 mile or more 2-3 times per week.  Today's visit was #: 2 Starting weight: 156 lbs Starting date: 10/28/2019 Today's weight: 149 lbs Today's date: 11/11/2019 Total lbs lost to date: 7 Total lbs lost since last in-office visit: 7  Interim History: Aimee Gray notes the meal plan was harder than she expected. She found her meals were more carbohydrate heavy than she realized and she missed [employer] and soda. She realized she's a fairly picky eater. She went camping this weekend. She is often getting in 6 oz of meat at dinner. She is doing laughing cow cheese and crackers, and Yasso bars for snacks.  Subjective:   1. Vitamin D deficiency Aimee Gray is not on Vit D, and she notes fatigue. I discussed labs with the patient today.  2. Insulin resistance Aimee Gray's A1c is 5.2 and insulin 13.9. She notes carbohydrate cravings in forms of soda and sweets. She has a history of polycystic ovarian syndrome. I discussed labs with the patient today.  3. Other hyperlipidemia Aimee Gray's LDL is 106, HDL 45, and triglycerides 84. She is not on statin. I discussed labs with the patient today.  4. Anxiety and depression Aimee Gray's anxiety is presenting as irritability. She notes significant sexual side effects with numerous SSRIs and SNRIs.  5. At risk for diabetes mellitus Aimee Gray is at higher than average risk for developing diabetes due to her obesity.   Assessment/Plan:   1. Vitamin D deficiency Low Vitamin D level contributes to fatigue and are associated with obesity, breast, and colon cancer. Aimee Gray agreed to start prescription Vitamin D 50,000 IU every week with no refills. She will  follow-up for routine testing of Vitamin D, at least 2-3 times per year to avoid over-replacement.  - Vitamin D, Ergocalciferol, (DRISDOL) 1.25 MG (50000 UNIT) CAPS capsule; Take 1 capsule (50,000 Units total) by mouth every 7 (seven) days.  Dispense: 4 capsule; Refill: 0  2. Insulin resistance Aimee Gray will continue to work on weight loss, exercise, and decreasing simple carbohydrates to help decrease the risk of diabetes. we will repeat labs in 3 months. Aimee Gray agreed to follow-up with [name] as directed to closely monitor her progress.  3. Other hyperlipidemia Cardiovascular risk and specific lipid/LDL goals reviewed. We discussed several lifestyle modifications today and Aimee Gray will continue to work on diet, exercise and weight loss efforts. We will repeat labs in 3 months. Orders and follow up as documented in patient record.   Counseling Intensive lifestyle modifications are the first line treatment for this issue. . Dietary changes: Increase soluble fiber. Decrease simple carbohydrates. . Exercise changes: Moderate to vigorous-intensity aerobic activity 150 minutes per week if tolerated. . Lipid-lowering medications: see documented in medical record.  4. Anxiety and depression Behavior modification techniques were discussed today to help Aimee Gray deal with her anxiety. Aimee Gray agreed to start Trintellix 10 mg PO daily with no refills. Orders and follow up as documented in patient record.   - vortioxetine HBr (TRINTELLIX) 10 MG TABS tablet; Take 1 tablet (10 mg total) by mouth daily.  Dispense: 30 tablet; Refill: 0  5. At risk for diabetes mellitus Aimee Gray was given approximately 30 minutes of diabetes education and counseling  today. We discussed intensive lifestyle modifications today with an emphasis on weight loss as well as increasing exercise and decreasing simple carbohydrates in her diet. We also reviewed medication options with an emphasis on risk versus benefit of those discussed.    Repetitive spaced learning was employed today to elicit superior memory formation and behavioral change.  6. Class 1 obesity with serious comorbidity and body mass index (BMI) of 30.0 to 30.9 in adult, unspecified obesity type Aimee Gray is currently in the action stage of change. As such, her goal is to continue with weight loss efforts. She has agreed to the Category 2 Plan + 100 calories.   Exercise goals: No exercise has been prescribed at this time.  Behavioral modification strategies: increasing lean protein intake, meal planning and cooking strategies, keeping healthy foods in the home and planning for success.  Aimee Gray has agreed to follow-up with our clinic in 2 weeks. She was informed of the importance of frequent follow-up visits to maximize her success with intensive lifestyle modifications for her multiple health conditions.   Objective:   Blood pressure 122/79, pulse 85, temperature 98.1 F (36.7 C), temperature source Oral, height 5' (1.524 m), weight 149 lb (67.6 kg), SpO2 97 %. Body mass index is 29.1 kg/m.  General: Cooperative, alert, well developed, in no acute distress. HEENT: Conjunctivae and lids unremarkable. Cardiovascular: Regular rhythm.  Lungs: Normal work of breathing. Neurologic: No focal deficits.   Lab Results  Component Value Date   CREATININE 0.71 10/28/2019   BUN 7 10/28/2019   NA 139 10/28/2019   K 4.2 10/28/2019   CL 102 10/28/2019   CO2 26 10/28/2019   Lab Results  Component Value Date   ALT 17 10/28/2019   AST 16 10/28/2019   ALKPHOS 67 10/28/2019   BILITOT <0.2 10/28/2019   Lab Results  Component Value Date   HGBA1C 5.2 10/28/2019   Lab Results  Component Value Date   INSULIN 13.9 10/28/2019   Lab Results  Component Value Date   TSH 2.850 10/28/2019   Lab Results  Component Value Date   CHOL 167 10/28/2019   HDL 45 10/28/2019   LDLCALC 106 (H) 10/28/2019   TRIG 84 10/28/2019   CHOLHDL 3.4 04/17/2017   Lab Results   Component Value Date   WBC 7.0 10/28/2019   HGB 14.8 10/28/2019   HCT 43.7 10/28/2019   MCV 92 10/28/2019   PLT 319 10/28/2019   No results found for: IRON, TIBC, FERRITIN  Attestation Statements:   Reviewed by clinician on day of visit: allergies, medications, problem list, medical history, surgical history, family history, social history, and previous encounter notes.   I, Trixie Dredge, am acting as transcriptionist for Coralie Common, MD.  I have reviewed the above documentation for accuracy and completeness, and I agree with the above. - Jinny Blossom, MD

## 2019-11-18 ENCOUNTER — Other Ambulatory Visit: Payer: Self-pay

## 2019-11-18 ENCOUNTER — Telehealth (INDEPENDENT_AMBULATORY_CARE_PROVIDER_SITE_OTHER): Payer: BC Managed Care – PPO | Admitting: Psychology

## 2019-11-18 DIAGNOSIS — F5089 Other specified eating disorder: Secondary | ICD-10-CM | POA: Diagnosis not present

## 2019-11-18 DIAGNOSIS — F419 Anxiety disorder, unspecified: Secondary | ICD-10-CM

## 2019-11-18 NOTE — Progress Notes (Signed)
  Office: [phone]  /  Fax: [phone]    Date: December 02, 2019   Appointment Start Time: 8:05am Duration: 25 minutes Provider: Lawerance Cruel, Psy.D. Type of Session: Individual Therapy  Location of Patient: Home Location of Provider: [facility] Type of Contact: Telepsychological Visit via MyChart Video Visit  Session Content:This provider called Aimee Gray at 8:02am as she did not present for the telepsychological appointment. This provider left a HIPAA compliant voicemail requesting a call back. She was observed joining. As such, today's appointment was initiated 5 minutes late. Aimee Gray is a 49 y.o. female presenting via MyChart Video Visit for a follow-up appointment to address the previously established treatment goal of increasing coping skills. Today's appointment was a telepsychological visit due to COVID-19. Aimee Gray provided verbal consent for today's telepsychological appointment and she is aware she is responsible for securing confidentiality on her end of the session. Prior to proceeding with today's appointment, Aimee Gray's physical location at the time of this appointment was obtained as well a phone number she could be reached at in the event of technical difficulties. Aimee Gray and this provider participated in today's telepsychological service.   This provider conducted a brief check-in. Aimee Gray reported she is "struggling a little bit," adding "the scale stopped moving." This was further explored and she was engaged in problem solving. Additionally, psychoeducation regarding the consequences of weighing regularly and healthy weight loss was provided. She acknowledged she weighs herself five times a day. Aimee Gray was receptive to reducing that to once a day for the first week and then every other day for the following week. Aimee Gray was receptive to today's appointment as evidenced by openness to sharing, responsiveness to feedback, and willingness to implement  discussed strategies .  Mental Status Examination:  Appearance: well groomed and appropriate hygiene  Behavior: appropriate to circumstances Mood: euthymic Affect: mood congruent Speech: normal in rate, volume, and tone Eye Contact: appropriate Psychomotor Activity: appropriate Gait: unable to assess Thought Process: linear, logical, and goal directed  Thought Content/Perception: no hallucinations, delusions, bizarre thinking or behavior reported or observed and no evidence of suicidal and homicidal ideation, plan, and intent Orientation: time, person, place, and purpose of appointment Memory/Concentration: memory, attention, language, and fund of knowledge intact  Insight/Judgment: fair  Interventions:  Conducted a brief chart review Provided empathic reflections and validation Employed supportive psychotherapy interventions to facilitate reduced distress and to improve coping skills with identified stressors Employed motivational interviewing skills to assess patient's willingness/desire to adhere to recommended medical treatments and assignments  Engaged patient in problem solving  DSM-5 Diagnosis(es): 307.59 (F50.8) Other Specified Feeding or Eating Disorder, Emotional Eating Behaviors and 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 Progress is limited, as Aimee Gray has just begun treatment with this provider; however, she is receptive to the interaction and interventions and rapport is being established.   Plan: The next appointment will be scheduled in two weeks, which will be via MyChart Video Visit. The next session will focus on working towards the established treatment goal.

## 2019-11-21 ENCOUNTER — Encounter (INDEPENDENT_AMBULATORY_CARE_PROVIDER_SITE_OTHER): Payer: Self-pay

## 2019-11-26 ENCOUNTER — Ambulatory Visit (INDEPENDENT_AMBULATORY_CARE_PROVIDER_SITE_OTHER): Payer: BC Managed Care – PPO | Admitting: Family Medicine

## 2019-11-26 ENCOUNTER — Encounter (INDEPENDENT_AMBULATORY_CARE_PROVIDER_SITE_OTHER): Payer: Self-pay | Admitting: Family Medicine

## 2019-11-26 ENCOUNTER — Other Ambulatory Visit: Payer: Self-pay

## 2019-11-26 VITALS — BP 113/78 | HR 86 | Temp 98.0°F | Ht 60.0 in | Wt 145.0 lb

## 2019-11-26 DIAGNOSIS — F419 Anxiety disorder, unspecified: Secondary | ICD-10-CM

## 2019-11-26 DIAGNOSIS — E559 Vitamin D deficiency, unspecified: Secondary | ICD-10-CM | POA: Diagnosis not present

## 2019-11-26 DIAGNOSIS — Z6830 Body mass index (BMI) 30.0-30.9, adult: Secondary | ICD-10-CM

## 2019-11-26 DIAGNOSIS — E669 Obesity, unspecified: Secondary | ICD-10-CM

## 2019-11-26 DIAGNOSIS — Z9189 Other specified personal risk factors, not elsewhere classified: Secondary | ICD-10-CM

## 2019-11-26 DIAGNOSIS — F329 Major depressive disorder, single episode, unspecified: Secondary | ICD-10-CM

## 2019-11-26 DIAGNOSIS — F32A Depression, unspecified: Secondary | ICD-10-CM

## 2019-11-26 MED ORDER — VITAMIN D (ERGOCALCIFEROL) 1.25 MG (50000 UNIT) PO CAPS: 50000.0000 [IU] | ORAL_CAPSULE | ORAL | 0 refills | Status: DC

## 2019-11-26 MED ORDER — VORTIOXETINE HBR 10 MG PO TABS: 10.0000 mg | ORAL_TABLET | Freq: Every day | ORAL | 0 refills | Status: DC

## 2019-11-28 NOTE — Progress Notes (Signed)
Chief Complaint:   OBESITY Quin is here to discuss her progress with her obesity treatment plan along with follow-up of her obesity related diagnoses. Taelyr is on the Category 2 Plan + 100 calories and states she is following her eating plan approximately 80% of the time. Yolander states she is walking 10,000 steps 7 times per week.  Today's visit was #: 3 Starting weight: 156 lbs Starting date: 10/28/2019 Today's weight: 145 lbs Today's date: 11/26/2019 Total lbs lost to date: 11 Total lbs lost since last in-office visit: 4  Interim History: Kloey has struggled to get all the food in over the last few weeks. She did have 1 migraine that caused vomiting and severe pain. She notes she feels flat. Breakfast is easy to get in, lunch is harder as she is getting up later. She is starting to feel bored and wants more options.  Subjective:   1. Anxiety and depression Arriana denies suicidal ideas or homicidal ideas. She notes her symptoms are better controlled but she is feeling flat on Trintellix due to significant sexual side effects on SSRIs and SNRIs.  2. Vitamin D deficiency Lauriann denies nausea, vomiting, or muscle weakness, but she notes fatigue. Last Vit D level was 32.5.  3. At risk for osteoporosis Ennifer is at higher risk of osteopenia and osteoporosis due to Vitamin D deficiency.   Assessment/Plan:   1. Anxiety and depression Behavior modification techniques were discussed today to help Tiwana deal with her anxiety. We will refill Trintellix for 1 month. Orders and follow up as documented in patient record.   - vortioxetine HBr (TRINTELLIX) 10 MG TABS tablet; Take 1 tablet (10 mg total) by mouth daily.  Dispense: 30 tablet; Refill: 0  2. Vitamin D deficiency Low Vitamin D level contributes to fatigue and are associated with obesity, breast, and colon cancer. We will refill prescription Vitamin D for 1 month. Kenzleigh will follow-up for routine testing of Vitamin D,  at least 2-3 times per year to avoid over-replacement.  - Vitamin D, Ergocalciferol, (DRISDOL) 1.25 MG (50000 UNIT) CAPS capsule; Take 1 capsule (50,000 Units total) by mouth every 7 (seven) days.  Dispense: 4 capsule; Refill: 0  3. At risk for osteoporosis Yasmyn was given approximately 15 minutes of osteoporosis prevention counseling today. Marthena is at risk for osteopenia and osteoporosis due to her Vitamin D deficiency. She was encouraged to take her Vitamin D and follow her higher calcium diet and increase strengthening exercise to help strengthen her bones and decrease her risk of osteopenia and osteoporosis.  Repetitive spaced learning was employed today to elicit superior memory formation and behavioral change.  4. Class 1 obesity with serious comorbidity and body mass index (BMI) of 30.0 to 30.9 in adult, unspecified obesity type Laron is currently in the action stage of change. As such, her goal is to continue with weight loss efforts. She has agreed to the Category 2 Plan + 100 calories and keeping a food journal and adhering to recommended goals of 400-500 calories and 35+ grams of protein at supper daily.   Exercise goals: As is.  Behavioral modification strategies: increasing lean protein intake, increasing vegetables, meal planning and cooking strategies, keeping healthy foods in the home and planning for success.  Aadhya has agreed to follow-up with our clinic in 2 weeks. She was informed of the importance of frequent follow-up visits to maximize her success with intensive lifestyle modifications for her multiple health conditions.   Objective:   Blood  pressure 113/78, pulse 86, temperature 98 F (36.7 C), temperature source Oral, height 5' (1.524 m), weight 145 lb (65.8 kg), SpO2 97 %. Body mass index is 28.32 kg/m.  General: Cooperative, alert, well developed, in no acute distress. HEENT: Conjunctivae and lids unremarkable. Cardiovascular: Regular rhythm.  Lungs:  Normal work of breathing. Neurologic: No focal deficits.   Lab Results  Component Value Date   CREATININE 0.71 10/28/2019   BUN 7 10/28/2019   NA 139 10/28/2019   K 4.2 10/28/2019   CL 102 10/28/2019   CO2 26 10/28/2019   Lab Results  Component Value Date   ALT 17 10/28/2019   AST 16 10/28/2019   ALKPHOS 67 10/28/2019   BILITOT <0.2 10/28/2019   Lab Results  Component Value Date   HGBA1C 5.2 10/28/2019   Lab Results  Component Value Date   INSULIN 13.9 10/28/2019   Lab Results  Component Value Date   TSH 2.850 10/28/2019   Lab Results  Component Value Date   CHOL 167 10/28/2019   HDL 45 10/28/2019   LDLCALC 106 (H) 10/28/2019   TRIG 84 10/28/2019   CHOLHDL 3.4 04/17/2017   Lab Results  Component Value Date   WBC 7.0 10/28/2019   HGB 14.8 10/28/2019   HCT 43.7 10/28/2019   MCV 92 10/28/2019   PLT 319 10/28/2019   No results found for: IRON, TIBC, FERRITIN  Attestation Statements:   Reviewed by clinician on day of visit: allergies, medications, problem list, medical history, surgical history, family history, social history, and previous encounter notes.   I, Burt Knack, am acting as transcriptionist for Reuben Likes, MD.  I have reviewed the above documentation for accuracy and completeness, and I agree with the above. - Katherina Mires, MD

## 2019-11-29 ENCOUNTER — Ambulatory Visit (INDEPENDENT_AMBULATORY_CARE_PROVIDER_SITE_OTHER): Payer: BC Managed Care – PPO | Admitting: Psychology

## 2019-11-29 DIAGNOSIS — F411 Generalized anxiety disorder: Secondary | ICD-10-CM

## 2019-12-01 ENCOUNTER — Other Ambulatory Visit (INDEPENDENT_AMBULATORY_CARE_PROVIDER_SITE_OTHER): Payer: Self-pay | Admitting: Family Medicine

## 2019-12-01 DIAGNOSIS — E559 Vitamin D deficiency, unspecified: Secondary | ICD-10-CM

## 2019-12-02 ENCOUNTER — Other Ambulatory Visit: Payer: Self-pay

## 2019-12-02 ENCOUNTER — Telehealth (INDEPENDENT_AMBULATORY_CARE_PROVIDER_SITE_OTHER): Payer: BC Managed Care – PPO | Admitting: Psychology

## 2019-12-02 DIAGNOSIS — F419 Anxiety disorder, unspecified: Secondary | ICD-10-CM

## 2019-12-02 DIAGNOSIS — F5089 Other specified eating disorder: Secondary | ICD-10-CM | POA: Diagnosis not present

## 2019-12-02 NOTE — Progress Notes (Unsigned)
Office: [phone]  /  Fax: [phone]    Date: December 16, 2019   Appointment Start Time: *** Duration: *** minutes Provider: Lawerance Cruel, Psy.D. Type of Session: Individual Therapy  Location of Patient: {gbptloc:23249} Location of Provider: {Location of Service:22491} Type of Contact: Telepsychological Visit via MyChart Video Visit  Session Content: Aimee Gray is a 49 y.o. female presenting via MyChart Video Visit for a follow-up appointment to address the previously established treatment goal of increasing coping skills. Today's appointment was a telepsychological visit due to COVID-19. [name] provided verbal consent for today's telepsychological appointment and she is aware she is responsible for securing confidentiality on her end of the session. Prior to proceeding with today's appointment, [name]'s physical location at the time of this appointment was obtained as well a phone number she could be reached at in the event of technical difficulties. [name] and this provider participated in today's telepsychological service.   This provider conducted a brief check-in and verbally administered the PHQ-9 and GAD-7. *** [name] was receptive to today's appointment as evidenced by openness to sharing, responsiveness to feedback, and {gbreceptiveness:23401}.  Mental Status Examination:  Appearance: {Appearance:22431} Behavior: {Behavior:22445} Mood: {gbmood:21757} Affect: {Affect:22436} Speech: {Speech:22432} Eye Contact: {Eye Contact:22433} Psychomotor Activity: {Motor Activity:22434} Gait: {gbgait:23404} Thought Process: {thought process:22448}  Thought Content/Perception: {disturbances:22451} Orientation: {Orientation:22437} Memory/Concentration: {gbcognition:22449} Insight/Judgment: {Insight:22446}  Structured Assessments Results: The Patient Health Questionnaire-9 (PHQ-9) is a self-report measure that assesses symptoms and severity of depression over the course of the last two  weeks. [name] obtained a score of *** suggesting {GBPHQ9SEVERITY:21752}. [name] finds the endorsed symptoms to be {gbphq9difficulty:21754}. [0= Not at all; 1= Several days; 2= More than half the days; 3= Nearly every day] Little interest or pleasure in doing things ***  Feeling down, depressed, or hopeless ***  Trouble falling or staying asleep, or sleeping too much ***  Feeling tired or having little energy ***  Poor appetite or overeating ***  Feeling bad about yourself --- or that you are a failure or have let yourself or your family down ***  Trouble concentrating on things, such as reading the newspaper or watching television ***  Moving or speaking so slowly that other people could have noticed? Or the opposite --- being so fidgety or restless that you have been moving around a lot more than usual ***  Thoughts that you would be better off dead or hurting yourself in some way ***  PHQ-9 Score ***    The Generalized Anxiety Disorder-7 (GAD-7) is a brief self-report measure that assesses symptoms of anxiety over the course of the last two weeks. [name] obtained a score of *** suggesting {gbgad7severity:21753}. [name] finds the endorsed symptoms to be {gbphq9difficulty:21754}. [0= Not at all; 1= Several days; 2= Over half the days; 3= Nearly every day] Feeling nervous, anxious, on edge ***  Not being able to stop or control worrying ***  Worrying too much about different things ***  Trouble relaxing ***  Being so restless that it's hard to sit still ***  Becoming easily annoyed or irritable ***  Feeling afraid as if something awful might happen ***  GAD-7 Score ***   Interventions:  {Interventions for Progress Notes:23405}  DSM-5 Diagnosis(es): 307.59 (F50.8) Other Specified Feeding or Eating Disorder, Emotional Eating Behaviors and 300.00 (F41.9) Unspecified Anxiety Disorder  Treatment Goal & Progress: During the initial appointment with this provider, the following treatment  goal was established: increase coping skills. [name] has demonstrated progress in her goal as evidenced by {gbtxprogress:22839}. [name] also {gbtxprogress2:22951}.  Plan: The next appointment will be scheduled in {gbweeks:21758}, which will be {gbtxmodality:23402}. The next session will focus on {Plan for Next Appointment:23400}.

## 2019-12-03 ENCOUNTER — Encounter (INDEPENDENT_AMBULATORY_CARE_PROVIDER_SITE_OTHER): Payer: Self-pay | Admitting: Family Medicine

## 2019-12-09 ENCOUNTER — Ambulatory Visit (INDEPENDENT_AMBULATORY_CARE_PROVIDER_SITE_OTHER): Payer: BC Managed Care – PPO | Admitting: Psychology

## 2019-12-09 DIAGNOSIS — F411 Generalized anxiety disorder: Secondary | ICD-10-CM

## 2019-12-10 ENCOUNTER — Encounter (INDEPENDENT_AMBULATORY_CARE_PROVIDER_SITE_OTHER): Payer: Self-pay | Admitting: Family Medicine

## 2019-12-10 ENCOUNTER — Other Ambulatory Visit: Payer: Self-pay

## 2019-12-10 ENCOUNTER — Ambulatory Visit (INDEPENDENT_AMBULATORY_CARE_PROVIDER_SITE_OTHER): Payer: BC Managed Care – PPO | Admitting: Family Medicine

## 2019-12-10 VITALS — BP 117/79 | HR 68 | Temp 98.1°F | Ht 60.0 in | Wt 147.0 lb

## 2019-12-10 DIAGNOSIS — E559 Vitamin D deficiency, unspecified: Secondary | ICD-10-CM

## 2019-12-10 DIAGNOSIS — F419 Anxiety disorder, unspecified: Secondary | ICD-10-CM | POA: Diagnosis not present

## 2019-12-10 DIAGNOSIS — F329 Major depressive disorder, single episode, unspecified: Secondary | ICD-10-CM

## 2019-12-10 DIAGNOSIS — Z9189 Other specified personal risk factors, not elsewhere classified: Secondary | ICD-10-CM | POA: Diagnosis not present

## 2019-12-10 DIAGNOSIS — Z6830 Body mass index (BMI) 30.0-30.9, adult: Secondary | ICD-10-CM

## 2019-12-10 DIAGNOSIS — E669 Obesity, unspecified: Secondary | ICD-10-CM

## 2019-12-10 DIAGNOSIS — F32A Depression, unspecified: Secondary | ICD-10-CM

## 2019-12-10 MED ORDER — VORTIOXETINE HBR 10 MG PO TABS: 10.0000 mg | ORAL_TABLET | Freq: Every day | ORAL | 0 refills | Status: DC

## 2019-12-12 NOTE — Progress Notes (Signed)
Chief Complaint:   OBESITY Aimee Gray is here to discuss her progress with her obesity treatment plan along with follow-up of her obesity related diagnoses. Aimee Gray is on the Category 2 Plan + 100 calories and keeping a food journal and adhering to recommended goals of 400-500 calories and 35+ grams of protein at supper daily and states she is following her eating plan approximately 60-70% of the time. Aimee Gray states she is walking 10,000 steps for 10 days.  Today's visit was #: 4 Starting weight: 156 lbs Starting date: 10/28/2019 Today's weight: 147 lbs Today's date: 12/10/2019 Total lbs lost to date: 9 Total lbs lost since last in-office visit: 0  Interim History: Aimee Gray went on a trip for the first time in 3 years, and she struggled to get back on the plan after that trip. She has stopped weighing herself daily and she has continued to experiment with how to prepare 2 meals.  Subjective:   1. Vitamin D deficiency Aimee Gray denies nausea, vomiting, or muscle weakness, but she notes fatigue. She is on prescription Vit D.  2. Anxiety and depression Aimee Gray denies suicidal ideas or homicidal ideas. She started on Trintellix and previously reported feeling flat, but she reports feeling ok today.  3. At risk for osteoporosis Aimee Gray is at higher risk of osteopenia and osteoporosis due to Vitamin D deficiency.   Assessment/Plan:   1. Vitamin D deficiency Low Vitamin D level contributes to fatigue and are associated with obesity, breast, and colon cancer. Aimee Gray agreed to continue taking prescription Vitamin D 50,000 IU every week, no refill needed. She will follow-up for routine testing of Vitamin D, at least 2-3 times per year to avoid over-replacement.  2. Anxiety and depression Behavior modification techniques were discussed today to help Janean deal with her anxiety. We will refill Trintellix for 1 month. Orders and follow up as documented in patient record.   - vortioxetine HBr  (TRINTELLIX) 10 MG TABS tablet; Take 1 tablet (10 mg total) by mouth daily.  Dispense: 30 tablet; Refill: 0  3. At risk for osteoporosis Aimee Gray was given approximately 15 minutes of osteoporosis prevention counseling today. Aimee Gray is at risk for osteopenia and osteoporosis due to her Vitamin D deficiency. She was encouraged to take her Vitamin D and follow her higher calcium diet and increase strengthening exercise to help strengthen her bones and decrease her risk of osteopenia and osteoporosis.  Repetitive spaced learning was employed today to elicit superior memory formation and behavioral change.  4. Class 1 obesity with serious comorbidity and body mass index (BMI) of 30.0 to 30.9 in adult, unspecified obesity type Aimee Gray is currently in the action stage of change. As such, her goal is to continue with weight loss efforts. She has agreed to the Category 2 Plan + 100 calories.   Exercise goals: All adults should avoid inactivity. Some physical activity is better than none, and adults who participate in any amount of physical activity gain some health benefits.  Behavioral modification strategies: increasing lean protein intake, meal planning and cooking strategies, keeping healthy foods in the home and planning for success.  Aimee Gray has agreed to follow-up with our clinic in 2 to 3 weeks. She was informed of the importance of frequent follow-up visits to maximize her success with intensive lifestyle modifications for her multiple health conditions.   Objective:   Blood pressure 117/79, pulse 68, temperature 98.1 F (36.7 C), temperature source Oral, height 5' (1.524 m), weight 147 lb (66.7 kg), SpO2 100 %.  Body mass index is 28.71 kg/m.  General: Cooperative, alert, well developed, in no acute distress. HEENT: Conjunctivae and lids unremarkable. Cardiovascular: Regular rhythm.  Lungs: Normal work of breathing. Neurologic: No focal deficits.   Lab Results  Component Value Date    CREATININE 0.71 10/28/2019   BUN 7 10/28/2019   NA 139 10/28/2019   K 4.2 10/28/2019   CL 102 10/28/2019   CO2 26 10/28/2019   Lab Results  Component Value Date   ALT 17 10/28/2019   AST 16 10/28/2019   ALKPHOS 67 10/28/2019   BILITOT <0.2 10/28/2019   Lab Results  Component Value Date   HGBA1C 5.2 10/28/2019   Lab Results  Component Value Date   INSULIN 13.9 10/28/2019   Lab Results  Component Value Date   TSH 2.850 10/28/2019   Lab Results  Component Value Date   CHOL 167 10/28/2019   HDL 45 10/28/2019   LDLCALC 106 (H) 10/28/2019   TRIG 84 10/28/2019   CHOLHDL 3.4 04/17/2017   Lab Results  Component Value Date   WBC 7.0 10/28/2019   HGB 14.8 10/28/2019   HCT 43.7 10/28/2019   MCV 92 10/28/2019   PLT 319 10/28/2019   No results found for: IRON, TIBC, FERRITIN  Attestation Statements:   Reviewed by clinician on day of visit: allergies, medications, problem list, medical history, surgical history, family history, social history, and previous encounter notes.   I, Burt Knack, am acting as transcriptionist for Reuben Likes, MD.  I have reviewed the above documentation for accuracy and completeness, and I agree with the above. - Katherina Mires, MD

## 2019-12-12 NOTE — Progress Notes (Signed)
  Office: [phone]  /  Fax: [phone]    Date: December 18, 2019   Appointment Start Time: 2:20pm Duration: 31 minutes Provider: Lawerance Cruel, Psy.D. Type of Session: Individual Therapy  Location of Patient: Home Location of Provider: Provider's Home Type of Contact: Telepsychological Visit via MyChart Video Visit  Session Content: Aimee Gray is a 49 y.o. female presenting via MyChart Video Visit for a follow-up appointment to address the previously established treatment goal of increasing coping skills. Today's appointment was a telepsychological visit due to COVID-19. Aimee Gray provided verbal consent for today's telepsychological appointment and she is aware she is responsible for securing confidentiality on her end of the session. Prior to proceeding with today's appointment, Aimee Gray's physical location at the time of this appointment was obtained as well a phone number she could be reached at in the event of technical difficulties. Aimee Gray and this provider participated in today's telepsychological service.   This provider conducted a brief check-in. Aimee Gray shared about recent events, including a camping trip. Regarding eating, Aimee Gray stated, "Travel is hard" due to emotional eating. She explained doing "well" when at home, especially as it relates to protein intake. Aimee Gray reported a significant reduction in weighing herself. Positive reinforcement was provided. Psychoeducation regarding triggers for emotional eating was provided. Aimee Gray was provided a handout, and encouraged to utilize the handout between now and the next appointment to increase awareness of triggers and frequency. Aimee Gray agreed. This provider also discussed behavioral strategies for specific triggers, such as placing the utensil down when conversing to avoid mindless eating. Aimee Gray provided verbal consent during today's appointment for this provider to send a handout about triggers via e-mail. Aimee Gray was receptive to  today's appointment as evidenced by openness to sharing, responsiveness to feedback, and willingness to explore triggers for emotional eating.  Mental Status Examination:  Appearance: well groomed and appropriate hygiene  Behavior: appropriate to circumstances Mood: euthymic Affect: mood congruent Speech: normal in rate, volume, and tone Eye Contact: appropriate Psychomotor Activity: appropriate Gait: unable to assess Thought Process: linear, logical, and goal directed  Thought Content/Perception: no hallucinations, delusions, bizarre thinking or behavior reported or observed and no evidence of suicidal and homicidal ideation, plan, and intent Orientation: time, person, place, and purpose of appointment Memory/Concentration: memory, attention, language, and fund of knowledge intact  Insight/Judgment: fair  Interventions:  Conducted a brief chart review Provided empathic reflections and validation Provided positive reinforcement Employed supportive psychotherapy interventions to facilitate reduced distress and to improve coping skills with identified stressors Psychoeducation provided regarding triggers for emotional eating  DSM-5 Diagnosis(es): 307.59 (F50.8) Other Specified Feeding or Eating Disorder, Emotional Eating Behaviors and 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 Aimee Gray has demonstrated progress in her goal as evidenced by increased awareness of hunger patterns. Aimee Gray also continues to demonstrate willingness to engage in learned skill(s).  Plan: The next appointment will be scheduled in 2-3 weeks, which will be via MyChart Video Visit. The next session will focus on working towards the established treatment goal.

## 2019-12-16 ENCOUNTER — Telehealth (INDEPENDENT_AMBULATORY_CARE_PROVIDER_SITE_OTHER): Payer: Self-pay | Admitting: Psychology

## 2019-12-18 ENCOUNTER — Telehealth (INDEPENDENT_AMBULATORY_CARE_PROVIDER_SITE_OTHER): Payer: BC Managed Care – PPO | Admitting: Psychology

## 2019-12-18 ENCOUNTER — Other Ambulatory Visit: Payer: Self-pay

## 2019-12-18 DIAGNOSIS — F5089 Other specified eating disorder: Secondary | ICD-10-CM | POA: Diagnosis not present

## 2019-12-18 DIAGNOSIS — F419 Anxiety disorder, unspecified: Secondary | ICD-10-CM

## 2019-12-23 ENCOUNTER — Ambulatory Visit: Payer: BC Managed Care – PPO | Admitting: Psychology

## 2019-12-24 NOTE — Progress Notes (Signed)
  Office: [phone]  /  Fax: [phone]    Date: January 07, 2020   Appointment Start Time: 4:00pm Duration: 31 minutes Provider: Lawerance Cruel, Psy.D. Type of Session: Individual Therapy  Location of Patient: Home Location of Provider: [facility] Type of Contact: Telepsychological Visit via MyChart Video Visit  Session Content: Aimee Gray is a 49 y.o. female presenting via MyChart Video Visit for a follow-up appointment to address the previously established treatment goal of increasing coping skills. Today's appointment was a telepsychological visit due to COVID-19. Aimee Gray provided verbal consent for today's telepsychological appointment and she is aware she is responsible for securing confidentiality on her end of the session. Prior to proceeding with today's appointment, Aimee Gray's physical location at the time of this appointment was obtained as well a phone number she could be reached at in the event of technical difficulties. Aimee Gray and this provider participated in today's telepsychological service.   This provider conducted a brief check-in. Aimee Gray shared about recent events, including an increase in responsibilities. She noted an increased ability to cope, adding her Trintellix prescription was recently increased. Triggers for emotional eating were reviewed. This provider also discussed the impact of the dieting mentality on overeating and binge eating. Psychoeducation regarding mindfulness was provided. A handout was provided to [facility] with further information regarding mindfulness, including exercises. This provider also explained the benefit of mindfulness as it relates to emotional eating. Aimee Gray was encouraged to engage in the provided exercises between now and the next appointment with this provider. Aimee Gray agreed. During today's appointment, [name] was led through a mindfulness exercise involving her senses. Aimee Gray provided verbal consent during today's  appointment for this provider to send a handout about mindfulness via e-mail. Aimee Gray was receptive to today's appointment as evidenced by openness to sharing, responsiveness to feedback, and willingness to engage in mindfulness exercises to assist with coping.  Mental Status Examination:  Appearance: well groomed and appropriate hygiene  Behavior: appropriate to circumstances Mood: euthymic Affect: mood congruent Speech: normal in rate, volume, and tone Eye Contact: appropriate Psychomotor Activity: appropriate Gait: unable to assess Thought Process: linear, logical, and goal directed  Thought Content/Perception: no hallucinations, delusions, bizarre thinking or behavior reported or observed and no evidence of suicidal and homicidal ideation, plan, and intent Orientation: time, person, place, and purpose of appointment Memory/Concentration: memory, attention, language, and fund of knowledge intact  Insight/Judgment: fair  Interventions:  Conducted a brief chart review Provided empathic reflections and validation Reviewed content from the previous session Employed supportive psychotherapy interventions to facilitate reduced distress and to improve coping skills with identified stressors Psychoeducation provided regarding mindfulness Engaged patient in mindfulness exercise(s) Employed acceptance and commitment interventions to emphasize mindfulness and acceptance without struggle  DSM-5 Diagnosis(es): 307.59 (F50.8) Other Specified Feeding or Eating Disorder, Emotional Eating Behaviors and 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 Aimee Gray has demonstrated progress in her goal as evidenced by increased awareness of hunger patterns and increased awareness of triggers for emotional eating. Aimee Gray also demonstrates willingness to engage in  mindfulness exercises.  Plan: The next appointment will be scheduled in 2-3 weeks, which will be via MyChart Video Visit. The next session will focus on working towards the established treatment goal.

## 2019-12-25 ENCOUNTER — Other Ambulatory Visit (INDEPENDENT_AMBULATORY_CARE_PROVIDER_SITE_OTHER): Payer: Self-pay | Admitting: Family Medicine

## 2019-12-25 DIAGNOSIS — E559 Vitamin D deficiency, unspecified: Secondary | ICD-10-CM

## 2019-12-31 ENCOUNTER — Other Ambulatory Visit: Payer: Self-pay

## 2019-12-31 ENCOUNTER — Ambulatory Visit (INDEPENDENT_AMBULATORY_CARE_PROVIDER_SITE_OTHER): Payer: BC Managed Care – PPO | Admitting: Family Medicine

## 2019-12-31 ENCOUNTER — Encounter (INDEPENDENT_AMBULATORY_CARE_PROVIDER_SITE_OTHER): Payer: Self-pay | Admitting: Family Medicine

## 2019-12-31 VITALS — BP 147/78 | HR 80 | Temp 97.7°F | Ht 60.0 in | Wt 148.0 lb

## 2019-12-31 DIAGNOSIS — E559 Vitamin D deficiency, unspecified: Secondary | ICD-10-CM | POA: Diagnosis not present

## 2019-12-31 DIAGNOSIS — Z9189 Other specified personal risk factors, not elsewhere classified: Secondary | ICD-10-CM

## 2019-12-31 DIAGNOSIS — F329 Major depressive disorder, single episode, unspecified: Secondary | ICD-10-CM

## 2019-12-31 DIAGNOSIS — E669 Obesity, unspecified: Secondary | ICD-10-CM

## 2019-12-31 DIAGNOSIS — F419 Anxiety disorder, unspecified: Secondary | ICD-10-CM

## 2019-12-31 DIAGNOSIS — Z6830 Body mass index (BMI) 30.0-30.9, adult: Secondary | ICD-10-CM

## 2019-12-31 MED ORDER — VORTIOXETINE HBR 20 MG PO TABS: 20.0000 mg | ORAL_TABLET | Freq: Every day | ORAL | 0 refills | Status: DC

## 2019-12-31 MED ORDER — VITAMIN D (ERGOCALCIFEROL) 1.25 MG (50000 UNIT) PO CAPS: 50000.0000 [IU] | ORAL_CAPSULE | ORAL | 0 refills | Status: DC

## 2020-01-02 NOTE — Progress Notes (Signed)
Chief Complaint:   OBESITY Aimee Gray is here to discuss her progress with her obesity treatment plan along with follow-up of her obesity related diagnoses. Aimee Gray is on the Category 2 Plan + 100 calories and states she is following her eating plan approximately 85% of the time. Aimee Gray states she is walking 1 mile 3-4 times per week.  Today's visit was #: 5 Starting weight: 156 lbs Starting date: 12/28/2019 Today's weight: 148 lbs Today's date: 01/01/2020 Total lbs lost to date: 8 Total lbs lost since last in-office visit: 0  Interim History: Aimee Gray voices that she has been experiencing emotional eating secondary to her beach trip with her husband's family. She, prior to that trip has been working on increasing protein and decreasing carbs. She got back on track upon returning home. She goes back to work in the next few weeks.  Subjective:   1. Vitamin D deficiency Aimee Gray denies nausea, vomiting, or muscle weakness, but she notes fatigue. She is on prescription Vit D.  2. Anxiety and depression Aimee Gray denies suicidal ideas or homicidal ideas. 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  3. At risk for osteoporosis Aimee Gray is at higher risk of osteopenia and osteoporosis due to Vitamin D deficiency.   Assessment/Plan:   1. Vitamin D deficiency Low Vitamin D level contributes to fatigue and are associated with obesity, breast, and colon cancer. We will refill prescription Vitamin D for 1 month. Aimee Gray will follow-up for routine testing of Vitamin D, at least 2-3 times per year to avoid over-replacement.  - Vitamin D, Ergocalciferol, (DRISDOL) 1.25 MG (50000 UNIT) CAPS capsule; Take 1 capsule (50,000 Units total) by mouth every 7 (seven) days.  Dispense: 4 capsule; Refill: 0  2. Anxiety and depression Behavior modification techniques were discussed today to help Aimee Gray deal with her anxiety and depression. Aimee Gray agreed to increase Trintellix to 20 mg  PO daily with no refills. Orders and follow up as documented in patient record.   - vortioxetine HBr (TRINTELLIX) 20 MG TABS tablet; Take 1 tablet (20 mg total) by mouth daily.  Dispense: 30 tablet; Refill: 0  3. At risk for osteoporosis Aimee Gray was given approximately 15 minutes of osteoporosis prevention counseling today. Aimee Gray is at risk for osteopenia and osteoporosis due to her Vitamin D deficiency. She was encouraged to take her Vitamin D and follow her higher calcium diet and increase strengthening exercise to help strengthen her bones and decrease her risk of osteopenia and osteoporosis.  Repetitive spaced learning was employed today to elicit superior memory formation and behavioral change.  4. Class 1 obesity with serious comorbidity and body mass index (BMI) of 30.0 to 30.9 in adult, unspecified obesity type Aimee Gray is currently in the action stage of change. As such, her goal is to continue with weight loss efforts. She has agreed to the Category 2 Plan + 100 calories.   Exercise goals: As is.  Behavioral modification strategies: increasing lean protein intake, increasing vegetables, meal planning and cooking strategies, keeping healthy foods in the home and planning for success.  Aimee Gray has agreed to follow-up with our clinic in 2 weeks. She was informed of the importance of frequent follow-up visits to maximize her success with intensive lifestyle modifications for her multiple health conditions.   Objective:   Blood pressure (!) 147/78, pulse 80, temperature 97.7 F (36.5 C), temperature source Oral, height 5' (1.524 m), weight 148 lb (67.1 kg), SpO2 98 %. Body mass index is 28.9 kg/m.  General: Cooperative, alert, well developed, in no acute distress. HEENT: Conjunctivae and lids unremarkable. Cardiovascular: Regular rhythm.  Lungs: Normal work of breathing. Neurologic: No focal deficits.   Lab Results  Component Value Date   CREATININE 0.71 10/28/2019   BUN 7  10/28/2019   NA 139 10/28/2019   K 4.2 10/28/2019   CL 102 10/28/2019   CO2 26 10/28/2019   Lab Results  Component Value Date   ALT 17 10/28/2019   AST 16 10/28/2019   ALKPHOS 67 10/28/2019   BILITOT <0.2 10/28/2019   Lab Results  Component Value Date   HGBA1C 5.2 10/28/2019   Lab Results  Component Value Date   INSULIN 13.9 10/28/2019   Lab Results  Component Value Date   TSH 2.850 10/28/2019   Lab Results  Component Value Date   CHOL 167 10/28/2019   HDL 45 10/28/2019   LDLCALC 106 (H) 10/28/2019   TRIG 84 10/28/2019   CHOLHDL 3.4 04/17/2017   Lab Results  Component Value Date   WBC 7.0 10/28/2019   HGB 14.8 10/28/2019   HCT 43.7 10/28/2019   MCV 92 10/28/2019   PLT 319 10/28/2019   No results found for: IRON, TIBC, FERRITIN  Attestation Statements:   Reviewed by clinician on day of visit: allergies, medications, problem list, medical history, surgical history, family history, social history, and previous encounter notes.   I, Burt Knack, am acting as transcriptionist for Reuben Likes, MD.  I have reviewed the above documentation for accuracy and completeness, and I agree with the above. - Katherina Mires, MD

## 2020-01-06 ENCOUNTER — Ambulatory Visit (INDEPENDENT_AMBULATORY_CARE_PROVIDER_SITE_OTHER): Payer: BC Managed Care – PPO | Admitting: Psychology

## 2020-01-06 DIAGNOSIS — F411 Generalized anxiety disorder: Secondary | ICD-10-CM | POA: Diagnosis not present

## 2020-01-07 ENCOUNTER — Telehealth (INDEPENDENT_AMBULATORY_CARE_PROVIDER_SITE_OTHER): Payer: BC Managed Care – PPO | Admitting: Psychology

## 2020-01-07 ENCOUNTER — Other Ambulatory Visit: Payer: Self-pay

## 2020-01-07 DIAGNOSIS — F419 Anxiety disorder, unspecified: Secondary | ICD-10-CM

## 2020-01-07 DIAGNOSIS — F5089 Other specified eating disorder: Secondary | ICD-10-CM

## 2020-01-15 NOTE — Progress Notes (Signed)
  Office: [phone]  /  Fax: [phone]    Date: January 29, 2020   Appointment Start Time: 4:30pm Duration: 33 minutes Provider: Lawerance Cruel, Psy.D. Type of Session: Individual Therapy  Location of Patient: Home Location of Provider: Provider's Home Type of Contact: Telepsychological Visit via MyChart Video Visit  Session Content: Aimee Gray is a 49 y.o. female presenting for a follow-up appointment to address the previously established treatment goal of increasing coping skills. Today's appointment was a telepsychological visit due to COVID-19. Aimee Gray provided verbal consent for today's telepsychological appointment and she is aware she is responsible for securing confidentiality on her end of the session. Prior to proceeding with today's appointment, Aimee Gray's physical location at the time of this appointment was obtained as well a phone number she could be reached at in the event of technical difficulties. Aimee Gray and this provider participated in today's telepsychological service.   This provider conducted a brief check-in. Aimee Gray reported, "It's been a rough couple weeks." She shared both her children were recently sick, which has resulted in an increase anxiety. She indicated the aforementioned has also impacted her sleep and appetite. Additionally, she discussed feeling anger toward her father who recently tested positive for COVID. This was processed further by discussing anger as a secondary emotion. Based on the aforementioned, session focused further on mindfulness to assist with coping. Aimee Gray was led through a mindfulness exercise (leaves on a stream) and her experience was processed. She was observed laughing and smiling after the exercise. Aimee Gray provided verbal consent during today's appointment for this provider to send the handout for today's exercise via e-mail. Aimee Gray was receptive to today's appointment as evidenced by openness to sharing, responsiveness to feedback,  and willingness to continue engaging in mindfulness exercises.  Mental Status Examination:  Appearance: well groomed and appropriate hygiene  Behavior: appropriate to circumstances Mood: sad Affect: mood congruent; tearful Speech: normal in rate, volume, and tone Eye Contact: appropriate Psychomotor Activity: appropriate Gait: unable to assess Thought Process: linear, logical, and goal directed  Thought Content/Perception: no hallucinations, delusions, bizarre thinking or behavior reported or observed and no evidence of suicidal and homicidal ideation, plan, and intent Orientation: time, person, place, and purpose of appointment Memory/Concentration: memory, attention, language, and fund of knowledge intact  Insight/Judgment: good   Interventions:  Conducted a brief chart review Provided empathic reflections and validation Employed supportive psychotherapy interventions to facilitate reduced distress and to improve coping skills with identified stressors Engaged patient in mindfulness exercise(s) Employed acceptance and commitment interventions to emphasize mindfulness and acceptance without struggle  DSM-5 Diagnosis(es): 307.59 (F50.8) Other Specified Feeding or Eating Disorder, Emotional Eating Behaviors and 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 Aimee Gray has demonstrated progress in her goal as evidenced by increased awareness of hunger patterns and increased awareness of triggers for emotional eating. Aimee Gray also continues to demonstrate willingness to engage in learned skill(s).  Plan: The next appointment will be scheduled in approximately two weeks, which will be via MyChart Video Visit. The next session will focus on working towards the established treatment goal. Additionally, Aimee Gray reported her next appointment with Ms.  Maisie Fus [facility]) is on Monday.

## 2020-01-20 ENCOUNTER — Encounter: Payer: Self-pay | Admitting: Registered Nurse

## 2020-01-20 ENCOUNTER — Ambulatory Visit: Payer: BC Managed Care – PPO | Admitting: Registered Nurse

## 2020-01-20 ENCOUNTER — Ambulatory Visit: Payer: BC Managed Care – PPO | Admitting: Psychology

## 2020-01-20 ENCOUNTER — Other Ambulatory Visit: Payer: Self-pay

## 2020-01-20 VITALS — BP 120/82 | HR 88 | Temp 98.5°F | Ht 60.0 in | Wt 146.2 lb

## 2020-01-20 DIAGNOSIS — G43809 Other migraine, not intractable, without status migrainosus: Secondary | ICD-10-CM | POA: Diagnosis not present

## 2020-01-20 NOTE — Patient Instructions (Signed)
° ° ° °  If you have lab work done today you will be contacted with your lab results within the next 2 weeks.  If you have not heard from us then please contact us. The fastest way to get your results is to register for My Chart. ° ° °IF you received an x-ray today, you will receive an invoice from [facility]. Please contact [facility] at [phone] with questions or concerns regarding your invoice.  ° °IF you received labwork today, you will receive an invoice from [facility]. Please contact [facility] at [phone] with questions or concerns regarding your invoice.  ° °Our billing staff will not be able to assist you with questions regarding bills from these companies. ° °You will be contacted with the lab results as soon as they are available. The fastest way to get your results is to activate your My Chart account. Instructions are located on the last page of this paperwork. If you have not heard from us regarding the results in 2 weeks, please contact this office. °  ° ° ° °

## 2020-01-21 ENCOUNTER — Other Ambulatory Visit: Payer: Self-pay

## 2020-01-21 ENCOUNTER — Telehealth: Payer: Self-pay | Admitting: Registered Nurse

## 2020-01-21 ENCOUNTER — Other Ambulatory Visit: Payer: Self-pay | Admitting: Family Medicine

## 2020-01-21 ENCOUNTER — Encounter (INDEPENDENT_AMBULATORY_CARE_PROVIDER_SITE_OTHER): Payer: Self-pay | Admitting: Bariatrics

## 2020-01-21 ENCOUNTER — Ambulatory Visit (INDEPENDENT_AMBULATORY_CARE_PROVIDER_SITE_OTHER): Payer: BC Managed Care – PPO | Admitting: Bariatrics

## 2020-01-21 VITALS — BP 124/89 | HR 78 | Temp 98.2°F | Ht 60.0 in | Wt 144.0 lb

## 2020-01-21 DIAGNOSIS — F3289 Other specified depressive episodes: Secondary | ICD-10-CM

## 2020-01-21 DIAGNOSIS — E559 Vitamin D deficiency, unspecified: Secondary | ICD-10-CM

## 2020-01-21 DIAGNOSIS — Z9189 Other specified personal risk factors, not elsewhere classified: Secondary | ICD-10-CM

## 2020-01-21 DIAGNOSIS — E669 Obesity, unspecified: Secondary | ICD-10-CM

## 2020-01-21 DIAGNOSIS — Z6830 Body mass index (BMI) 30.0-30.9, adult: Secondary | ICD-10-CM

## 2020-01-21 DIAGNOSIS — G43101 Migraine with aura, not intractable, with status migrainosus: Secondary | ICD-10-CM

## 2020-01-21 DIAGNOSIS — E6609 Other obesity due to excess calories: Secondary | ICD-10-CM

## 2020-01-21 MED ORDER — RIZATRIPTAN BENZOATE 10 MG PO TBDP: ORAL_TABLET | ORAL | 6 refills | Status: DC

## 2020-01-21 MED ORDER — VORTIOXETINE HBR 20 MG PO TABS: 20.0000 mg | ORAL_TABLET | Freq: Every day | ORAL | 0 refills | Status: DC

## 2020-01-21 MED ORDER — VITAMIN D (ERGOCALCIFEROL) 1.25 MG (50000 UNIT) PO CAPS: 50000.0000 [IU] | ORAL_CAPSULE | ORAL | 0 refills | Status: DC

## 2020-01-21 NOTE — Telephone Encounter (Signed)
PT need a refill rizatriptan ([name]-MLT) 10 MG disintegrating tablet [[id number]]  ondansetron (ZOFRAN) 4 MG tablet [[id number]] [id number] [place], [place] - [address]  [address] [place] [address]  Phone: [phone] Fax: [phone]

## 2020-01-21 NOTE — Telephone Encounter (Signed)
Pt was seen yesterday  By Jari Sportsman and is looking for Zofran and the below   What is the name of the medication? rizatriptan ([name]-MLT) 10 MG disintegrating tablet  Looks like precritptions didn't get in    Have you contacted your pharmacy to request a refill? y  Which pharmacy would you like this sent to? [id number] [place], [place] - [address]  [address] [place] [address]  Phone:  [phone] Fax:  [phone]    Patient notified that their request is being sent to the clinical staff for review and that they should receive a call once it is complete. If they do not receive a call within 72 hours they can check with their pharmacy or our office.

## 2020-01-21 NOTE — Telephone Encounter (Signed)
Medication request was sent already for the patient.

## 2020-01-21 NOTE — Addendum Note (Signed)
Addended by: Carlean Purl on: 01/21/2020 12:20 PM   Modules accepted: Orders

## 2020-01-21 NOTE — Telephone Encounter (Signed)
Patient also states that Zofran was discussed.  Patient is requesting a refill of the following medications: Requested Prescriptions   Pending Prescriptions Disp Refills  . rizatriptan ([name]-MLT) 10 MG disintegrating tablet 10 tablet 6    Sig: DISSOLVE 1 TAB BY MOUTH AS NEEDED FOR MIGRAINE. MAY REPEAT IN 2 HOURS IF NEEDED. NEEDS OFFICE VISIT    Date of patient request: 01/21/2020 Last office visit: 01/20/2020 Date of last refill: 03/06/2019 Last refill amount: 10 tablets 6 refills Follow up time period per chart:N/A

## 2020-01-22 ENCOUNTER — Encounter: Payer: Self-pay | Admitting: Registered Nurse

## 2020-01-22 DIAGNOSIS — Z6830 Body mass index (BMI) 30.0-30.9, adult: Secondary | ICD-10-CM | POA: Insufficient documentation

## 2020-01-22 DIAGNOSIS — E66811 Obesity, class 1: Secondary | ICD-10-CM | POA: Insufficient documentation

## 2020-01-22 DIAGNOSIS — E669 Obesity, unspecified: Secondary | ICD-10-CM | POA: Insufficient documentation

## 2020-01-22 NOTE — Progress Notes (Signed)
Chief Complaint:   OBESITY Aimee Gray is here to discuss her progress with her obesity treatment plan along with follow-up of her obesity related diagnoses. Aimee Gray is on the Category 2 Plan + 100 calories and states she is following her eating plan approximately 80% of the time. Aimee Gray states she is walking 6,000 steps 5 times per week.  Today's visit was #: 6 Starting weight: 156 lbs Starting date: 10/28/2019 Today's weight: 144 lbs Today's date: 01/21/2020 Total lbs lost to date: 12 Total lbs lost since last in-office visit: 4  Interim History: Aimee Gray is down 4 lbs and has done well overall. She has been under more stress over the last few weeks. Her cravings have been worse due to not sleeping well.  Subjective:   Other depression, with emotional eating. Aimee Gray is struggling with emotional eating and using food for comfort to the extent that it is negatively impacting her health. She has been working on behavior modification techniques to help reduce her emotional eating and has been somewhat successful. She shows no sign of suicidal or homicidal ideations. Aimee Gray is working with Dr. Dewaine Conger.  Vitamin D deficiency. No nausea, vomiting, or muscle weakness.    Ref. Range 10/28/2019 11:46  Vitamin D, 25-Hydroxy Latest Ref Range: 30.0 - 100.0 ng/mL 32.5   Migraine with aura and with status migrainosus, not intractable. Aimee Gray is taking [name] and Zofran.  At risk for activity intolerance. Aimee Gray is at risk of exercise intolerance due to migraines and obesity.  Assessment/Plan:   Other depression, with emotional eating. Behavior modification techniques were discussed today to help Aimee Gray deal with her emotional/non-hunger eating behaviors.  Orders and follow up as documented in patient record. Prescription was given for vortioxetine HBr (TRINTELLIX) 20 MG TABS tablet 1 PO daily #30 with 0 refills.  Vitamin D deficiency. Low Vitamin D level contributes to fatigue and  are associated with obesity, breast, and colon cancer. She was given a prescription for Vitamin D, Ergocalciferol, (DRISDOL) 1.25 MG (50000 UNIT) CAPS capsule every week #4 with 0 refills and will follow-up for routine testing of Vitamin D, at least 2-3 times per year to avoid over-replacement.   Migraine with aura and with status migrainosus, not intractable. Aimee Gray will continue her medications as directed and follow-up with her PCP as scheduled.  At risk for activity intolerance. Aimee Gray was given approximately 15 minutes of exercise intolerance counseling today. She is 49 y.o. female and has risk factors exercise intolerance including obesity. We discussed intensive lifestyle modifications today with an emphasis on specific weight loss instructions and strategies. Aimee Gray will slowly increase activity as tolerated.  Repetitive spaced learning was employed today to elicit superior memory formation and behavioral change.  Class 1 obesity with serious comorbidity and body mass index (BMI) of 30.0 to 30.9 in adult, unspecified obesity type.  Aimee Gray is currently in the action stage of change. As such, her goal is to continue with weight loss efforts. She has agreed to the Category 2 Plan + 100 calories.  She will work on meal planning, intentional eating, and increasing her water and protein intake.    Exercise goals: All adults should avoid inactivity. Some physical activity is better than none, and adults who participate in any amount of physical activity gain some health benefits.  Behavioral modification strategies: increasing lean protein intake, decreasing simple carbohydrates, increasing vegetables, increasing water intake, decreasing eating out, no skipping meals, meal planning and cooking strategies, keeping healthy foods in the home and  planning for success.  Aimee Gray has agreed to follow-up with our clinic in 2 weeks. She was informed of the importance of frequent follow-up visits to  maximize her success with intensive lifestyle modifications for her multiple health conditions.   Objective:   Blood pressure 124/89, pulse 78, temperature 98.2 F (36.8 C), height 5' (1.524 m), weight 144 lb (65.3 kg), SpO2 95 %. Body mass index is 28.12 kg/m.  General: Cooperative, alert, well developed, in no acute distress. HEENT: Conjunctivae and lids unremarkable. Cardiovascular: Regular rhythm.  Lungs: Normal work of breathing. Neurologic: No focal deficits.   Lab Results  Component Value Date   CREATININE 0.71 10/28/2019   BUN 7 10/28/2019   NA 139 10/28/2019   K 4.2 10/28/2019   CL 102 10/28/2019   CO2 26 10/28/2019   Lab Results  Component Value Date   ALT 17 10/28/2019   AST 16 10/28/2019   ALKPHOS 67 10/28/2019   BILITOT <0.2 10/28/2019   Lab Results  Component Value Date   HGBA1C 5.2 10/28/2019   Lab Results  Component Value Date   INSULIN 13.9 10/28/2019   Lab Results  Component Value Date   TSH 2.850 10/28/2019   Lab Results  Component Value Date   CHOL 167 10/28/2019   HDL 45 10/28/2019   LDLCALC 106 (H) 10/28/2019   TRIG 84 10/28/2019   CHOLHDL 3.4 04/17/2017   Lab Results  Component Value Date   WBC 7.0 10/28/2019   HGB 14.8 10/28/2019   HCT 43.7 10/28/2019   MCV 92 10/28/2019   PLT 319 10/28/2019   No results found for: IRON, TIBC, FERRITIN  Attestation Statements:   Reviewed by clinician on day of visit: allergies, medications, problem list, medical history, surgical history, family history, social history, and previous encounter notes.  Fernanda Drum, am acting as Energy manager for [employer], DO   I have reviewed the above documentation for accuracy and completeness, and I agree with the above. Corinna Capra, DO

## 2020-01-24 ENCOUNTER — Other Ambulatory Visit (INDEPENDENT_AMBULATORY_CARE_PROVIDER_SITE_OTHER): Payer: Self-pay | Admitting: Family Medicine

## 2020-01-24 DIAGNOSIS — E559 Vitamin D deficiency, unspecified: Secondary | ICD-10-CM

## 2020-01-28 ENCOUNTER — Encounter: Payer: Self-pay | Admitting: Registered Nurse

## 2020-01-28 ENCOUNTER — Other Ambulatory Visit: Payer: Self-pay | Admitting: Registered Nurse

## 2020-01-28 DIAGNOSIS — G43101 Migraine with aura, not intractable, with status migrainosus: Secondary | ICD-10-CM

## 2020-01-28 MED ORDER — ONDANSETRON HCL 4 MG PO TABS: 4.0000 mg | ORAL_TABLET | Freq: Three times a day (TID) | ORAL | 0 refills | Status: DC | PRN

## 2020-01-28 MED ORDER — RIZATRIPTAN BENZOATE 10 MG PO TBDP: ORAL_TABLET | ORAL | 6 refills | Status: DC

## 2020-01-28 NOTE — Telephone Encounter (Signed)
Pt requesting Zofran states discussed this at her appt with you is this appropriate to fill?

## 2020-01-29 ENCOUNTER — Telehealth (INDEPENDENT_AMBULATORY_CARE_PROVIDER_SITE_OTHER): Payer: BC Managed Care – PPO | Admitting: Psychology

## 2020-01-29 DIAGNOSIS — F5089 Other specified eating disorder: Secondary | ICD-10-CM | POA: Diagnosis not present

## 2020-01-29 DIAGNOSIS — F419 Anxiety disorder, unspecified: Secondary | ICD-10-CM

## 2020-01-30 NOTE — Progress Notes (Signed)
  Office: [phone]  /  Fax: [phone]    Date: February 13, 2020   Appointment Start Time: 3:58pm Duration: 31 minutes Provider: Lawerance Cruel, Psy.D. Type of Session: Individual Therapy  Location of Patient: Home Location of Provider: Provider's Home Type of Contact: Telepsychological Visit via MyChart Video Visit  Session Content: Lindamarie is a 49 y.o. female presenting for a follow-up appointment to address the previously established treatment goal of increasing coping skills. Today's appointment was a telepsychological visit due to COVID-19. [name] provided verbal consent for today's telepsychological appointment and she is aware she is responsible for securing confidentiality on her end of the session. Prior to proceeding with today's appointment, Anndee's physical location at the time of this appointment was obtained as well a phone number she could be reached at in the event of technical difficulties. [name] and this provider participated in today's telepsychological service.   This provider conducted a brief check-in. Timisha shared her father is still sick and now her brother is sick. She shared she continues to focus on balancing responsibilities, including focusing on her eating habits. Reviewed dieting mentality. Additionally, [name] discussed sleeping challenges. As such, session focused further on mindfulness to assist with overall coping. She discussed engaging in the previously shared exercises, which she described as helpful. Kadeidra was led through a mindfulness breathing exercise and her experience was processed. [name] provided verbal consent during today's appointment for this provider to send the handout for today's exercise via e-mail. This provider discussed the utilization of YouTube for mindfulness exercises (e.g., exercises by Rhae Hammock). She was encouraged to try engaging in a mindfulness exercise before bed; she agreed. Furthermore, termination planning was  discussed. Pretty was receptive to a follow-up appointment in 3-4 weeks and an additional follow-up/termination appointment in 3-4 weeks after that. Dafne was receptive to today's appointment as evidenced by openness to sharing, responsiveness to feedback, and willingness to continue engaging in mindfulness exercises.  Mental Status Examination:  Appearance: well groomed and appropriate hygiene  Behavior: appropriate to circumstances Mood: euthymic Affect: mood congruent Speech: normal in rate, volume, and tone Eye Contact: appropriate Psychomotor Activity: appropriate Gait: unable to assess Thought Process: linear, logical, and goal directed  Thought Content/Perception: no hallucinations, delusions, bizarre thinking or behavior reported or observed and no evidence of suicidal and homicidal ideation, plan, and intent Orientation: time, person, place, and purpose of appointment Memory/Concentration: memory, attention, language, and fund of knowledge intact  Insight/Judgment: good   Interventions:  Conducted a brief chart review Provided empathic reflections and validation Reviewed content from the previous session Employed supportive psychotherapy interventions to facilitate reduced distress and to improve coping skills with identified stressors Engaged patient in mindfulness exercise(s) Employed acceptance and commitment interventions to emphasize mindfulness and acceptance without struggle Discussed termination planning  DSM-5 Diagnosis(es): 307.59 (F50.8) Other Specified Feeding or Eating Disorder, Emotional Eating Behaviors and 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 Lakaya has demonstrated progress in her goal as evidenced by increased awareness of hunger patterns and increased awareness of triggers for emotional eating. Nyrie  also continues to demonstrate willingness to engage in learned skill(s).  Plan: The next appointment will be scheduled in 3-4 weeks, which will be via MyChart Video Visit. The next session will focus on working towards the established treatment goal.

## 2020-02-03 ENCOUNTER — Ambulatory Visit (INDEPENDENT_AMBULATORY_CARE_PROVIDER_SITE_OTHER): Payer: BC Managed Care – PPO | Admitting: Psychology

## 2020-02-03 DIAGNOSIS — F411 Generalized anxiety disorder: Secondary | ICD-10-CM | POA: Diagnosis not present

## 2020-02-06 ENCOUNTER — Other Ambulatory Visit: Payer: Self-pay

## 2020-02-06 ENCOUNTER — Encounter (INDEPENDENT_AMBULATORY_CARE_PROVIDER_SITE_OTHER): Payer: Self-pay | Admitting: Family Medicine

## 2020-02-06 ENCOUNTER — Ambulatory Visit (INDEPENDENT_AMBULATORY_CARE_PROVIDER_SITE_OTHER): Payer: BC Managed Care – PPO | Admitting: Family Medicine

## 2020-02-06 VITALS — BP 125/81 | HR 79 | Temp 98.2°F | Ht 60.0 in | Wt 143.0 lb

## 2020-02-06 DIAGNOSIS — E669 Obesity, unspecified: Secondary | ICD-10-CM | POA: Diagnosis not present

## 2020-02-06 DIAGNOSIS — E7849 Other hyperlipidemia: Secondary | ICD-10-CM | POA: Diagnosis not present

## 2020-02-06 DIAGNOSIS — F329 Major depressive disorder, single episode, unspecified: Secondary | ICD-10-CM | POA: Diagnosis not present

## 2020-02-06 DIAGNOSIS — F419 Anxiety disorder, unspecified: Secondary | ICD-10-CM | POA: Diagnosis not present

## 2020-02-06 DIAGNOSIS — Z6830 Body mass index (BMI) 30.0-30.9, adult: Secondary | ICD-10-CM

## 2020-02-06 DIAGNOSIS — F32A Depression, unspecified: Secondary | ICD-10-CM

## 2020-02-10 NOTE — Progress Notes (Signed)
Chief Complaint:   OBESITY Aimee Gray is here to discuss her progress with her obesity treatment plan along with follow-up of her obesity related diagnoses. Aimee Gray is on the Category 2 Plan + 100 calories and states she is following her eating plan approximately 85% of the time. 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  Today's visit was #: 7 Starting weight: 156 lbs Starting date: 10/28/2019 Today's weight: 143 lbs Today's date: 02/06/2020 Total lbs lost to date: 13 Total lbs lost since last in-office visit: 1  Interim History: Aimee Gray is in a mentally stressed place as her father and her brother are both sick with COVID. She is not sleeping well. She has been able to follow thw plan most of the time. Her son is playing baseball and this has caused her to recommit to the plan and prep. She is doing some emotional eating in the evening and night. She has a couple of bowls of ice cream.  Subjective:   1. Other hyperlipidemia Aimee Gray's last LDL was 106, HDL 45, and triglycerides 84, and she is not on statin.  2. Anxiety and depression Aimee Gray denies suicidal ideas or homicidal ideas. She is on Trintellix 20 mg. Per the patient she is better symptomatically controlled but still struggling.  Assessment/Plan:   1. Other hyperlipidemia Cardiovascular risk and specific lipid/LDL goals reviewed. We discussed several lifestyle modifications today. Aimee Gray will continue to work on diet, exercise and weight loss efforts. We will repeat labs in early November. Orders and follow up as documented in patient record.   Counseling Intensive lifestyle modifications are the first line treatment for this issue. . Dietary changes: Increase soluble fiber. Decrease simple carbohydrates. . Exercise changes: Moderate to vigorous-intensity aerobic activity 150 minutes per week if tolerated. . Lipid-lowering medications: see documented in medical record.  2. Anxiety and  depression Behavior modification techniques were discussed today to help Aimee Gray deal with her anxiety and depression. Aimee Gray will continue Trintellix 20 mg PO daily. Orders and follow up as documented in patient record.   3. Class 1 obesity with serious comorbidity and body mass index (BMI) of 30.0 to 30.9 in adult, unspecified obesity type Aimee Gray is currently in the action stage of change. As such, her goal is to continue with weight loss efforts. She has agreed to the Category 2 Plan + 100 calories.   Exercise goals: All adults should avoid inactivity. Some physical activity is better than none, and adults who participate in any amount of physical activity gain some health benefits.  Behavioral modification strategies: increasing lean protein intake, meal planning and cooking strategies, keeping healthy foods in the home and planning for success.  Aimee Gray has agreed to follow-up with our clinic in 2 weeks. She was informed of the importance of frequent follow-up visits to maximize her success with intensive lifestyle modifications for her multiple health conditions.   Objective:   Blood pressure 125/81, pulse 79, temperature 98.2 F (36.8 C), temperature source Oral, height 5' (1.524 m), weight 143 lb (64.9 kg), SpO2 98 %. Body mass index is 27.93 kg/m.  General: Cooperative, alert, well developed, in no acute distress. HEENT: Conjunctivae and lids unremarkable. Cardiovascular: Regular rhythm.  Lungs: Normal work of breathing. Neurologic: No focal deficits.   Lab Results  Component Value Date   CREATININE 0.71 10/28/2019   BUN 7 10/28/2019   NA 139 10/28/2019   K 4.2 10/28/2019   CL 102 10/28/2019   CO2 26 10/28/2019  Lab Results  Component Value Date   ALT 17 10/28/2019   AST 16 10/28/2019   ALKPHOS 67 10/28/2019   BILITOT <0.2 10/28/2019   Lab Results  Component Value Date   HGBA1C 5.2 10/28/2019   Lab Results  Component Value Date   INSULIN 13.9 10/28/2019    Lab Results  Component Value Date   TSH 2.850 10/28/2019   Lab Results  Component Value Date   CHOL 167 10/28/2019   HDL 45 10/28/2019   LDLCALC 106 (H) 10/28/2019   TRIG 84 10/28/2019   CHOLHDL 3.4 04/17/2017   Lab Results  Component Value Date   WBC 7.0 10/28/2019   HGB 14.8 10/28/2019   HCT 43.7 10/28/2019   MCV 92 10/28/2019   PLT 319 10/28/2019   No results found for: IRON, TIBC, FERRITIN  Attestation Statements:   Reviewed by clinician on day of visit: allergies, medications, problem list, medical history, surgical history, family history, social history, and previous encounter notes.  Time spent on visit including pre-visit chart review and post-visit care and charting was 18 minutes.    I, Burt Knack, am acting as transcriptionist for Reuben Likes, MD.  I have reviewed the above documentation for accuracy and completeness, and I agree with the above. - Katherina Mires, MD

## 2020-02-11 ENCOUNTER — Other Ambulatory Visit (INDEPENDENT_AMBULATORY_CARE_PROVIDER_SITE_OTHER): Payer: Self-pay | Admitting: Bariatrics

## 2020-02-11 DIAGNOSIS — E559 Vitamin D deficiency, unspecified: Secondary | ICD-10-CM

## 2020-02-13 ENCOUNTER — Telehealth (INDEPENDENT_AMBULATORY_CARE_PROVIDER_SITE_OTHER): Payer: BC Managed Care – PPO | Admitting: Psychology

## 2020-02-13 DIAGNOSIS — F419 Anxiety disorder, unspecified: Secondary | ICD-10-CM

## 2020-02-13 DIAGNOSIS — F5089 Other specified eating disorder: Secondary | ICD-10-CM

## 2020-02-17 ENCOUNTER — Ambulatory Visit (INDEPENDENT_AMBULATORY_CARE_PROVIDER_SITE_OTHER): Payer: BC Managed Care – PPO | Admitting: Psychology

## 2020-02-17 DIAGNOSIS — F411 Generalized anxiety disorder: Secondary | ICD-10-CM | POA: Diagnosis not present

## 2020-02-19 ENCOUNTER — Other Ambulatory Visit: Payer: Self-pay

## 2020-02-19 ENCOUNTER — Encounter (INDEPENDENT_AMBULATORY_CARE_PROVIDER_SITE_OTHER): Payer: Self-pay | Admitting: Family Medicine

## 2020-02-19 ENCOUNTER — Telehealth (INDEPENDENT_AMBULATORY_CARE_PROVIDER_SITE_OTHER): Payer: BC Managed Care – PPO | Admitting: Family Medicine

## 2020-02-19 ENCOUNTER — Telehealth (INDEPENDENT_AMBULATORY_CARE_PROVIDER_SITE_OTHER): Payer: Self-pay

## 2020-02-19 DIAGNOSIS — E669 Obesity, unspecified: Secondary | ICD-10-CM | POA: Diagnosis not present

## 2020-02-19 DIAGNOSIS — Z6830 Body mass index (BMI) 30.0-30.9, adult: Secondary | ICD-10-CM | POA: Diagnosis not present

## 2020-02-19 DIAGNOSIS — E66811 Obesity, class 1: Secondary | ICD-10-CM

## 2020-02-19 DIAGNOSIS — F3289 Other specified depressive episodes: Secondary | ICD-10-CM

## 2020-02-19 DIAGNOSIS — E559 Vitamin D deficiency, unspecified: Secondary | ICD-10-CM

## 2020-02-19 MED ORDER — VORTIOXETINE HBR 20 MG PO TABS: 20.0000 mg | ORAL_TABLET | Freq: Every day | ORAL | 0 refills | Status: DC

## 2020-02-19 MED ORDER — VITAMIN D (ERGOCALCIFEROL) 1.25 MG (50000 UNIT) PO CAPS: 50000.0000 [IU] | ORAL_CAPSULE | ORAL | 0 refills | Status: DC

## 2020-02-19 NOTE — Telephone Encounter (Signed)
I connected with  Aimee Gray on 02/19/20 by a video enabled telemedicine application and verified that I am speaking with the correct person using two identifiers.   I discussed the limitations of evaluation and management by telemedicine. The patient expressed understanding and agreed to proceed.

## 2020-02-20 NOTE — Progress Notes (Signed)
TeleHealth Visit:  Due to the COVID-19 pandemic, this visit was completed with telemedicine (audio/video) technology to reduce patient and provider exposure as well as to preserve personal protective equipment.   Aimee Gray has verbally consented to this TeleHealth visit. The patient is located at home, the provider is located at the [facility] office. The participants in this visit include the listed provider and patient. The visit was conducted today via MyChart video.  Chief Complaint: OBESITY Aimee Gray is here to discuss her progress with her obesity treatment plan along with follow-up of her obesity related diagnoses. Aimee Gray is on the Category 2 Plan +100 calories and states she is following her eating plan approximately 90% of the time. Aimee Gray states she is getting 8,000-9,000 steps 5 times per week.  Today's visit was #: 8 Starting weight: 156 lbs Starting date: 10/28/2019  Interim History: Aimee Gray is currently at home with significant diarrhea that she thinks is gastroenteritis.  No vomiting, fevers, abdominal pain.  She is trying hard to drink a significant amount of water.  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  Follows plan fairly consistently.  Realized she had not packed enough food for school as she always has plans to eat when getting home, which almost never happens.  She is fairly busy with baseball for her kids and board meetings.  Subjective:   1. Vitamin D deficiency Aimee Gray's Vitamin D level was 32.5 on 10/28/2019. She is currently taking prescription vitamin D 50,000 IU each week. She denies nausea, vomiting or muscle weakness.  She endorses fatigue.  2. Other depression, with emotional eating No SI/HI.  Taking Trintellix with no side effects.  Assessment/Plan:   1. Vitamin D deficiency Low Vitamin D level contributes to fatigue and are associated with obesity, breast, and colon cancer. She agrees to  continue to take prescription Vitamin D @50 ,000 IU every week and will follow-up for routine testing of Vitamin D, at least 2-3 times per year to avoid over-replacement.  -Refill Vitamin D, Ergocalciferol, (DRISDOL) 1.25 MG (50000 UNIT) CAPS capsule; Take 1 capsule (50,000 Units total) by mouth every 7 (seven) days.  Dispense: 4 capsule; Refill: 0  2. Other depression, with emotional eating Continue Trintellix with no change in dose.  -Refill vortioxetine HBr (TRINTELLIX) 20 MG TABS tablet; Take 1 tablet (20 mg total) by mouth daily.  Dispense: 30 tablet; Refill: 0  3. Class 1 obesity with serious comorbidity and body mass index (BMI) of 30.0 to 30.9 in adult, unspecified obesity type  Aimee Gray is currently in the action stage of change. As such, her goal is to continue with weight loss efforts. She has agreed to the Category 2 Plan +100 calories.   Exercise goals: As is.  Behavioral modification strategies: increasing lean protein intake, meal planning and cooking strategies, keeping healthy foods in the home and planning for success.  Aimee Gray has agreed to follow-up with our clinic in 2 weeks. She was informed of the importance of frequent follow-up visits to maximize her success with intensive lifestyle modifications for her multiple health conditions.  Objective:   VITALS: Per patient if applicable, see vitals. GENERAL: Alert and in no acute distress. CARDIOPULMONARY: No increased WOB. Speaking in clear sentences.  PSYCH: Pleasant and cooperative. Speech normal rate and rhythm. Affect is appropriate. Insight and judgement are appropriate. Attention is focused, linear, and appropriate.  NEURO: Oriented as arrived to appointment on time with no prompting.   Lab  Results  Component Value Date   CREATININE 0.71 10/28/2019   BUN 7 10/28/2019   NA 139 10/28/2019   K 4.2 10/28/2019   CL 102 10/28/2019   CO2 26 10/28/2019   Lab Results  Component Value Date   ALT 17 10/28/2019   AST  16 10/28/2019   ALKPHOS 67 10/28/2019   BILITOT <0.2 10/28/2019   Lab Results  Component Value Date   HGBA1C 5.2 10/28/2019   Lab Results  Component Value Date   INSULIN 13.9 10/28/2019   Lab Results  Component Value Date   TSH 2.850 10/28/2019   Lab Results  Component Value Date   CHOL 167 10/28/2019   HDL 45 10/28/2019   LDLCALC 106 (H) 10/28/2019   TRIG 84 10/28/2019   CHOLHDL 3.4 04/17/2017   Lab Results  Component Value Date   WBC 7.0 10/28/2019   HGB 14.8 10/28/2019   HCT 43.7 10/28/2019   MCV 92 10/28/2019   PLT 319 10/28/2019   Attestation Statements:   Reviewed by clinician on day of visit: allergies, medications, problem list, medical history, surgical history, family history, social history, and previous encounter notes.  Time spent on visit including pre-visit chart review and post-visit charting and care was 20 minutes.   I, Insurance claims handler, CMA, am acting as transcriptionist for Reuben Likes, MD.  I have reviewed the above documentation for accuracy and completeness, and I agree with the above. - Katherina Mires, MD

## 2020-02-20 NOTE — Progress Notes (Signed)
  Office: [phone]  /  Fax: [phone]    Date: March 05, 2020   Appointment Start Time: 12:00pm Duration: 32 minutes Provider: Lawerance Cruel, Psy.D. Type of Session: Individual Therapy  Location of Patient: Home Location of Provider: Provider's Home Type of Contact: Telepsychological Visit via MyChart Video Visit  Session Content: Aimee Gray is a 49 y.o. female presenting for a follow-up appointment to address the previously established treatment goal of increasing coping skills. Today's appointment was a telepsychological visit due to COVID-19. Aimee Gray provided verbal consent for today's telepsychological appointment and she is aware she is responsible for securing confidentiality on her end of the session. Prior to proceeding with today's appointment, Aimee Gray's physical location at the time of this appointment was obtained as well a phone number she could be reached at in the event of technical difficulties. Aimee Gray and this provider participated in today's telepsychological service.   This provider conducted a brief check-in. Aimee Gray stated she was at home sick secondary to a migraine. Additionally, Aimee Gray discussed exploring her values with her other therapist. Thus, this provider explored how her goals with the clinic help her live a life congruent to her identified values. Thought and feelings were processed. Moreover, she was encouraged to view her eating choices moving forward in the context of her values and by reflecting on how her eating habits help/hinder her from living a life congruent to her values. She agreed and shared recent examples. Positive reinforcement was provided. Notably, this provider discussed her upcoming maternity leave toward the end of November. Aimee Gray acknowledged understanding given the uncertain nature of the circumstances, this provider may be out of the office sooner. This provider recommended she continue meeting with Aimee Snuffer, LCSW for therapeutic  services; she agreed. All questions/concerns were addressed. Aimee Gray denied any concerns. Overall, Aimee Gray was receptive to today's appointment as evidenced by openness to sharing, responsiveness to feedback, and willingness to continue engaging in learned skills.  Mental Status Examination:  Appearance: well groomed and appropriate hygiene  Behavior: appropriate to circumstances Mood: euthymic; tired due to being sick Affect: mood congruent Speech: normal in rate, volume, and tone Eye Contact: appropriate Psychomotor Activity: appropriate Gait: unable to assess Thought Process: linear, logical, and goal directed  Thought Content/Perception: no hallucinations, delusions, bizarre thinking or behavior reported or observed and no evidence of suicidal and homicidal ideation, plan, and intent Orientation: time, person, place, and purpose of appointment Memory/Concentration: memory, attention, language, and fund of knowledge intact  Insight/Judgment: good  Interventions:  Conducted a brief chart review Provided empathic reflections and validation Employed supportive psychotherapy interventions to facilitate reduced distress and to improve coping skills with identified stressors Employed motivational interviewing skills to assess patient's willingness/desire to adhere to recommended medical treatments and assignmentsReviewed values Provided positive reinforcement   DSM-5 Diagnosis(es): 307.59 (F50.8) Other Specified Feeding or Eating Disorder, Emotional Eating Behaviors and 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 Aimee Gray has demonstrated progress in her goal as evidenced by increased awareness of hunger patterns and increased awareness of triggers for emotional eating. Aimee Gray also continues to demonstrate willingness to engage in learned  skill(s).  Plan: The next appointment will be scheduled in one month, which will be via MyChart Video Visit. The next session will focus on working towards the established treatment goal.

## 2020-03-02 ENCOUNTER — Ambulatory Visit (INDEPENDENT_AMBULATORY_CARE_PROVIDER_SITE_OTHER): Payer: BC Managed Care – PPO | Admitting: Psychology

## 2020-03-02 DIAGNOSIS — F411 Generalized anxiety disorder: Secondary | ICD-10-CM

## 2020-03-05 ENCOUNTER — Telehealth (INDEPENDENT_AMBULATORY_CARE_PROVIDER_SITE_OTHER): Payer: BC Managed Care – PPO | Admitting: Psychology

## 2020-03-05 DIAGNOSIS — F419 Anxiety disorder, unspecified: Secondary | ICD-10-CM

## 2020-03-05 DIAGNOSIS — F5089 Other specified eating disorder: Secondary | ICD-10-CM | POA: Diagnosis not present

## 2020-03-13 ENCOUNTER — Other Ambulatory Visit (INDEPENDENT_AMBULATORY_CARE_PROVIDER_SITE_OTHER): Payer: Self-pay | Admitting: Family Medicine

## 2020-03-13 DIAGNOSIS — E559 Vitamin D deficiency, unspecified: Secondary | ICD-10-CM

## 2020-03-16 ENCOUNTER — Ambulatory Visit: Payer: BC Managed Care – PPO | Admitting: Psychology

## 2020-03-16 ENCOUNTER — Encounter (INDEPENDENT_AMBULATORY_CARE_PROVIDER_SITE_OTHER): Payer: Self-pay

## 2020-03-16 NOTE — Telephone Encounter (Signed)
MyChart message sent to pt

## 2020-03-19 ENCOUNTER — Ambulatory Visit (INDEPENDENT_AMBULATORY_CARE_PROVIDER_SITE_OTHER): Payer: BC Managed Care – PPO | Admitting: Psychology

## 2020-03-19 DIAGNOSIS — F411 Generalized anxiety disorder: Secondary | ICD-10-CM

## 2020-03-19 NOTE — Progress Notes (Signed)
  Office: [phone]  /  Fax: [phone]    Date: April 02, 2020   Appointment Start Time: 11:01am Duration: 26 minutes Provider: Lawerance Cruel, Psy.D. Type of Session: Individual Therapy  Location of Patient: Home Location of Provider: Provider's Home Type of Contact: Telepsychological Visit via MyChart Video Visit  Session Content: Aimee Gray is a 49 y.o. female presenting for a follow-up appointment to address the previously established treatment goal of increasing coping skills. Today's appointment was a telepsychological visit due to COVID-19. Aimee Gray provided verbal consent for today's telepsychological appointment and she is aware she is responsible for securing confidentiality on her end of the session. Prior to proceeding with today's appointment, Aimee Gray's physical location at the time of this appointment was obtained as well a phone number she could be reached at in the event of technical difficulties. Aimee Gray and this provider participated in today's telepsychological service.   This provider conducted a brief check-in. Aimee Gray reported, "It's been a rough few weeks" due to a busy schedule. She acknowledged the aforementioned has impacted self-care, adding she is feeling a bit better. A plan was developed to help Aimee Gray cope with emotional eating in the future using learned skills. She wrote down the following plan: focus on hydration; be prepared with snacks congruent to the meal plan; pause to ask questions when experiencing urges/cravings (e.g., Am I really hungry?, Is there something bothering me?, and Will I feel better if I eat?); and engage in discussed coping strategies after going through the aforementioned questions. Aimee Gray was receptive to today's appointment as evidenced by openness to sharing, responsiveness to feedback, and willingness to continue engaging in learned skills.  Mental Status Examination:  Appearance: well groomed and appropriate hygiene  Behavior:  appropriate to circumstances Mood: euthymic Affect: mood congruent Speech: normal in rate, volume, and tone Eye Contact: appropriate Psychomotor Activity: appropriate Gait: unable to assess Thought Process: linear, logical, and goal directed  Thought Content/Perception: no hallucinations, delusions, bizarre thinking or behavior reported or observed and no evidence of suicidal and homicidal ideation, plan, and intent Orientation: time, person, place, and purpose of appointment Memory/Concentration: memory, attention, language, and fund of knowledge intact  Insight/Judgment: fair  Interventions:  Conducted a brief chart review Provided empathic reflections and validation Employed supportive psychotherapy interventions to facilitate reduced distress and to improve coping skills with identified stressors Reviewed learned skills  DSM-5 Diagnosis(es): 307.59 (F50.8) Other Specified Feeding or Eating Disorder, Emotional Eating Behaviors and 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 Aimee Gray demonstrated progress in her goal as evidenced by increased awareness of hunger patterns and increased awareness of triggers for emotional eating. Aimee Gray also continues to demonstrate willingness to engage in learned skill(s).  Plan: Today was Aimee Gray's last appointment with this provider. She acknowledged understanding that she may request a follow-up appointment with this provider in the future (following this provider's maternity leave as previously discussed) as long as she is still established with the clinic. Additionally, Aimee Gray plans to continue services with her other therapist Aimee Snuffer, LCSW. No further follow-up planned by this provider.

## 2020-03-22 ENCOUNTER — Encounter: Payer: Self-pay | Admitting: Registered Nurse

## 2020-03-22 NOTE — Progress Notes (Signed)
Established Patient Office Visit  Subjective:  Patient ID: Aimee Gray, female    DOB: 1970-12-12  Age: 49 y.o. MRN: [id number]  CC:  Chief Complaint  Patient presents with  . Medical Management of Chronic Issues    med check   . Headache    HPI [name] presents for med management  Chronic migraines: has been using maxalt PRN for migraines with good relief. Uses with zofran prn for nausea that occurs with these headaches. Notes that she is close to running out and needs refills. Migraines are not seeming to increase in frequency or intensity beyond expectations. No peripheral neuro symptoms. No red flag symptoms  No other complaints or concerns today.   Past Medical History:  Diagnosis Date  . Allergy   . Anxiety   . Back pain   . Depression   . Infertility, female   . Lactose intolerance   . Migraines   . Palpitations   . PCOS (polycystic ovarian syndrome)     Past Surgical History:  Procedure Laterality Date  . CESAREAN SECTION      Family History  Problem Relation Age of Onset  . Heart disease Mother   . Hypertension Mother   . Thyroid disease Mother   . Depression Mother   . Anxiety disorder Mother   . Sleep apnea Mother   . Obesity Mother   . Diabetes Father   . Heart disease Father   . Hyperlipidemia Father   . Hypertension Father   . Cancer Maternal Grandmother   . Diabetes Maternal Grandfather   . Stroke Maternal Grandfather   . Heart disease Paternal Grandmother   . Hypertension Paternal Grandmother   . Heart disease Paternal Grandfather     Social History   Socioeconomic History  . Marital status: Married    Spouse name: Not on file  . Number of children: Not on file  . Years of education: Not on file  . Highest education level: Not on file  Occupational History  . Not on file  Tobacco Use  . Smoking status: Former Games developer  . Smokeless tobacco: Never Used  . Tobacco comment: quit 5 years ago  Vaping Use  . Vaping Use:  Never assessed  Substance and Sexual Activity  . Alcohol use: No  . Drug use: No  . Sexual activity: Never  Other Topics Concern  . Not on file  Social History Narrative  . Not on file   Social Determinants of Health   Financial Resource Strain:   . Difficulty of Paying Living Expenses: Not on file  Food Insecurity:   . Worried About Programme researcher, broadcasting/film/video in the Last Year: Not on file  . Ran Out of Food in the Last Year: Not on file  Transportation Needs:   . Lack of Transportation (Medical): Not on file  . Lack of Transportation (Non-Medical): Not on file  Physical Activity:   . Days of Exercise per Week: Not on file  . Minutes of Exercise per Session: Not on file  Stress:   . Feeling of Stress : Not on file  Social Connections:   . Frequency of Communication with Friends and Family: Not on file  . Frequency of Social Gatherings with Friends and Family: Not on file  . Attends Religious Services: Not on file  . Active Member of Clubs or Organizations: Not on file  . Attends Banker Meetings: Not on file  . Marital Status: Not on file  Intimate Partner Violence:   . Fear of Current or Ex-Partner: Not on file  . Emotionally Abused: Not on file  . Physically Abused: Not on file  . Sexually Abused: Not on file    Outpatient Medications Prior to Visit  Medication Sig Dispense Refill  . levonorgestrel (MIRENA) 20 MCG/24HR IUD 1 each by Intrauterine route once.    . Melatonin 5 MG CAPS Take 5 mg by mouth.    . rizatriptan ([name]-MLT) 10 MG disintegrating tablet DISSOLVE 1 TAB BY MOUTH AS NEEDED FOR MIGRAINE. MAY REPEAT IN 2 HOURS IF NEEDED. NEEDS OFFICE VISIT 10 tablet 6  . Vitamin D, Ergocalciferol, (DRISDOL) 1.25 MG (50000 UNIT) CAPS capsule Take 1 capsule (50,000 Units total) by mouth every 7 (seven) days. 4 capsule 0  . vortioxetine HBr (TRINTELLIX) 20 MG TABS tablet Take 1 tablet (20 mg total) by mouth daily. 30 tablet 0   No facility-administered medications  prior to visit.    Allergies  Allergen Reactions  . Hydrocodone Nausea And Vomiting    ROS Review of Systems  Constitutional: Negative.   HENT: Negative.   Eyes: Negative.   Respiratory: Negative.   Cardiovascular: Negative.   Gastrointestinal: Negative.   Genitourinary: Negative.   Musculoskeletal: Negative.   Skin: Negative.   Neurological: Negative.   Psychiatric/Behavioral: Negative.       Objective:    Physical Exam Vitals and nursing note reviewed.  Constitutional:      General: She is not in acute distress.    Appearance: Normal appearance. She is normal weight. She is not ill-appearing, toxic-appearing or diaphoretic.  Cardiovascular:     Rate and Rhythm: Normal rate and regular rhythm.     Heart sounds: Normal heart sounds. No murmur heard.  No friction rub. No gallop.   Pulmonary:     Effort: Pulmonary effort is normal. No respiratory distress.     Breath sounds: Normal breath sounds. No stridor. No wheezing, rhonchi or rales.  Chest:     Chest wall: No tenderness.  Skin:    General: Skin is warm and dry.  Neurological:     General: No focal deficit present.     Mental Status: She is alert and oriented to person, place, and time. Mental status is at baseline.  Psychiatric:        Mood and Affect: Mood normal.        Behavior: Behavior normal.        Thought Content: Thought content normal.        Judgment: Judgment normal.     BP 120/82   Pulse 88   Temp 98.5 F (36.9 C) (Temporal)   Ht 5' (1.524 m)   Wt 146 lb 3.2 oz (66.3 kg)   SpO2 96%   BMI 28.55 kg/m  Wt Readings from Last 3 Encounters:  02/06/20 143 lb (64.9 kg)  01/21/20 144 lb (65.3 kg)  01/20/20 146 lb 3.2 oz (66.3 kg)     Health Maintenance Due  Topic Date Due  . Hepatitis C Screening  Never done  . COVID-19 Vaccine (1) Never done  . HIV Screening  Never done  . TETANUS/TDAP  Never done  . PAP SMEAR-Modifier  Never done  . INFLUENZA VACCINE  12/22/2019    There are no  preventive care reminders to display for this patient.  Lab Results  Component Value Date   TSH 2.850 10/28/2019   Lab Results  Component Value Date   WBC 7.0 10/28/2019   HGB  14.8 10/28/2019   HCT 43.7 10/28/2019   MCV 92 10/28/2019   PLT 319 10/28/2019   Lab Results  Component Value Date   NA 139 10/28/2019   K 4.2 10/28/2019   CO2 26 10/28/2019   GLUCOSE 84 10/28/2019   BUN 7 10/28/2019   CREATININE 0.71 10/28/2019   BILITOT <0.2 10/28/2019   ALKPHOS 67 10/28/2019   AST 16 10/28/2019   ALT 17 10/28/2019   PROT 6.4 10/28/2019   ALBUMIN 4.5 10/28/2019   CALCIUM 9.1 10/28/2019   Lab Results  Component Value Date   CHOL 167 10/28/2019   Lab Results  Component Value Date   HDL 45 10/28/2019   Lab Results  Component Value Date   LDLCALC 106 (H) 10/28/2019   Lab Results  Component Value Date   TRIG 84 10/28/2019   Lab Results  Component Value Date   CHOLHDL 3.4 04/17/2017   Lab Results  Component Value Date   HGBA1C 5.2 10/28/2019      Assessment & Plan:   Problem List Items Addressed This Visit    None    Visit Diagnoses    Other migraine without status migrainosus, not intractable    -  Primary      No orders of the defined types were placed in this encounter.   Follow-up: No follow-ups on file.   PLAN  Medications refilled  Follow up prn  Continue to follow with psychiatry for anxiety and depression  Encourage TOC at upcoming visit - pt had been est with Dr. Creta Levin  Patient encouraged to call clinic with any questions, comments, or concerns.  Janeece Agee, NP

## 2020-03-25 ENCOUNTER — Ambulatory Visit (INDEPENDENT_AMBULATORY_CARE_PROVIDER_SITE_OTHER): Payer: BC Managed Care – PPO | Admitting: Family Medicine

## 2020-03-30 ENCOUNTER — Ambulatory Visit (INDEPENDENT_AMBULATORY_CARE_PROVIDER_SITE_OTHER): Payer: BC Managed Care – PPO | Admitting: Adult Health

## 2020-03-30 ENCOUNTER — Other Ambulatory Visit: Payer: Self-pay

## 2020-03-30 ENCOUNTER — Encounter (INDEPENDENT_AMBULATORY_CARE_PROVIDER_SITE_OTHER): Payer: Self-pay | Admitting: Adult Health

## 2020-03-30 ENCOUNTER — Ambulatory Visit: Payer: BC Managed Care – PPO | Admitting: Psychology

## 2020-03-30 VITALS — BP 123/73 | HR 73 | Temp 97.9°F | Ht 60.0 in | Wt 146.0 lb

## 2020-03-30 DIAGNOSIS — F3289 Other specified depressive episodes: Secondary | ICD-10-CM | POA: Diagnosis not present

## 2020-03-30 DIAGNOSIS — E559 Vitamin D deficiency, unspecified: Secondary | ICD-10-CM

## 2020-03-30 DIAGNOSIS — Z9189 Other specified personal risk factors, not elsewhere classified: Secondary | ICD-10-CM

## 2020-03-30 DIAGNOSIS — Z6830 Body mass index (BMI) 30.0-30.9, adult: Secondary | ICD-10-CM

## 2020-03-30 DIAGNOSIS — E669 Obesity, unspecified: Secondary | ICD-10-CM | POA: Diagnosis not present

## 2020-03-30 DIAGNOSIS — E8881 Metabolic syndrome: Secondary | ICD-10-CM

## 2020-03-30 MED ORDER — VORTIOXETINE HBR 20 MG PO TABS: 20.0000 mg | ORAL_TABLET | Freq: Every day | ORAL | 0 refills | Status: DC

## 2020-03-30 MED ORDER — VITAMIN D (ERGOCALCIFEROL) 1.25 MG (50000 UNIT) PO CAPS: 50000.0000 [IU] | ORAL_CAPSULE | ORAL | 0 refills | Status: DC

## 2020-03-31 ENCOUNTER — Ambulatory Visit: Payer: BC Managed Care – PPO | Admitting: Psychology

## 2020-03-31 DIAGNOSIS — E559 Vitamin D deficiency, unspecified: Secondary | ICD-10-CM | POA: Insufficient documentation

## 2020-03-31 DIAGNOSIS — E8881 Metabolic syndrome: Secondary | ICD-10-CM | POA: Insufficient documentation

## 2020-03-31 NOTE — Progress Notes (Signed)
Chief Complaint:   OBESITY Aimee Gray is here to discuss her progress with her obesity treatment plan along with follow-up of her obesity related diagnoses. Aimee Gray is on the Category 2 Plan + 100 and states she is following her eating plan approximately 50% of the time. Aimee Gray states she is walking at work for exercise.   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 Last baseball game is tonight, and then her schedule will soon settle down.    Subjective:   Vitamin D deficiency. Vitamin D level on 10/28/2019 was 32.5, well below goal of 50. Aimee Gray is on Ergocalciferol. No nausea, vomiting, or muscle weakness.    Ref. Range 10/28/2019 11:46  Vitamin D, 25-Hydroxy Latest Ref Range: 30.0 - 100.0 ng/mL 32.5   Insulin resistance. Jerrianne has a diagnosis of insulin resistance based on her elevated fasting insulin level >5. She continues to work on diet and exercise to decrease her risk of diabetes. Insulin level on 10/28/2019 was 13.9 with normal blood glucose and A1c. Aimee Gray is not on metformin and denies polyphagia.  Lab Results  Component Value Date   INSULIN 13.9 10/28/2019   Lab Results  Component Value Date   HGBA1C 5.2 10/28/2019   Other depression, with emotional eating. Aimee Gray is struggling with emotional eating and using food for comfort to the extent that it is negatively impacting her health. She has been working on behavior modification techniques to help reduce her emotional eating and has been somewhat successful. She shows no sign of suicidal or homicidal ideations. Aimee Gray feels stable. She is  seeing Dr. Dewaine Conger and another therapist.  At risk for diabetes mellitus. Aimee Gray is at higher than average risk for developing diabetes due to insulin resistance and obesity.   Assessment/Plan:   Vitamin D deficiency. Low Vitamin D level contributes to fatigue and are associated with obesity, breast, and colon cancer. She was given a refill on her Vitamin D, Ergocalciferol, (DRISDOL) 1.25 MG (50000 UNIT) CAPS capsule every week #4 with 0 refills and will follow-up for routine testing of Vitamin D, at least 2-3 times per year to avoid over-replacement.   Insulin resistance. Aimee Gray will continue to work on weight loss, exercise, and decreasing simple carbohydrates to help decrease the risk of diabetes. Aimee Gray agreed to follow-up with [name] as directed to closely monitor her progress. She will continue to increase protein at each meal.  Other depression, with emotional eating. Behavior modification techniques were discussed today to help Aimee Gray deal with her emotional/non-hunger eating behaviors.  Orders and follow up as documented in patient record. Refill was given for vortioxetine HBr (TRINTELLIX) 20 MG TABS tablet daily #30 with 0 refills.  At risk for diabetes mellitus. Aimee Gray was given approximately 15 minutes of diabetes education and counseling today. We discussed intensive lifestyle modifications today with an emphasis on weight loss as well as increasing exercise and decreasing simple carbohydrates in her diet. We also reviewed medication options with an emphasis on risk versus benefit of those discussed.   Repetitive spaced learning was employed today to elicit superior memory formation and behavioral change.  Class 1  obesity with serious comorbidity and body mass index (BMI) of 30.0 to 30.9 in adult, unspecified obesity type - BMI greater than 30 at start of program.  Aimee Gray is currently in the action stage of change. As such, her goal is to continue with weight loss efforts. She has  agreed to the Category 2 Plan + 100  calories.   Exercise goals: Aimee Gray will continue walking at work.  Behavioral modification strategies: increasing lean protein intake, increasing water intake, no skipping meals, meal planning and cooking strategies, holiday eating strategies  and planning for success.  She will ask her husband to assist with grocery shopping and meal planning/prepping.  Aimee Gray has agreed to follow-up with our clinic fasting in 3 weeks. She was informed of the importance of frequent follow-up visits to maximize her success with intensive lifestyle modifications for her multiple health conditions.   Objective:   Blood pressure 123/73, pulse 73, temperature 97.9 F (36.6 C), height 5' (1.524 m), weight 146 lb (66.2 kg), SpO2 97 %. Body mass index is 28.51 kg/m.  General: Cooperative, alert, well developed, in no acute distress. HEENT: Conjunctivae and lids unremarkable. Cardiovascular: Regular rhythm.  Lungs: Normal work of breathing. Neurologic: No focal deficits.   Lab Results  Component Value Date   CREATININE 0.71 10/28/2019   BUN 7 10/28/2019   NA 139 10/28/2019   K 4.2 10/28/2019   CL 102 10/28/2019   CO2 26 10/28/2019   Lab Results  Component Value Date   ALT 17 10/28/2019   AST 16 10/28/2019   ALKPHOS 67 10/28/2019   BILITOT <0.2 10/28/2019   Lab Results  Component Value Date   HGBA1C 5.2 10/28/2019   Lab Results  Component Value Date   INSULIN 13.9 10/28/2019   Lab Results  Component Value Date   TSH 2.850 10/28/2019   Lab Results  Component Value Date   CHOL 167 10/28/2019   HDL 45 10/28/2019   LDLCALC 106 (H) 10/28/2019   TRIG 84 10/28/2019   CHOLHDL 3.4 04/17/2017   Lab Results  Component Value Date   WBC 7.0 10/28/2019   HGB 14.8 10/28/2019   HCT 43.7 10/28/2019   MCV 92 10/28/2019   PLT 319 10/28/2019   No results found for: IRON, TIBC, FERRITIN  Attestation Statements:   Reviewed by clinician on day of visit:  allergies, medications, problem list, medical history, surgical history, family history, social history, and previous encounter notes.  I, Marianna Payment, am acting as Energy manager for [employer], NP-C   I have reviewed the above documentation for accuracy and completeness, and I agree with the above. -  Elener Custodio d. Dierra Riesgo, NP-C

## 2020-04-02 ENCOUNTER — Telehealth (INDEPENDENT_AMBULATORY_CARE_PROVIDER_SITE_OTHER): Payer: BC Managed Care – PPO | Admitting: Psychology

## 2020-04-02 ENCOUNTER — Ambulatory Visit: Payer: BC Managed Care – PPO | Admitting: Psychology

## 2020-04-02 DIAGNOSIS — F419 Anxiety disorder, unspecified: Secondary | ICD-10-CM | POA: Diagnosis not present

## 2020-04-02 DIAGNOSIS — F5089 Other specified eating disorder: Secondary | ICD-10-CM | POA: Diagnosis not present

## 2020-04-13 ENCOUNTER — Ambulatory Visit (INDEPENDENT_AMBULATORY_CARE_PROVIDER_SITE_OTHER): Payer: BC Managed Care – PPO | Admitting: Psychology

## 2020-04-13 DIAGNOSIS — F411 Generalized anxiety disorder: Secondary | ICD-10-CM | POA: Diagnosis not present

## 2020-04-20 ENCOUNTER — Other Ambulatory Visit: Payer: Self-pay

## 2020-04-20 ENCOUNTER — Ambulatory Visit (INDEPENDENT_AMBULATORY_CARE_PROVIDER_SITE_OTHER): Payer: BC Managed Care – PPO | Admitting: Family Medicine

## 2020-04-20 ENCOUNTER — Encounter (INDEPENDENT_AMBULATORY_CARE_PROVIDER_SITE_OTHER): Payer: Self-pay | Admitting: Family Medicine

## 2020-04-20 VITALS — BP 128/80 | HR 86 | Temp 98.2°F | Ht 60.0 in | Wt 150.0 lb

## 2020-04-20 DIAGNOSIS — Z9189 Other specified personal risk factors, not elsewhere classified: Secondary | ICD-10-CM

## 2020-04-20 DIAGNOSIS — F32A Depression, unspecified: Secondary | ICD-10-CM

## 2020-04-20 DIAGNOSIS — F419 Anxiety disorder, unspecified: Secondary | ICD-10-CM | POA: Diagnosis not present

## 2020-04-20 DIAGNOSIS — E559 Vitamin D deficiency, unspecified: Secondary | ICD-10-CM

## 2020-04-20 DIAGNOSIS — E669 Obesity, unspecified: Secondary | ICD-10-CM | POA: Diagnosis not present

## 2020-04-20 DIAGNOSIS — Z6830 Body mass index (BMI) 30.0-30.9, adult: Secondary | ICD-10-CM

## 2020-04-20 MED ORDER — VORTIOXETINE HBR 20 MG PO TABS: 20.0000 mg | ORAL_TABLET | Freq: Every day | ORAL | 0 refills | Status: DC

## 2020-04-21 NOTE — Progress Notes (Signed)
Chief Complaint:   OBESITY Aimee Gray is here to discuss her progress with her obesity treatment plan along with follow-up of her obesity related diagnoses. Aimee Gray is on the Category 2 Plan and states she is following her eating plan approximately 60% of the time. Aimee Gray states she is walking 7,000-8,000 steps 5 days per week.  Today's visit was #: 10 Starting weight: 156 lbs Starting date: 10/28/2019 Today's weight: 150 lbs Today's date: 04/20/2020 Total lbs lost to date: 6 lbs Total lbs lost since last in-office visit: 0  Interim History: Aimee Gray went away for Thanksgiving to the beach and was finally able to slow down.  She is really struggling with job dissatisfaction and really is not sleeping well.  She is feeling the domino effect of not sleeping well and not eating well.  She went shopping yesterday.  Subjective:   1. Vitamin D deficiency Aimee Gray's Vitamin D level was 32.5 on 10/28/2019. She is currently taking prescription vitamin D 50,000 IU each week. She denies nausea, vomiting or muscle weakness.  She endorses fatigue.  2. Anxiety and depression Denies SI/HI.  On Trintellix 20 mg daily.  Still experiencing symptoms of anxiety.  3. At risk for osteoporosis Aimee Gray is at higher risk of osteopenia and osteoporosis due to Vitamin D deficiency.   Assessment/Plan:   1. Vitamin D deficiency Low Vitamin D level contributes to fatigue and are associated with obesity, breast, and colon cancer. She agrees to continue to take prescription Vitamin D @50 ,000 IU every week and will follow-up for routine testing of Vitamin D, at least 2-3 times per year to avoid over-replacement.  No refill needed.  2. Anxiety and depression Refill Trintellix, as per below.  -Refill vortioxetine HBr (TRINTELLIX) 20 MG TABS tablet; Take 1 tablet (20 mg total) by mouth daily.  Dispense: 30 tablet; Refill: 0  3. At risk for osteoporosis Aimee Gray was given approximately 15 minutes of osteoporosis  prevention counseling today. Aimee Gray is at risk for osteopenia and osteoporosis due to her Vitamin D deficiency. She was encouraged to take her Vitamin D and follow her higher calcium diet and increase strengthening exercise to help strengthen her bones and decrease her risk of osteopenia and osteoporosis.  Repetitive spaced learning was employed today to elicit superior memory formation and behavioral change.  4. Class 1 obesity with serious comorbidity and body mass index (BMI) of 30.0 to 30.9 in adult, unspecified obesity type  Aimee Gray is currently in the action stage of change. As such, her goal is to continue with weight loss efforts. She has agreed to the Category 2 Plan.   Exercise goals: As is.  Behavioral modification strategies: increasing lean protein intake, meal planning and cooking strategies, keeping healthy foods in the home and planning for success.  Aimee Gray has agreed to follow-up with our clinic in 3 weeks. She was informed of the importance of frequent follow-up visits to maximize her success with intensive lifestyle modifications for her multiple health conditions.   Objective:   Blood pressure 128/80, pulse 86, temperature 98.2 F (36.8 C), temperature source Oral, height 5' (1.524 m), weight 150 lb (68 kg), SpO2 97 %. Body mass index is 29.29 kg/m.  General: Cooperative, alert, well developed, in no acute distress. HEENT: Conjunctivae and lids unremarkable. Cardiovascular: Regular rhythm.  Lungs: Normal work of breathing. Neurologic: No focal deficits.   Lab Results  Component Value Date   CREATININE 0.71 10/28/2019   BUN 7 10/28/2019   NA 139 10/28/2019   K  4.2 10/28/2019   CL 102 10/28/2019   CO2 26 10/28/2019   Lab Results  Component Value Date   ALT 17 10/28/2019   AST 16 10/28/2019   ALKPHOS 67 10/28/2019   BILITOT <0.2 10/28/2019   Lab Results  Component Value Date   HGBA1C 5.2 10/28/2019   Lab Results  Component Value Date   INSULIN 13.9  10/28/2019   Lab Results  Component Value Date   TSH 2.850 10/28/2019   Lab Results  Component Value Date   CHOL 167 10/28/2019   HDL 45 10/28/2019   LDLCALC 106 (H) 10/28/2019   TRIG 84 10/28/2019   CHOLHDL 3.4 04/17/2017   Lab Results  Component Value Date   WBC 7.0 10/28/2019   HGB 14.8 10/28/2019   HCT 43.7 10/28/2019   MCV 92 10/28/2019   PLT 319 10/28/2019   Attestation Statements:   Reviewed by clinician on day of visit: allergies, medications, problem list, medical history, surgical history, family history, social history, and previous encounter notes.  I, Insurance claims handler, CMA, am acting as transcriptionist for Reuben Likes, MD.  I have reviewed the above documentation for accuracy and completeness, and I agree with the above. - Katherina Mires, MD

## 2020-04-27 ENCOUNTER — Ambulatory Visit (INDEPENDENT_AMBULATORY_CARE_PROVIDER_SITE_OTHER): Payer: BC Managed Care – PPO | Admitting: Psychology

## 2020-04-27 DIAGNOSIS — F411 Generalized anxiety disorder: Secondary | ICD-10-CM | POA: Diagnosis not present

## 2020-05-11 ENCOUNTER — Encounter (INDEPENDENT_AMBULATORY_CARE_PROVIDER_SITE_OTHER): Payer: Self-pay | Admitting: Family Medicine

## 2020-05-11 ENCOUNTER — Ambulatory Visit (INDEPENDENT_AMBULATORY_CARE_PROVIDER_SITE_OTHER): Payer: BC Managed Care – PPO | Admitting: Psychology

## 2020-05-11 ENCOUNTER — Telehealth (INDEPENDENT_AMBULATORY_CARE_PROVIDER_SITE_OTHER): Payer: BC Managed Care – PPO | Admitting: Family Medicine

## 2020-05-11 DIAGNOSIS — F32A Depression, unspecified: Secondary | ICD-10-CM

## 2020-05-11 DIAGNOSIS — F419 Anxiety disorder, unspecified: Secondary | ICD-10-CM

## 2020-05-11 DIAGNOSIS — E669 Obesity, unspecified: Secondary | ICD-10-CM | POA: Diagnosis not present

## 2020-05-11 DIAGNOSIS — F411 Generalized anxiety disorder: Secondary | ICD-10-CM | POA: Diagnosis not present

## 2020-05-11 DIAGNOSIS — E559 Vitamin D deficiency, unspecified: Secondary | ICD-10-CM | POA: Diagnosis not present

## 2020-05-11 DIAGNOSIS — Z6830 Body mass index (BMI) 30.0-30.9, adult: Secondary | ICD-10-CM

## 2020-05-11 MED ORDER — VITAMIN D (ERGOCALCIFEROL) 1.25 MG (50000 UNIT) PO CAPS: 50000.0000 [IU] | ORAL_CAPSULE | ORAL | 0 refills | Status: DC

## 2020-05-12 NOTE — Progress Notes (Signed)
TeleHealth Visit:  Due to the COVID-19 pandemic, this visit was completed with telemedicine (audio/video) technology to reduce patient and provider exposure as well as to preserve personal protective equipment.   Dula has verbally consented to this TeleHealth visit. The patient is located at home, the provider is located at the [facility] office. The participants in this visit include the listed provider and patient. The visit was conducted today via MyChart video.   Chief Complaint: OBESITY Aimee Gray is here to discuss her progress with her obesity treatment plan along with follow-up of her obesity related diagnoses. Aimee Gray is on the Category 2 Plan and states she is following her eating plan approximately 85% of the time. [name] states she is doing 0 minutes 0 times per week.  Today's visit was #: 11 Starting weight: 156 lbs Starting date: 10/28/2019  Interim History: Aimee Gray has been ill for 2 weeks and she felt ill again this morning with scratchy throat. She reports a weight of 147 lbs this morning. She was sleeping a bunch while ill. She mentions that she didn't eat much while ill. She has gotten on a more consistent eating plan this week. She has regifted most of the candy given to her for Christmas. She thinks she should start tracking food to see what she is actually getting in.  Subjective:   1. Vitamin D deficiency Female denies nausea, vomiting, or muscle weakness, but notes fatigue. She is on prescription Vit D.  2. Anxiety and depression Aimee Gray denies suicidal or homicidal ideas on Trintellix. She reports she feels well controlled in terms of symptoms.  Assessment/Plan:   1. Vitamin D deficiency Low Vitamin D level contributes to fatigue and are associated with obesity, breast, and colon cancer. We will refill prescription Vitamin D for 1 month. Nolyn will follow-up for routine testing of Vitamin D, at least 2-3 times per year to avoid  over-replacement.  - Vitamin D, Ergocalciferol, (DRISDOL) 1.25 MG (50000 UNIT) CAPS capsule; Take 1 capsule (50,000 Units total) by mouth every 7 (seven) days.  Dispense: 4 capsule; Refill: 0  2. Anxiety and depression Behavior modification techniques were discussed today to help Aimee Gray deal with her anxiety and depression. She will continue to follow up with her therapist and continue her current medication. Orders and follow up as documented in patient record.   3. Class 1 obesity with serious comorbidity and body mass index (BMI) of 30.0 to 30.9 in adult, unspecified obesity type Aimee Gray is currently in the action stage of change. As such, her goal is to continue with weight loss efforts. She has agreed to the Category 2 Plan + 200 calories.   Exercise goals: No exercise has been prescribed at this time.  Behavioral modification strategies: increasing lean protein intake, meal planning and cooking strategies, holiday eating strategies  and planning for success.  Aimee Gray has agreed to follow-up with our clinic in 3 weeks. She was informed of the importance of frequent follow-up visits to maximize her success with intensive lifestyle modifications for her multiple health conditions.  Objective:   VITALS: Per patient if applicable, see vitals. GENERAL: Alert and in no acute distress. CARDIOPULMONARY: No increased WOB. Speaking in clear sentences.  PSYCH: Pleasant and cooperative. Speech normal rate and rhythm. Affect is appropriate. Insight and judgement are appropriate. Attention is focused, linear, and appropriate.  NEURO: Oriented as arrived to appointment on time with no prompting.   Lab Results  Component Value Date   CREATININE 0.71 10/28/2019  BUN 7 10/28/2019   NA 139 10/28/2019   K 4.2 10/28/2019   CL 102 10/28/2019   CO2 26 10/28/2019   Lab Results  Component Value Date   ALT 17 10/28/2019   AST 16 10/28/2019   ALKPHOS 67 10/28/2019   BILITOT <0.2 10/28/2019   Lab  Results  Component Value Date   HGBA1C 5.2 10/28/2019   Lab Results  Component Value Date   INSULIN 13.9 10/28/2019   Lab Results  Component Value Date   TSH 2.850 10/28/2019   Lab Results  Component Value Date   CHOL 167 10/28/2019   HDL 45 10/28/2019   LDLCALC 106 (H) 10/28/2019   TRIG 84 10/28/2019   CHOLHDL 3.4 04/17/2017   Lab Results  Component Value Date   WBC 7.0 10/28/2019   HGB 14.8 10/28/2019   HCT 43.7 10/28/2019   MCV 92 10/28/2019   PLT 319 10/28/2019   No results found for: IRON, TIBC, FERRITIN  Attestation Statements:   Reviewed by clinician on day of visit: allergies, medications, problem list, medical history, surgical history, family history, social history, and previous encounter notes.   I, Burt Knack, am acting as transcriptionist for Reuben Likes, MD.  I have reviewed the above documentation for accuracy and completeness, and I agree with the above. - Katherina Mires, MD

## 2020-05-18 ENCOUNTER — Encounter: Payer: Self-pay | Admitting: Family Medicine

## 2020-05-18 ENCOUNTER — Telehealth (INDEPENDENT_AMBULATORY_CARE_PROVIDER_SITE_OTHER): Payer: BC Managed Care – PPO | Admitting: Family Medicine

## 2020-05-18 ENCOUNTER — Other Ambulatory Visit: Payer: Self-pay

## 2020-05-18 DIAGNOSIS — B379 Candidiasis, unspecified: Secondary | ICD-10-CM | POA: Diagnosis not present

## 2020-05-18 DIAGNOSIS — B9689 Other specified bacterial agents as the cause of diseases classified elsewhere: Secondary | ICD-10-CM

## 2020-05-18 DIAGNOSIS — J329 Chronic sinusitis, unspecified: Secondary | ICD-10-CM | POA: Diagnosis not present

## 2020-05-18 MED ORDER — AMOXICILLIN-POT CLAVULANATE 875-125 MG PO TABS: 1.0000 | ORAL_TABLET | Freq: Two times a day (BID) | ORAL | 0 refills | Status: AC

## 2020-05-18 MED ORDER — FLUCONAZOLE 150 MG PO TABS: 150.0000 mg | ORAL_TABLET | Freq: Once | ORAL | 0 refills | Status: AC

## 2020-05-18 MED ORDER — AFRIN NASAL SPRAY 0.05 % NA SOLN: 1.0000 | Freq: Two times a day (BID) | NASAL | 0 refills | Status: DC

## 2020-05-18 NOTE — Progress Notes (Addendum)
Virtual Visit Note  I connected with patient on 05/18/20 at 1348 by Epic video visit and verified that I am speaking with the correct person using two identifiers. Aimee Gray is currently located at home and no family members are currently with them during visit. The provider, Azalee Course Motty Borin, FNP is located in their office at time of visit.  I discussed the limitations, risks, security and privacy concerns of performing an evaluation and management service by telephone and the availability of in person appointments. I also discussed with the patient that there may be a patient responsible charge related to this service. The patient expressed understanding and agreed to proceed.   I provided 20 minutes of non-face-to-face time during this encounter.  Chief Complaint  Patient presents with  . head cold     Flonase, sudafed, saline spray x 4  weeks since thanksgiving     HPI ? Covid negative 1st week of December Symptoms improved and then returned Got COVID booster last Friday Head cold symptoms have continued Has been using flonase, nasal spray, and sudafed Has noticed a poor taste in her mouth    Allergies  Allergen Reactions  . Hydrocodone Nausea And Vomiting    Prior to Admission medications   Medication Sig Start Date End Date Taking? Authorizing Provider  levonorgestrel (MIRENA) 20 MCG/24HR IUD 1 each by Intrauterine route once.   Yes [provider]  Melatonin 5 MG CAPS Take 5 mg by mouth.   Yes [provider]  ondansetron (ZOFRAN) 4 MG tablet Take 1 tablet (4 mg total) by mouth every 8 (eight) hours as needed for nausea or vomiting. 01/28/20  Yes Janeece Agee, NP  rizatriptan ([name]-MLT) 10 MG disintegrating tablet DISSOLVE 1 TAB BY MOUTH AS NEEDED FOR MIGRAINE. MAY REPEAT IN 2 HOURS IF NEEDED. NEEDS OFFICE VISIT 01/28/20  Yes Janeece Agee, NP  Vitamin D, Ergocalciferol, (DRISDOL) 1.25 MG (50000 UNIT) CAPS capsule Take 1 capsule (50,000 Units  total) by mouth every 7 (seven) days. 05/11/20  Yes Langston Reusing, MD  vortioxetine HBr (TRINTELLIX) 20 MG TABS tablet Take 1 tablet (20 mg total) by mouth daily. 04/20/20  Yes Langston Reusing, MD    Past Medical History:  Diagnosis Date  . Allergy   . Anxiety   . Back pain   . Depression   . Infertility, female   . Lactose intolerance   . Migraines   . Palpitations   . PCOS (polycystic ovarian syndrome)     Past Surgical History:  Procedure Laterality Date  . CESAREAN SECTION      Social History   Tobacco Use  . Smoking status: Former Games developer  . Smokeless tobacco: Never Used  . Tobacco comment: quit 5 years ago  Substance Use Topics  . Alcohol use: No    Family History  Problem Relation Age of Onset  . Heart disease Mother   . Hypertension Mother   . Thyroid disease Mother   . Depression Mother   . Anxiety disorder Mother   . Sleep apnea Mother   . Obesity Mother   . Diabetes Father   . Heart disease Father   . Hyperlipidemia Father   . Hypertension Father   . Cancer Maternal Grandmother   . Diabetes Maternal Grandfather   . Stroke Maternal Grandfather   . Heart disease Paternal Grandmother   . Hypertension Paternal Grandmother   . Heart disease Paternal Grandfather     Review of Systems  Constitutional: Negative  for chills, fever, malaise/fatigue and weight loss.  HENT: Positive for congestion, ear discharge, ear pain and sinus pain.   Eyes: Negative for pain, discharge and redness.  Respiratory: Positive for sputum production. Negative for cough, shortness of breath and wheezing.   Cardiovascular: Negative for chest pain and palpitations.  Gastrointestinal: Negative for heartburn, nausea and vomiting.  Musculoskeletal: Negative for myalgias.  Neurological: Positive for headaches.    Objective  Constitutional:      General: She is not in acute distress.    Appearance: Normal appearance. She is not ill-appearing.   Pulmonary:      Effort: Pulmonary effort is normal. No respiratory distress.  Neurological:     Mental Status: She is alert and oriented to person, place, and time.  Psychiatric:        Mood and Affect: Mood normal.        Behavior: Behavior normal.     ASSESSMENT and PLAN  Problem List Items Addressed This Visit   None   Visit Diagnoses    Bacterial sinusitis    -  Primary   Relevant Medications   amoxicillin-clavulanate (AUGMENTIN) 875-125 MG tablet bid x 5 days   fluconazole (DIFLUCAN) 150 MG tablet   oxymetazoline (AFRIN NASAL SPRAY) 0.05 % nasal spray  RTC precautions provided R/se/b of medications discussed Continue conservative treatments.   Yeast infection       Relevant Medications   fluconazole (DIFLUCAN) 150 MG tablet: prophylaxis       Return if symptoms worsen or fail to improve.    The above assessment and management plan was discussed with the patient. The patient verbalized understanding of and has agreed to the management plan. Patient is aware to call the clinic if symptoms persist or worsen. Patient is aware when to return to the clinic for a follow-up visit. Patient educated on when it is appropriate to go to the emergency department.     Macario Carls Olufemi Mofield, FNP-BC Primary Care at [facility] [address] [place], [place] [address] Ph.  [phone] Fax [phone]  I have reviewed and agree with above documentation. Edwina Barth, MD

## 2020-05-18 NOTE — Patient Instructions (Signed)
Sinusitis, Adult Sinusitis is inflammation of your sinuses. Sinuses are hollow spaces in the bones around your face. Your sinuses are located:  Around your eyes.  In the middle of your forehead.  Behind your nose.  In your cheekbones. Mucus normally drains out of your sinuses. When your nasal tissues become inflamed or swollen, mucus can become trapped or blocked. This allows bacteria, viruses, and fungi to grow, which leads to infection. Most infections of the sinuses are caused by a virus. Sinusitis can develop quickly. It can last for up to 4 weeks (acute) or for more than 12 weeks (chronic). Sinusitis often develops after a cold. What are the causes? This condition is caused by anything that creates swelling in the sinuses or stops mucus from draining. This includes:  Allergies.  Asthma.  Infection from bacteria or viruses.  Deformities or blockages in your nose or sinuses.  Abnormal growths in the nose (nasal polyps).  Pollutants, such as chemicals or irritants in the air.  Infection from fungi (rare). What increases the risk? You are more likely to develop this condition if you:  Have a weak body defense system (immune system).  Do a lot of swimming or diving.  Overuse nasal sprays.  Smoke. What are the signs or symptoms? The main symptoms of this condition are pain and a feeling of pressure around the affected sinuses. Other symptoms include:  Stuffy nose or congestion.  Thick drainage from your nose.  Swelling and warmth over the affected sinuses.  Headache.  Upper toothache.  A cough that may get worse at night.  Extra mucus that collects in the throat or the back of the nose (postnasal drip).  Decreased sense of smell and taste.  Fatigue.  A fever.  Sore throat.  Bad breath. How is this diagnosed? This condition is diagnosed based on:  Your symptoms.  Your medical history.  A physical exam.  Tests to find out if your condition is  acute or chronic. This may include: ? Checking your nose for nasal polyps. ? Viewing your sinuses using a device that has a light (endoscope). ? Testing for allergies or bacteria. ? Imaging tests, such as an MRI or CT scan. In rare cases, a bone biopsy may be done to rule out more serious types of fungal sinus disease. How is this treated? Treatment for sinusitis depends on the cause and whether your condition is chronic or acute.  If caused by a virus, your symptoms should go away on their own within 10 days. You may be given medicines to relieve symptoms. They include: ? Medicines that shrink swollen nasal passages (topical intranasal decongestants). ? Medicines that treat allergies (antihistamines). ? A spray that eases inflammation of the nostrils (topical intranasal corticosteroids). ? Rinses that help get rid of thick mucus in your nose (nasal saline washes).  If caused by bacteria, your health care provider may recommend waiting to see if your symptoms improve. Most bacterial infections will get better without antibiotic medicine. You may be given antibiotics if you have: ? A severe infection. ? A weak immune system.  If caused by narrow nasal passages or nasal polyps, you may need to have surgery. Follow these instructions at home: Medicines  Take, use, or apply over-the-counter and prescription medicines only as told by your health care provider. These may include nasal sprays.  If you were prescribed an antibiotic medicine, take it as told by your health care provider. Do not stop taking the antibiotic even if you start   to feel better. Hydrate and humidify   Drink enough fluid to keep your urine pale yellow. Staying hydrated will help to thin your mucus.  Use a cool mist humidifier to keep the humidity level in your home above 50%.  Inhale steam for 10-15 minutes, 3-4 times a day, or as told by your health care provider. You can do this in the bathroom while a hot shower is  running.  Limit your exposure to cool or dry air. Rest  Rest as much as possible.  Sleep with your head raised (elevated).  Make sure you get enough sleep each night. General instructions   Apply a warm, moist washcloth to your face 3-4 times a day or as told by your health care provider. This will help with discomfort.  Wash your hands often with soap and water to reduce your exposure to germs. If soap and water are not available, use hand sanitizer.  Do not smoke. Avoid being around people who are smoking (secondhand smoke).  Keep all follow-up visits as told by your health care provider. This is important. Contact a health care provider if:  You have a fever.  Your symptoms get worse.  Your symptoms do not improve within 10 days. Get help right away if:  You have a severe headache.  You have persistent vomiting.  You have severe pain or swelling around your face or eyes.  You have vision problems.  You develop confusion.  Your neck is stiff.  You have trouble breathing. Summary  Sinusitis is soreness and inflammation of your sinuses. Sinuses are hollow spaces in the bones around your face.  This condition is caused by nasal tissues that become inflamed or swollen. The swelling traps or blocks the flow of mucus. This allows bacteria, viruses, and fungi to grow, which leads to infection.  If you were prescribed an antibiotic medicine, take it as told by your health care provider. Do not stop taking the antibiotic even if you start to feel better.  Keep all follow-up visits as told by your health care provider. This is important. This information is not intended to replace advice given to you by your health care provider. Make sure you discuss any questions you have with your health care provider. Document Revised: 10/09/2017 Document Reviewed: 10/09/2017 Elsevier Patient Education  2020 [employer]  

## 2020-05-25 ENCOUNTER — Ambulatory Visit: Payer: BC Managed Care – PPO | Admitting: Psychology

## 2020-05-26 ENCOUNTER — Other Ambulatory Visit (INDEPENDENT_AMBULATORY_CARE_PROVIDER_SITE_OTHER): Payer: Self-pay | Admitting: Family Medicine

## 2020-05-26 ENCOUNTER — Ambulatory Visit: Payer: Self-pay | Admitting: Psychology

## 2020-05-26 DIAGNOSIS — F419 Anxiety disorder, unspecified: Secondary | ICD-10-CM

## 2020-05-26 DIAGNOSIS — F32A Depression, unspecified: Secondary | ICD-10-CM

## 2020-05-28 ENCOUNTER — Other Ambulatory Visit: Payer: Self-pay

## 2020-05-28 ENCOUNTER — Telehealth (INDEPENDENT_AMBULATORY_CARE_PROVIDER_SITE_OTHER): Payer: BC Managed Care – PPO | Admitting: Family Medicine

## 2020-05-28 ENCOUNTER — Encounter (INDEPENDENT_AMBULATORY_CARE_PROVIDER_SITE_OTHER): Payer: Self-pay | Admitting: Family Medicine

## 2020-05-28 DIAGNOSIS — E669 Obesity, unspecified: Secondary | ICD-10-CM | POA: Diagnosis not present

## 2020-05-28 DIAGNOSIS — Z6830 Body mass index (BMI) 30.0-30.9, adult: Secondary | ICD-10-CM

## 2020-05-28 DIAGNOSIS — E8881 Metabolic syndrome: Secondary | ICD-10-CM

## 2020-05-28 DIAGNOSIS — F3289 Other specified depressive episodes: Secondary | ICD-10-CM | POA: Diagnosis not present

## 2020-05-28 MED ORDER — VORTIOXETINE HBR 20 MG PO TABS: 20.0000 mg | ORAL_TABLET | Freq: Every day | ORAL | 0 refills | Status: DC

## 2020-06-08 ENCOUNTER — Ambulatory Visit (INDEPENDENT_AMBULATORY_CARE_PROVIDER_SITE_OTHER): Payer: BC Managed Care – PPO | Admitting: Psychology

## 2020-06-08 DIAGNOSIS — F411 Generalized anxiety disorder: Secondary | ICD-10-CM

## 2020-06-08 NOTE — Progress Notes (Signed)
TeleHealth Visit:  Due to the COVID-19 pandemic, this visit was completed with telemedicine (audio/video) technology to reduce patient and provider exposure as well as to preserve personal protective equipment.   Aimee Gray has verbally consented to this TeleHealth visit. The patient is located at home, the provider is located at the [facility] office. The participants in this visit include the listed provider and patient. The visit was conducted today via Mychart video.  Chief Complaint: OBESITY Aimee Gray is here to discuss her progress with her obesity treatment plan along with follow-up of her obesity related diagnoses. Aimee Gray is on the Category 2 Plan and states she is following her eating plan approximately 60% of the time. Aimee Gray states she is not exercising since the last visit.   Today's visit was #: 12 Starting weight: 156 lbs Starting date: 10/28/19  Interim History: Aimee Gray is struggling with Covid symptoms. She mentions that she was somewhat hopeful that she had Covid to be done with it. Aimee Gray hasn't been most compliant with her meal plan secondary to a decrease and change in taste. Her weight is reported at 150 pounds.  Subjective:   1. Insulin resistance Aimee Gray has a diagnosis of insulin resistance based on her elevated fasting insulin level of 13.9 and an A1c of 5.2 on 10/28/19.She is not on medication. She continues to work on diet and exercise to decrease her risk of diabetes.  Lab Results  Component Value Date   INSULIN 13.9 10/28/2019   Lab Results  Component Value Date   HGBA1C 5.2 10/28/2019   2. Other depression, with emotional eating Aimee Gray is on Trintellix and her symptoms are relatively controlled on Trintellix. She is struggling with emotional eating and using food for comfort to the extent that it is negatively impacting her health. She has been working on behavior modification techniques to help reduce her emotional eating and has been  somewhat successful. She shows no sign of suicidal or homicidal ideations.  Assessment/Plan:   1. Insulin resistance Aimee Gray will continue to work on weight loss, exercise, and decreasing simple carbohydrates to help decrease the risk of diabetes. Aimee Gray agreed to follow-up with [name] as directed to closely monitor her progress. Labs will be ordered at her next visit.  2. Other depression, with emotional eating Behavior modification techniques were discussed today to help Aimee Gray deal with her emotional/non-hunger eating behaviors.  Orders and follow up as documented in patient record. Aimee Gray agrees to continue taking Trintellix 20mg  daily.  - vortioxetine HBr (TRINTELLIX) 20 MG TABS tablet; Take 1 tablet (20 mg total) by mouth daily.  Dispense: 30 tablet; Refill: 0  3. Class 1 obesity with serious comorbidity and body mass index (BMI) of 30.0 to 30.9 in adult, unspecified obesity type  Aimee Gray is currently in the action stage of change. As such, her goal is to continue with weight loss efforts. She has agreed to the Category 2 Plan.   Exercise goals: No exercise has been prescribed at this time.  Behavioral modification strategies: increasing lean protein intake, meal planning and cooking strategies, keeping healthy foods in the home and planning for success.  Aimee Gray has agreed to follow-up with our clinic in 2 weeks. She was informed of the importance of frequent follow-up visits to maximize her success with intensive lifestyle modifications for her multiple health conditions.  Objective:   VITALS: Per patient if applicable, see vitals. GENERAL: Alert and in no acute distress. CARDIOPULMONARY: No increased WOB. Speaking in clear sentences.  PSYCH: Pleasant  and cooperative. Speech normal rate and rhythm. Affect is appropriate. Insight and judgement are appropriate. Attention is focused, linear, and appropriate.  NEURO: Oriented as arrived to appointment on time with no prompting.   Lab  Results  Component Value Date   CREATININE 0.71 10/28/2019   BUN 7 10/28/2019   NA 139 10/28/2019   K 4.2 10/28/2019   CL 102 10/28/2019   CO2 26 10/28/2019   Lab Results  Component Value Date   ALT 17 10/28/2019   AST 16 10/28/2019   ALKPHOS 67 10/28/2019   BILITOT <0.2 10/28/2019   Lab Results  Component Value Date   HGBA1C 5.2 10/28/2019   Lab Results  Component Value Date   INSULIN 13.9 10/28/2019   Lab Results  Component Value Date   TSH 2.850 10/28/2019   Lab Results  Component Value Date   CHOL 167 10/28/2019   HDL 45 10/28/2019   LDLCALC 106 (H) 10/28/2019   TRIG 84 10/28/2019   CHOLHDL 3.4 04/17/2017   Lab Results  Component Value Date   WBC 7.0 10/28/2019   HGB 14.8 10/28/2019   HCT 43.7 10/28/2019   MCV 92 10/28/2019   PLT 319 10/28/2019   No results found for: IRON, TIBC, FERRITIN  Attestation Statements:   Reviewed by clinician on day of visit: allergies, medications, problem list, medical history, surgical history, family history, social history, and previous encounter notes.   IKirke Corin, CMA, am acting as transcriptionist for Reuben Likes, MD   I have reviewed the above documentation for accuracy and completeness, and I agree with the above. - Katherina Mires, MD

## 2020-06-11 ENCOUNTER — Telehealth (INDEPENDENT_AMBULATORY_CARE_PROVIDER_SITE_OTHER): Payer: BC Managed Care – PPO | Admitting: Family Medicine

## 2020-06-11 ENCOUNTER — Other Ambulatory Visit: Payer: Self-pay

## 2020-06-11 ENCOUNTER — Encounter (INDEPENDENT_AMBULATORY_CARE_PROVIDER_SITE_OTHER): Payer: Self-pay | Admitting: Family Medicine

## 2020-06-11 DIAGNOSIS — Z6830 Body mass index (BMI) 30.0-30.9, adult: Secondary | ICD-10-CM

## 2020-06-11 DIAGNOSIS — E669 Obesity, unspecified: Secondary | ICD-10-CM

## 2020-06-11 DIAGNOSIS — E8881 Metabolic syndrome: Secondary | ICD-10-CM

## 2020-06-11 DIAGNOSIS — E559 Vitamin D deficiency, unspecified: Secondary | ICD-10-CM

## 2020-06-11 MED ORDER — VITAMIN D (ERGOCALCIFEROL) 1.25 MG (50000 UNIT) PO CAPS: 50000.0000 [IU] | ORAL_CAPSULE | ORAL | 0 refills | Status: DC

## 2020-06-16 NOTE — Progress Notes (Signed)
TeleHealth Visit:  Due to the COVID-19 pandemic, this visit was completed with telemedicine (audio/video) technology to reduce patient and provider exposure as well as to preserve personal protective equipment.   Aimee Gray has verbally consented to this TeleHealth visit. The patient is located at home, the provider is located at the [facility] office. The participants in this visit include the listed provider and patient. The visit was conducted today via MyChart Video.   Chief Complaint: OBESITY Aimee Gray is here to discuss her progress with her obesity treatment plan along with follow-up of her obesity related diagnoses. Aimee Gray is on the Category 2 Plan + 200 caloriesand states she is following her eating plan approximately 50% of the time. Aimee Gray states she is not exercising.  Today's visit was #: 13 Starting weight: 156 lbs Starting date: 10/28/2019  Interim History: Aimee Gray reported weight of 150 lbs today. 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 Aimee Gray has done some emotional eating due to son's medical update.  Subjective:   1. Vitamin D deficiency   Aimee Gray's last Vitamin D was 32.5. She is on prescription Vitamin D.  Aimee Gray denies nausea,vomiting, ore muscle weakness but notes fatigue.   2. Insulin resistance  Katelind has a diagnosis of insulin resistance based on her elevated fasting insulin level >5. She continues to work on diet and exercise to decrease her risk of diabetes. Last A1c was 5.2 and insulin 13.9. Aimee Gray has been trying to follow meal plan, but has done some emotional eating due to updates on sons metabolic condition.  Lab Results  Component Value Date   INSULIN 13.9 10/28/2019   Lab Results  Component Value Date   HGBA1C 5.2 10/28/2019     Assessment/Plan:   1. Vitamin D deficiency Low Vitamin D level contributes to fatigue and are associated with obesity, breast,  and colon cancer. She agrees to continue to take prescription Vitamin D @50 ,000 IU every week and will follow-up for routine testing of Vitamin D, at least 2-3 times per year to avoid over-replacement. We will refill Vitamin D 50,000 Unit weekly #4 no refill.  - Vitamin D, Ergocalciferol, (DRISDOL) 1.25 MG (50000 UNIT) CAPS capsule; Take 1 capsule (50,000 Units total) by mouth every 7 (seven) days.  Dispense: 4 capsule; Refill: 0  2. Insulin resistance Aimee Gray will continue to work on weight loss, exercise, and decreasing simple carbohydrates to help decrease the risk of diabetes. Carletta agreed to follow-up with Aimee Gray as directed to closely monitor her progress. We will follow up on emotional eating at next appointment. Aimee Gray will continue Cat 2 with no change to meal plan.  3. Class 1 obesity with serious comorbidity and body mass index (BMI) of 30.0 to 30.9 in adult, unspecified obesity type  Aimee Gray is currently in the action stage of change. As such, her goal is to continue with weight loss efforts. She has agreed to the Category 2 Plan + 200 calories..   Exercise goals: No exercise has been prescribed at this time.  Behavioral modification strategies: increasing lean protein intake, meal planning and cooking strategies, keeping healthy foods in the home and planning for success.  Aimee Gray has agreed to follow-up with our clinic in 2 weeks. She was informed of the importance of frequent follow-up visits to maximize her success with intensive lifestyle modifications for her multiple health conditions.    Objective:   VITALS: Per patient if applicable, see vitals. GENERAL: Alert  and in no acute distress. CARDIOPULMONARY: No increased WOB. Speaking in clear sentences.  PSYCH: Pleasant and cooperative. Speech normal rate and rhythm. Affect is appropriate. Insight and judgement are appropriate. Attention is focused, linear, and appropriate.  NEURO: Oriented as arrived to appointment on time  with no prompting.   Lab Results  Component Value Date   CREATININE 0.71 10/28/2019   BUN 7 10/28/2019   NA 139 10/28/2019   K 4.2 10/28/2019   CL 102 10/28/2019   CO2 26 10/28/2019   Lab Results  Component Value Date   ALT 17 10/28/2019   AST 16 10/28/2019   ALKPHOS 67 10/28/2019   BILITOT <0.2 10/28/2019   Lab Results  Component Value Date   HGBA1C 5.2 10/28/2019   Lab Results  Component Value Date   INSULIN 13.9 10/28/2019   Lab Results  Component Value Date   TSH 2.850 10/28/2019   Lab Results  Component Value Date   CHOL 167 10/28/2019   HDL 45 10/28/2019   LDLCALC 106 (H) 10/28/2019   TRIG 84 10/28/2019   CHOLHDL 3.4 04/17/2017   Lab Results  Component Value Date   WBC 7.0 10/28/2019   HGB 14.8 10/28/2019   HCT 43.7 10/28/2019   MCV 92 10/28/2019   PLT 319 10/28/2019   No results found for: IRON, TIBC, FERRITIN  Attestation Statements:   Reviewed by clinician on day of visit: allergies, medications, problem list, medical history, surgical history, family history, social history, and previous encounter notes.    I, Delorse Limber, am acting as transcriptionist for Reuben Likes, MD.  I have reviewed the above documentation for accuracy and completeness, and I agree with the above. - Katherina Mires, MD

## 2020-06-22 ENCOUNTER — Ambulatory Visit: Payer: BC Managed Care – PPO | Admitting: Psychology

## 2020-06-25 ENCOUNTER — Encounter (INDEPENDENT_AMBULATORY_CARE_PROVIDER_SITE_OTHER): Payer: Self-pay | Admitting: Family Medicine

## 2020-06-25 NOTE — Telephone Encounter (Signed)
Please advise 

## 2020-06-29 ENCOUNTER — Encounter (INDEPENDENT_AMBULATORY_CARE_PROVIDER_SITE_OTHER): Payer: Self-pay

## 2020-06-29 NOTE — Telephone Encounter (Signed)
FYI

## 2020-06-30 ENCOUNTER — Telehealth (INDEPENDENT_AMBULATORY_CARE_PROVIDER_SITE_OTHER): Payer: BC Managed Care – PPO | Admitting: Family Medicine

## 2020-06-30 ENCOUNTER — Encounter (INDEPENDENT_AMBULATORY_CARE_PROVIDER_SITE_OTHER): Payer: Self-pay | Admitting: Family Medicine

## 2020-06-30 ENCOUNTER — Other Ambulatory Visit: Payer: Self-pay

## 2020-06-30 DIAGNOSIS — E669 Obesity, unspecified: Secondary | ICD-10-CM | POA: Diagnosis not present

## 2020-06-30 DIAGNOSIS — Z6830 Body mass index (BMI) 30.0-30.9, adult: Secondary | ICD-10-CM | POA: Diagnosis not present

## 2020-06-30 DIAGNOSIS — F418 Other specified anxiety disorders: Secondary | ICD-10-CM

## 2020-06-30 MED ORDER — VIIBRYD STARTER PACK 10 & 20 MG PO KIT: 10.0000 mg | PACK | Freq: Every day | ORAL | 0 refills | Status: DC

## 2020-06-30 MED ORDER — BUPROPION HCL ER (SR) 100 MG PO TB12: 100.0000 mg | ORAL_TABLET | Freq: Every day | ORAL | 0 refills | Status: DC

## 2020-06-30 MED ORDER — BUSPIRONE HCL 7.5 MG PO TABS
7.5000 mg | ORAL_TABLET | Freq: Three times a day (TID) | ORAL | 0 refills | Status: DC | PRN
Start: 2020-06-30 — End: 2020-11-25

## 2020-07-01 ENCOUNTER — Encounter (INDEPENDENT_AMBULATORY_CARE_PROVIDER_SITE_OTHER): Payer: Self-pay

## 2020-07-01 NOTE — Telephone Encounter (Signed)
Please advise 

## 2020-07-02 NOTE — Progress Notes (Signed)
TeleHealth Visit:  Due to the COVID-19 pandemic, this visit was completed with telemedicine (audio/video) technology to reduce patient and provider exposure as well as to preserve personal protective equipment.   [name] has verbally consented to this TeleHealth visit. The patient is located at home, the provider is located at the [facility] office. The participants in this visit include the listed provider and patient. The visit was conducted today via video.   Chief Complaint: OBESITY Aimee Gray is here to discuss her progress with her obesity treatment plan along with follow-up of her obesity related diagnoses. Aimee Gray is on keeping a food journal and adhering to recommended goals of 1400 calories and 85 g protein and states she is following her eating plan approximately 60% of the time. Aimee Gray states she is doing 0 minutes 0 times per week.  Today's visit was #: 14 Starting weight: 156 lbs Starting date: 10/28/2019  Interim History: Aimee Gray is feeling a bit of a funk. She reports being short tempered,s ad, forgetful, and feels she is on edge constantly. She is having a hard time getting herself back together. She is doing well on following the plan for breakfast and lunch, but struggles with dinner. She is feeling depleted at the end of the day, especially with dogs cancer diagnosis.  Subjective:   1. Depression with anxiety Aimee Gray is on Trintellix 20 mg with significant symptoms even on max dose. Pt denies suicidal or homicidal ideations. She was previously on Buspar and Wellbutrin but stopped due to resolution of symptoms.   Assessment/Plan:   1. Depression with anxiety Start Buspar 7.5 mg by mouth TID PRN anxiety. Start Wellbutrin 100 mg by mouth every morning. Decrease Trintellix to 10 mg daily for 1 week, then discontinue. Start Viibryd 10 mg, as per below.  - Vilazodone HCl (VIIBRYD STARTER PACK) 10 & 20 MG KIT; Take 10 mg by mouth daily. $RemoveBefo'10mg'JVkAnKWXgwS$  by mouth daily x 7  days then increase to $RemoveBef'20mg'XhlQtMfVpJ$  daily x 23 days  Dispense: 1 kit; Refill: 0  - buPROPion (WELLBUTRIN SR) 100 MG 12 hr tablet; Take 1 tablet (100 mg total) by mouth daily.  Dispense: 30 tablet; Refill: 0  - busPIRone (BUSPAR) 7.5 MG tablet; Take 1 tablet (7.5 mg total) by mouth 3 (three) times daily as needed (anxiety).  Dispense: 90 tablet; Refill: 0  2. Class 1 obesity with serious comorbidity and body mass index (BMI) of 30.0 to 30.9 in adult, unspecified obesity type Aimee Gray is currently in the action stage of change. As such, her goal is to continue with weight loss efforts. She has agreed to keeping a food journal and adhering to recommended goals of 1400 calories and 85+ g protein.   Exercise goals: As is  Behavioral modification strategies: increasing lean protein intake, no skipping meals, emotional eating strategies and planning for success.  Aimee Gray has agreed to follow-up with our clinic in 2 weeks. She was informed of the importance of frequent follow-up visits to maximize her success with intensive lifestyle modifications for her multiple health conditions.  Objective:   VITALS: Per patient if applicable, see vitals. GENERAL: Alert and in no acute distress. CARDIOPULMONARY: No increased WOB. Speaking in clear sentences.  PSYCH: Pleasant and cooperative. Speech normal rate and rhythm. Affect is appropriate. Insight and judgement are appropriate. Attention is focused, linear, and appropriate.  NEURO: Oriented as arrived to appointment on time with no prompting.   Lab Results  Component Value Date   CREATININE 0.71 10/28/2019  BUN 7 10/28/2019   NA 139 10/28/2019   K 4.2 10/28/2019   CL 102 10/28/2019   CO2 26 10/28/2019   Lab Results  Component Value Date   ALT 17 10/28/2019   AST 16 10/28/2019   ALKPHOS 67 10/28/2019   BILITOT <0.2 10/28/2019   Lab Results  Component Value Date   HGBA1C 5.2 10/28/2019   Lab Results  Component Value Date   INSULIN 13.9 10/28/2019    Lab Results  Component Value Date   TSH 2.850 10/28/2019   Lab Results  Component Value Date   CHOL 167 10/28/2019   HDL 45 10/28/2019   LDLCALC 106 (H) 10/28/2019   TRIG 84 10/28/2019   CHOLHDL 3.4 04/17/2017   Lab Results  Component Value Date   WBC 7.0 10/28/2019   HGB 14.8 10/28/2019   HCT 43.7 10/28/2019   MCV 92 10/28/2019   PLT 319 10/28/2019   No results found for: IRON, TIBC, FERRITIN  Attestation Statements:   Reviewed by clinician on day of visit: allergies, medications, problem list, medical history, surgical history, family history, social history, and previous encounter notes.  Coral Ceo, am acting as transcriptionist for Coralie Common, MD.   I have reviewed the above documentation for accuracy and completeness, and I agree with the above. - Jinny Blossom, MD

## 2020-07-03 ENCOUNTER — Encounter (INDEPENDENT_AMBULATORY_CARE_PROVIDER_SITE_OTHER): Payer: Self-pay | Admitting: Family Medicine

## 2020-07-06 ENCOUNTER — Ambulatory Visit (INDEPENDENT_AMBULATORY_CARE_PROVIDER_SITE_OTHER): Payer: BC Managed Care – PPO | Admitting: Psychology

## 2020-07-06 DIAGNOSIS — F411 Generalized anxiety disorder: Secondary | ICD-10-CM | POA: Diagnosis not present

## 2020-07-06 NOTE — Telephone Encounter (Signed)
Please advise 

## 2020-07-07 ENCOUNTER — Other Ambulatory Visit (INDEPENDENT_AMBULATORY_CARE_PROVIDER_SITE_OTHER): Payer: Self-pay | Admitting: Family Medicine

## 2020-07-07 ENCOUNTER — Encounter (INDEPENDENT_AMBULATORY_CARE_PROVIDER_SITE_OTHER): Payer: Self-pay | Admitting: Family Medicine

## 2020-07-07 DIAGNOSIS — F3289 Other specified depressive episodes: Secondary | ICD-10-CM

## 2020-07-07 MED ORDER — VORTIOXETINE HBR 20 MG PO TABS: 20.0000 mg | ORAL_TABLET | Freq: Every day | ORAL | 0 refills | Status: DC

## 2020-07-07 NOTE — Telephone Encounter (Signed)
Please advise 

## 2020-07-09 ENCOUNTER — Ambulatory Visit (INDEPENDENT_AMBULATORY_CARE_PROVIDER_SITE_OTHER): Payer: Self-pay | Admitting: Family Medicine

## 2020-07-16 ENCOUNTER — Encounter (INDEPENDENT_AMBULATORY_CARE_PROVIDER_SITE_OTHER): Payer: Self-pay | Admitting: Bariatrics

## 2020-07-16 ENCOUNTER — Other Ambulatory Visit: Payer: Self-pay

## 2020-07-16 ENCOUNTER — Ambulatory Visit (INDEPENDENT_AMBULATORY_CARE_PROVIDER_SITE_OTHER): Payer: BC Managed Care – PPO | Admitting: Bariatrics

## 2020-07-16 VITALS — BP 128/84 | HR 74 | Temp 98.1°F | Ht 60.0 in | Wt 153.0 lb

## 2020-07-16 DIAGNOSIS — E559 Vitamin D deficiency, unspecified: Secondary | ICD-10-CM

## 2020-07-16 DIAGNOSIS — Z9189 Other specified personal risk factors, not elsewhere classified: Secondary | ICD-10-CM | POA: Diagnosis not present

## 2020-07-16 DIAGNOSIS — Z6830 Body mass index (BMI) 30.0-30.9, adult: Secondary | ICD-10-CM | POA: Diagnosis not present

## 2020-07-16 DIAGNOSIS — E669 Obesity, unspecified: Secondary | ICD-10-CM | POA: Diagnosis not present

## 2020-07-16 DIAGNOSIS — F418 Other specified anxiety disorders: Secondary | ICD-10-CM

## 2020-07-16 MED ORDER — BUPROPION HCL ER (SR) 100 MG PO TB12
100.0000 mg | ORAL_TABLET | Freq: Every day | ORAL | 0 refills | Status: DC
Start: 2020-07-16 — End: 2020-08-10

## 2020-07-16 MED ORDER — VITAMIN D (ERGOCALCIFEROL) 1.25 MG (50000 UNIT) PO CAPS: 50000.0000 [IU] | ORAL_CAPSULE | ORAL | 0 refills | Status: DC

## 2020-07-20 ENCOUNTER — Ambulatory Visit (INDEPENDENT_AMBULATORY_CARE_PROVIDER_SITE_OTHER): Payer: BC Managed Care – PPO | Admitting: Psychology

## 2020-07-20 ENCOUNTER — Encounter (INDEPENDENT_AMBULATORY_CARE_PROVIDER_SITE_OTHER): Payer: Self-pay | Admitting: Bariatrics

## 2020-07-20 DIAGNOSIS — F411 Generalized anxiety disorder: Secondary | ICD-10-CM

## 2020-07-20 NOTE — Progress Notes (Signed)
Chief Complaint:   OBESITY Aimee Gray is here to discuss her progress with her obesity treatment plan along with follow-up of her obesity related diagnoses. Aimee Gray is on the Category 2 Plan and states she is following her eating plan approximately 60% of the time. Aimee Gray states she is doing 0 minutes 0 times per week.  Today's visit was #: 15 Starting weight: 156 lbs Starting date: 10/28/2019 Today's weight: 153 lbs Today's date: 07/16/2020 Total lbs lost to date: 3 lbs Total lbs lost since last in-office visit: 3 lbs  Interim History: Aimee Gray is down 3 lbs since her last visit.   Subjective:   1. Vitamin D deficiency Aimee Gray's Vitamin D level was 32.5 on 10/28/2019. She is currently taking prescription vitamin D 50,000 IU each week. She denies nausea, vomiting or muscle weakness.   Ref. Range 10/28/2019 11:46  Vitamin D, 25-Hydroxy Latest Ref Range: 30.0 - 100.0 ng/mL 32.5   2. Depression with anxiety Aimee Gray reports improvement in depression but is still experiencing some anxiety.  3. At risk for osteoporosis Aimee Gray is at higher risk of osteopenia and osteoporosis due to Vitamin D deficiency.   Assessment/Plan:   1. Vitamin D deficiency Low Vitamin D level contributes to fatigue and are associated with obesity, breast, and colon cancer. She agrees to continue to take prescription Vitamin D @50 ,000 IU every week and will follow-up for routine testing of Vitamin D, at least 2-3 times per year to avoid over-replacement.  - Vitamin D, Ergocalciferol, (DRISDOL) 1.25 MG (50000 UNIT) CAPS capsule; Take 1 capsule (50,000 Units total) by mouth every 7 (seven) days.  Dispense: 4 capsule; Refill: 0  2. Depression with anxiety Behavior modification techniques were discussed today to help Aimee Gray deal with her emotional/non-hunger eating behaviors.  Orders and follow up as documented in patient record. Behavior modification techniques were discussed today to help Aimee Gray deal with her  anxiety.  Orders and follow up as documented in patient record.   - buPROPion (WELLBUTRIN SR) 100 MG 12 hr tablet; Take 1 tablet (100 mg total) by mouth daily.  Dispense: 30 tablet; Refill: 0  3. At risk for osteoporosis Aimee Gray was given approximately 15 minutes of osteoporosis prevention counseling today. Aimee Gray is at risk for osteopenia and osteoporosis due to her Vitamin D deficiency. She was encouraged to take her Vitamin D and follow her higher calcium diet and increase strengthening exercise to help strengthen her bones and decrease her risk of osteopenia and osteoporosis.  Repetitive spaced learning was employed today to elicit superior memory formation and behavioral change.  4. Class 1 obesity with serious comorbidity and body mass index (BMI) of 30.0 to 30.9 in adult, unspecified obesity type Aimee Gray is currently in the action stage of change. As such, her goal is to continue with weight loss efforts. She has agreed to the Category 2 Plan.   Exercise goals: As is  Behavioral modification strategies: increasing lean protein intake, decreasing simple carbohydrates, increasing vegetables, increasing water intake, decreasing eating out, no skipping meals, meal planning and cooking strategies, keeping healthy foods in the home and planning for success.  Aimee Gray has agreed to follow-up with our clinic in 3-4 weeks wit Dr. [name]. She was informed of the importance of frequent follow-up visits to maximize her success with intensive lifestyle modifications for her multiple health conditions.   Objective:   Blood pressure 128/84, pulse 74, temperature 98.1 F (36.7 C), height 5' (1.524 m), weight 153 lb (69.4 kg), SpO2 98 %. Body mass  index is 29.88 kg/m.  General: Cooperative, alert, well developed, in no acute distress. HEENT: Conjunctivae and lids unremarkable. Cardiovascular: Regular rhythm.  Lungs: Normal work of breathing. Neurologic: No focal deficits.   Lab Results   Component Value Date   CREATININE 0.71 10/28/2019   BUN 7 10/28/2019   NA 139 10/28/2019   K 4.2 10/28/2019   CL 102 10/28/2019   CO2 26 10/28/2019   Lab Results  Component Value Date   ALT 17 10/28/2019   AST 16 10/28/2019   ALKPHOS 67 10/28/2019   BILITOT <0.2 10/28/2019   Lab Results  Component Value Date   HGBA1C 5.2 10/28/2019   Lab Results  Component Value Date   INSULIN 13.9 10/28/2019   Lab Results  Component Value Date   TSH 2.850 10/28/2019   Lab Results  Component Value Date   CHOL 167 10/28/2019   HDL 45 10/28/2019   LDLCALC 106 (H) 10/28/2019   TRIG 84 10/28/2019   CHOLHDL 3.4 04/17/2017   Lab Results  Component Value Date   WBC 7.0 10/28/2019   HGB 14.8 10/28/2019   HCT 43.7 10/28/2019   MCV 92 10/28/2019   PLT 319 10/28/2019   No results found for: IRON, TIBC, FERRITIN  Attestation Statements:   Reviewed by clinician on day of visit: allergies, medications, problem list, medical history, surgical history, family history, social history, and previous encounter notes.  Edmund Hilda, am acting as Energy manager for [employer], DO.  I have reviewed the above documentation for accuracy and completeness, and I agree with the above. Corinna Capra, DO

## 2020-07-22 ENCOUNTER — Encounter (INDEPENDENT_AMBULATORY_CARE_PROVIDER_SITE_OTHER): Payer: Self-pay

## 2020-07-22 ENCOUNTER — Other Ambulatory Visit (INDEPENDENT_AMBULATORY_CARE_PROVIDER_SITE_OTHER): Payer: Self-pay | Admitting: Family Medicine

## 2020-07-22 DIAGNOSIS — F418 Other specified anxiety disorders: Secondary | ICD-10-CM

## 2020-07-22 NOTE — Telephone Encounter (Signed)
Message sent to pt-CAS 

## 2020-07-24 ENCOUNTER — Encounter (INDEPENDENT_AMBULATORY_CARE_PROVIDER_SITE_OTHER): Payer: Self-pay | Admitting: Family Medicine

## 2020-07-28 ENCOUNTER — Encounter (INDEPENDENT_AMBULATORY_CARE_PROVIDER_SITE_OTHER): Payer: Self-pay

## 2020-07-28 ENCOUNTER — Other Ambulatory Visit (INDEPENDENT_AMBULATORY_CARE_PROVIDER_SITE_OTHER): Payer: Self-pay | Admitting: Family Medicine

## 2020-07-28 DIAGNOSIS — F3289 Other specified depressive episodes: Secondary | ICD-10-CM

## 2020-07-28 NOTE — Telephone Encounter (Signed)
Please advise 

## 2020-07-28 NOTE — Telephone Encounter (Signed)
Message sent to pt-CAS 

## 2020-08-03 ENCOUNTER — Ambulatory Visit: Payer: BC Managed Care – PPO | Admitting: Psychology

## 2020-08-04 ENCOUNTER — Ambulatory Visit: Payer: BC Managed Care – PPO | Admitting: Psychology

## 2020-08-05 ENCOUNTER — Encounter (INDEPENDENT_AMBULATORY_CARE_PROVIDER_SITE_OTHER): Payer: Self-pay | Admitting: Family Medicine

## 2020-08-05 ENCOUNTER — Ambulatory Visit (INDEPENDENT_AMBULATORY_CARE_PROVIDER_SITE_OTHER): Payer: BC Managed Care – PPO | Admitting: Psychology

## 2020-08-05 DIAGNOSIS — F411 Generalized anxiety disorder: Secondary | ICD-10-CM | POA: Diagnosis not present

## 2020-08-06 ENCOUNTER — Other Ambulatory Visit (INDEPENDENT_AMBULATORY_CARE_PROVIDER_SITE_OTHER): Payer: Self-pay | Admitting: Bariatrics

## 2020-08-06 DIAGNOSIS — F3289 Other specified depressive episodes: Secondary | ICD-10-CM

## 2020-08-06 MED ORDER — VORTIOXETINE HBR 20 MG PO TABS
20.0000 mg | ORAL_TABLET | Freq: Every day | ORAL | 0 refills | Status: DC
Start: 2020-08-06 — End: 2020-09-01

## 2020-08-06 NOTE — Telephone Encounter (Signed)
Last seen by Dr. Brown. 

## 2020-08-06 NOTE — Telephone Encounter (Signed)
Refill request

## 2020-08-10 ENCOUNTER — Other Ambulatory Visit: Payer: Self-pay

## 2020-08-10 ENCOUNTER — Encounter (INDEPENDENT_AMBULATORY_CARE_PROVIDER_SITE_OTHER): Payer: Self-pay | Admitting: Family Medicine

## 2020-08-10 ENCOUNTER — Ambulatory Visit (INDEPENDENT_AMBULATORY_CARE_PROVIDER_SITE_OTHER): Payer: BC Managed Care – PPO | Admitting: Family Medicine

## 2020-08-10 VITALS — BP 124/79 | HR 76 | Temp 97.9°F | Ht 60.0 in | Wt 155.0 lb

## 2020-08-10 DIAGNOSIS — Z6830 Body mass index (BMI) 30.0-30.9, adult: Secondary | ICD-10-CM

## 2020-08-10 DIAGNOSIS — R632 Polyphagia: Secondary | ICD-10-CM

## 2020-08-10 DIAGNOSIS — E669 Obesity, unspecified: Secondary | ICD-10-CM

## 2020-08-10 DIAGNOSIS — Z9189 Other specified personal risk factors, not elsewhere classified: Secondary | ICD-10-CM

## 2020-08-10 DIAGNOSIS — F3289 Other specified depressive episodes: Secondary | ICD-10-CM | POA: Diagnosis not present

## 2020-08-10 MED ORDER — OZEMPIC (0.25 OR 0.5 MG/DOSE) 2 MG/1.5ML ~~LOC~~ SOPN: 0.2500 mg | PEN_INJECTOR | SUBCUTANEOUS | 0 refills | Status: DC

## 2020-08-10 MED ORDER — BUPROPION HCL ER (SR) 150 MG PO TB12: 150.0000 mg | ORAL_TABLET | Freq: Every day | ORAL | 0 refills | Status: DC

## 2020-08-12 NOTE — Progress Notes (Signed)
Chief Complaint:   OBESITY Aimee Gray is here to discuss her progress with her obesity treatment plan along with follow-up of her obesity related diagnoses. Aimee Gray is on the Category 2 Plan and states she is following her eating plan approximately 80% of the time. Aimee Gray states she is walking the dog 30 minutes 1 times per week.  Today's visit was #: 16 Starting weight: 156 lbs Starting date: 10/28/2019 Today's weight: 155 lbs Today's date: 08/10/2020 Total lbs lost to date: 1 lb Total lbs lost since last in-office visit: 0  Interim History: Aimee Gray has been dealing with increased stress at work and so her mood has been feeling more depleted. She is starting to feel more even keel. She has been meal prepping for 3 weeks and feels disappointed.she is eating chocolate from treat bags she makes for her kids at school.  Subjective:   1. Polyphagia Aimee Gray's last A1c was 5.2 and insulin level 13.9. She has significant lack of control of indulgent eating choices.   2. Other depression, with emotional eating Aimee Gray denies suicidal or homicidal ideations. She is on Wellbutrin 100 mg daily, Trintellix 20 mg, and Buspar with some improvement in symptoms.  3. At risk for side effect of medication Aimee Gray is at risk for side effect of medication due to increasing Wellbutrin and starting Ozempic.  Assessment/Plan:   1. Polyphagia Intensive lifestyle modifications are the first line treatment for this issue. We discussed several lifestyle modifications today and she will continue to work on diet, exercise and weight loss efforts. Orders and follow up as documented in patient record. Start Ozempic 0.25 mg, as per below.  Counseling . Polyphagia is excessive hunger. . Causes can include: low blood sugars, hypERthyroidism, PMS, lack of sleep, stress, insulin resistance, diabetes, certain medications, and diets that are deficient in protein and fiber.   - Semaglutide,0.25 or 0.5MG /DOS,  (OZEMPIC, 0.25 OR 0.5 MG/DOSE,) 2 MG/1.5ML SOPN; Inject 0.25 mg into the skin once a week.  Dispense: 1.5 mL; Refill: 0  2. Other depression, with emotional eating Behavior modification techniques were discussed today to help Aimee Gray deal with her emotional/non-hunger eating behaviors.  Orders and follow up as documented in patient record. Increase Wellbutrin to 150 mg, as per below.  - buPROPion (WELLBUTRIN SR) 150 MG 12 hr tablet; Take 1 tablet (150 mg total) by mouth daily.  Dispense: 30 tablet; Refill: 0  3. At risk for side effect of medication Aimee Gray was given approximately 15 minutes of drug side effect counseling today.  We discussed side effect possibility and risk versus benefits. Aimee Gray agreed to the medication and will contact this office if these side effects are intolerable.  Repetitive spaced learning was employed today to elicit superior memory formation and behavioral change.  4. Class 1 obesity with serious comorbidity and body mass index (BMI) of 30.0 to 30.9 in adult, unspecified obesity type Aimee Gray is currently in the action stage of change. As such, her goal is to continue with weight loss efforts. She has agreed to the Category 2 Plan.   Exercise goals: As is  Behavioral modification strategies: increasing lean protein intake, meal planning and cooking strategies, keeping healthy foods in the home and planning for success.  Aimee Gray has agreed to follow-up with our clinic in 2 weeks. She was informed of the importance of frequent follow-up visits to maximize her success with intensive lifestyle modifications for her multiple health conditions.   Objective:   Blood pressure 124/79, pulse 76, temperature 97.9 F (  36.6 C), temperature source Oral, height 5' (1.524 m), weight 155 lb (70.3 kg), SpO2 97 %. Body mass index is 30.27 kg/m.  General: Cooperative, alert, well developed, in no acute distress. HEENT: Conjunctivae and lids unremarkable. Cardiovascular:  Regular rhythm.  Lungs: Normal work of breathing. Neurologic: No focal deficits.   Lab Results  Component Value Date   CREATININE 0.71 10/28/2019   BUN 7 10/28/2019   NA 139 10/28/2019   K 4.2 10/28/2019   CL 102 10/28/2019   CO2 26 10/28/2019   Lab Results  Component Value Date   ALT 17 10/28/2019   AST 16 10/28/2019   ALKPHOS 67 10/28/2019   BILITOT <0.2 10/28/2019   Lab Results  Component Value Date   HGBA1C 5.2 10/28/2019   Lab Results  Component Value Date   INSULIN 13.9 10/28/2019   Lab Results  Component Value Date   TSH 2.850 10/28/2019   Lab Results  Component Value Date   CHOL 167 10/28/2019   HDL 45 10/28/2019   LDLCALC 106 (H) 10/28/2019   TRIG 84 10/28/2019   CHOLHDL 3.4 04/17/2017   Lab Results  Component Value Date   WBC 7.0 10/28/2019   HGB 14.8 10/28/2019   HCT 43.7 10/28/2019   MCV 92 10/28/2019   PLT 319 10/28/2019    Attestation Statements:   Reviewed by clinician on day of visit: allergies, medications, problem list, medical history, surgical history, family history, social history, and previous encounter notes.  Edmund Hilda, am acting as transcriptionist for Reuben Likes, MD.   I have reviewed the above documentation for accuracy and completeness, and I agree with the above. - Katherina Mires, MD

## 2020-08-17 ENCOUNTER — Ambulatory Visit (INDEPENDENT_AMBULATORY_CARE_PROVIDER_SITE_OTHER): Payer: BC Managed Care – PPO | Admitting: Psychology

## 2020-08-17 DIAGNOSIS — F411 Generalized anxiety disorder: Secondary | ICD-10-CM | POA: Diagnosis not present

## 2020-08-26 ENCOUNTER — Encounter (INDEPENDENT_AMBULATORY_CARE_PROVIDER_SITE_OTHER): Payer: Self-pay | Admitting: Family Medicine

## 2020-08-31 ENCOUNTER — Ambulatory Visit (INDEPENDENT_AMBULATORY_CARE_PROVIDER_SITE_OTHER): Payer: BC Managed Care – PPO | Admitting: Family Medicine

## 2020-08-31 ENCOUNTER — Other Ambulatory Visit: Payer: Self-pay

## 2020-08-31 ENCOUNTER — Ambulatory Visit: Payer: BC Managed Care – PPO | Admitting: Psychology

## 2020-08-31 ENCOUNTER — Encounter (INDEPENDENT_AMBULATORY_CARE_PROVIDER_SITE_OTHER): Payer: Self-pay | Admitting: Family Medicine

## 2020-08-31 VITALS — BP 126/85 | HR 83 | Temp 98.2°F | Ht 60.0 in | Wt 150.0 lb

## 2020-08-31 DIAGNOSIS — G43101 Migraine with aura, not intractable, with status migrainosus: Secondary | ICD-10-CM | POA: Diagnosis not present

## 2020-08-31 DIAGNOSIS — Z6830 Body mass index (BMI) 30.0-30.9, adult: Secondary | ICD-10-CM | POA: Diagnosis not present

## 2020-08-31 DIAGNOSIS — E669 Obesity, unspecified: Secondary | ICD-10-CM | POA: Diagnosis not present

## 2020-08-31 DIAGNOSIS — Z9189 Other specified personal risk factors, not elsewhere classified: Secondary | ICD-10-CM | POA: Diagnosis not present

## 2020-08-31 DIAGNOSIS — F3289 Other specified depressive episodes: Secondary | ICD-10-CM | POA: Diagnosis not present

## 2020-08-31 DIAGNOSIS — F411 Generalized anxiety disorder: Secondary | ICD-10-CM

## 2020-09-01 ENCOUNTER — Ambulatory Visit: Payer: BC Managed Care – PPO | Admitting: Psychology

## 2020-09-01 ENCOUNTER — Other Ambulatory Visit (INDEPENDENT_AMBULATORY_CARE_PROVIDER_SITE_OTHER): Payer: Self-pay | Admitting: Family Medicine

## 2020-09-01 DIAGNOSIS — F3289 Other specified depressive episodes: Secondary | ICD-10-CM

## 2020-09-01 MED ORDER — BUPROPION HCL ER (SR) 150 MG PO TB12: 150.0000 mg | ORAL_TABLET | Freq: Every day | ORAL | 0 refills | Status: DC

## 2020-09-01 MED ORDER — VORTIOXETINE HBR 20 MG PO TABS: 20.0000 mg | ORAL_TABLET | Freq: Every day | ORAL | 0 refills | Status: DC

## 2020-09-01 MED ORDER — RIZATRIPTAN BENZOATE 10 MG PO TBDP: ORAL_TABLET | ORAL | 6 refills | Status: DC

## 2020-09-03 NOTE — Progress Notes (Signed)
Chief Complaint:   OBESITY Aimee Gray is here to discuss her progress with her obesity treatment plan along with follow-up of her obesity related diagnoses. Aimee Gray is on the Category 2 Plan and states she is following her eating plan approximately 60% of the time. Aimee Gray states she is walking.  Today's visit was #: 17 Starting weight: 156 lbs Starting date: 10/28/2019 Today's weight: 150 lbs Today's date: 08/31/2020 Total lbs lost to date: 6 Total lbs lost since last in-office visit: 5  Interim History: Aimee Gray started Ozempic last visit. She self discontinued on 08/21/2020 due to recurrent N/V (NBNB). She is still a little nauseous. No further emesis since last week. She has been eating bland diet due to that (bananas, crackers, etc). She is motivated to restart the category 2 plan. She is going on vacation (camping in [place]) next week and already pre-planning meals and snacks.  Subjective:   1. Other depression, with emotional eating Aimee Gray denies suicidal or homicidal ideations. She is on Trintellix and Wellbutrin. Her symptoms are better managed.  2. Migraine with aura and with status migrainosus, not intractable Aimee Gray reports intermittent migraines that are precipitated by hormone change vomiting/dehydration. She is normally well controlled on [name].  3. At risk for depression Aimee Gray is at elevated risk of depression due to stress at work for end of the year.  Assessment/Plan:   1. Other depression, with emotional eating Behavior modification techniques were discussed today to help Aimee Gray deal with her emotional/non-hunger eating behaviors.  Orders and follow up as documented in patient record.  - buPROPion (WELLBUTRIN SR) 150 MG 12 hr tablet; Take 1 tablet (150 mg total) by mouth daily.  Dispense: 30 tablet; Refill: 0 - vortioxetine HBr (TRINTELLIX) 20 MG TABS tablet; Take 1 tablet (20 mg total) by mouth daily.  Dispense: 30 tablet; Refill: 0  2. Migraine with aura  and with status migrainosus, not intractable Refill [name] 10 mg, as prescribed below.  - rizatriptan ([name]-MLT) 10 MG disintegrating tablet; DISSOLVE 1 TAB BY MOUTH AS NEEDED FOR MIGRAINE. MAY REPEAT IN 2 HOURS IF NEEDED  Dispense: 10 tablet; Refill: 6  3. At risk for depression Aimee Gray was given approximately 15 minutes of depression risk counseling today. She has risk factors for depression including stress at work for end of year. We discussed the importance of a healthy work life balance, a healthy relationship with food and a good support system.  Repetitive spaced learning was employed today to elicit superior memory formation and behavioral change.  4. Class 1 obesity with serious comorbidity and body mass index (BMI) of 30.0 to 30.9 in adult, unspecified obesity type Aimee Gray is currently in the action stage of change. As such, her goal is to continue with weight loss efforts. She has agreed to the Category 2 Plan.   Exercise goals: As is  Behavioral modification strategies: increasing lean protein intake, no skipping meals and travel eating strategies.  Aimee Gray has agreed to follow-up with our clinic in 3 weeks. She was informed of the importance of frequent follow-up visits to maximize her success with intensive lifestyle modifications for her multiple health conditions.   Objective:   Blood pressure 126/85, pulse 83, temperature 98.2 F (36.8 C), height 5' (1.524 m), weight 150 lb (68 kg), SpO2 97 %. Body mass index is 29.29 kg/m.  General: Cooperative, alert, well developed, in no acute distress. HEENT: Conjunctivae and lids unremarkable. Cardiovascular: Regular rhythm.  Lungs: Normal work of breathing. Neurologic: No focal deficits.  Lab Results  Component Value Date   CREATININE 0.71 10/28/2019   BUN 7 10/28/2019   NA 139 10/28/2019   K 4.2 10/28/2019   CL 102 10/28/2019   CO2 26 10/28/2019   Lab Results  Component Value Date   ALT 17 10/28/2019   AST 16  10/28/2019   ALKPHOS 67 10/28/2019   BILITOT <0.2 10/28/2019   Lab Results  Component Value Date   HGBA1C 5.2 10/28/2019   Lab Results  Component Value Date   INSULIN 13.9 10/28/2019   Lab Results  Component Value Date   TSH 2.850 10/28/2019   Lab Results  Component Value Date   CHOL 167 10/28/2019   HDL 45 10/28/2019   LDLCALC 106 (H) 10/28/2019   TRIG 84 10/28/2019   CHOLHDL 3.4 04/17/2017   Lab Results  Component Value Date   WBC 7.0 10/28/2019   HGB 14.8 10/28/2019   HCT 43.7 10/28/2019   MCV 92 10/28/2019   PLT 319 10/28/2019    Attestation Statements:   Reviewed by clinician on day of visit: allergies, medications, problem list, medical history, surgical history, family history, social history, and previous encounter notes.  Edmund Hilda, am acting as transcriptionist for Reuben Likes, MD.   I have reviewed the above documentation for accuracy and completeness, and I agree with the above. - Katherina Mires, MD

## 2020-09-14 ENCOUNTER — Ambulatory Visit (INDEPENDENT_AMBULATORY_CARE_PROVIDER_SITE_OTHER): Payer: BC Managed Care – PPO | Admitting: Psychology

## 2020-09-14 DIAGNOSIS — F411 Generalized anxiety disorder: Secondary | ICD-10-CM

## 2020-09-27 ENCOUNTER — Encounter (INDEPENDENT_AMBULATORY_CARE_PROVIDER_SITE_OTHER): Payer: Self-pay

## 2020-09-28 ENCOUNTER — Ambulatory Visit (INDEPENDENT_AMBULATORY_CARE_PROVIDER_SITE_OTHER): Payer: BC Managed Care – PPO | Admitting: Family Medicine

## 2020-09-28 ENCOUNTER — Ambulatory Visit: Payer: BC Managed Care – PPO | Admitting: Psychology

## 2020-09-30 ENCOUNTER — Other Ambulatory Visit (INDEPENDENT_AMBULATORY_CARE_PROVIDER_SITE_OTHER): Payer: Self-pay | Admitting: Family Medicine

## 2020-09-30 DIAGNOSIS — F3289 Other specified depressive episodes: Secondary | ICD-10-CM

## 2020-10-10 ENCOUNTER — Other Ambulatory Visit (INDEPENDENT_AMBULATORY_CARE_PROVIDER_SITE_OTHER): Payer: Self-pay | Admitting: Family Medicine

## 2020-10-10 DIAGNOSIS — F3289 Other specified depressive episodes: Secondary | ICD-10-CM

## 2020-10-12 ENCOUNTER — Ambulatory Visit: Payer: BC Managed Care – PPO | Admitting: Psychology

## 2020-10-26 ENCOUNTER — Ambulatory Visit (INDEPENDENT_AMBULATORY_CARE_PROVIDER_SITE_OTHER): Payer: BC Managed Care – PPO | Admitting: Psychology

## 2020-10-26 DIAGNOSIS — F411 Generalized anxiety disorder: Secondary | ICD-10-CM | POA: Diagnosis not present

## 2020-11-04 ENCOUNTER — Other Ambulatory Visit (INDEPENDENT_AMBULATORY_CARE_PROVIDER_SITE_OTHER): Payer: Self-pay | Admitting: Family Medicine

## 2020-11-04 DIAGNOSIS — F3289 Other specified depressive episodes: Secondary | ICD-10-CM

## 2020-11-05 ENCOUNTER — Encounter (INDEPENDENT_AMBULATORY_CARE_PROVIDER_SITE_OTHER): Payer: Self-pay | Admitting: Family Medicine

## 2020-11-05 MED ORDER — VORTIOXETINE HBR 20 MG PO TABS: 20.0000 mg | ORAL_TABLET | Freq: Every day | ORAL | 0 refills | Status: DC

## 2020-11-05 MED ORDER — BUPROPION HCL ER (SR) 150 MG PO TB12: 150.0000 mg | ORAL_TABLET | Freq: Every day | ORAL | 0 refills | Status: DC

## 2020-11-05 NOTE — Telephone Encounter (Signed)
DR Ukleja 

## 2020-11-09 ENCOUNTER — Ambulatory Visit: Payer: BC Managed Care – PPO | Admitting: Psychology

## 2020-11-24 ENCOUNTER — Ambulatory Visit (INDEPENDENT_AMBULATORY_CARE_PROVIDER_SITE_OTHER): Payer: BC Managed Care – PPO | Admitting: Psychology

## 2020-11-24 DIAGNOSIS — F411 Generalized anxiety disorder: Secondary | ICD-10-CM

## 2020-11-25 ENCOUNTER — Encounter (INDEPENDENT_AMBULATORY_CARE_PROVIDER_SITE_OTHER): Payer: Self-pay | Admitting: Family Medicine

## 2020-11-25 ENCOUNTER — Other Ambulatory Visit: Payer: Self-pay

## 2020-11-25 ENCOUNTER — Ambulatory Visit (INDEPENDENT_AMBULATORY_CARE_PROVIDER_SITE_OTHER): Payer: BC Managed Care – PPO | Admitting: Family Medicine

## 2020-11-25 VITALS — BP 134/87 | HR 81 | Temp 98.2°F | Ht 60.0 in | Wt 156.0 lb

## 2020-11-25 DIAGNOSIS — G43101 Migraine with aura, not intractable, with status migrainosus: Secondary | ICD-10-CM

## 2020-11-25 DIAGNOSIS — Z9189 Other specified personal risk factors, not elsewhere classified: Secondary | ICD-10-CM | POA: Diagnosis not present

## 2020-11-25 DIAGNOSIS — E669 Obesity, unspecified: Secondary | ICD-10-CM

## 2020-11-25 DIAGNOSIS — F418 Other specified anxiety disorders: Secondary | ICD-10-CM

## 2020-11-25 DIAGNOSIS — E559 Vitamin D deficiency, unspecified: Secondary | ICD-10-CM | POA: Diagnosis not present

## 2020-11-25 DIAGNOSIS — Z6830 Body mass index (BMI) 30.0-30.9, adult: Secondary | ICD-10-CM

## 2020-11-25 MED ORDER — BUSPIRONE HCL 7.5 MG PO TABS: 7.5000 mg | ORAL_TABLET | Freq: Three times a day (TID) | ORAL | 1 refills | Status: DC | PRN

## 2020-11-25 MED ORDER — BUPROPION HCL ER (SR) 150 MG PO TB12: 150.0000 mg | ORAL_TABLET | Freq: Every day | ORAL | 1 refills | Status: DC

## 2020-11-25 MED ORDER — VORTIOXETINE HBR 20 MG PO TABS: 20.0000 mg | ORAL_TABLET | Freq: Every day | ORAL | 1 refills | Status: DC

## 2020-11-25 MED ORDER — VITAMIN D (ERGOCALCIFEROL) 1.25 MG (50000 UNIT) PO CAPS: 50000.0000 [IU] | ORAL_CAPSULE | ORAL | 0 refills | Status: DC

## 2020-11-30 NOTE — Progress Notes (Signed)
Chief Complaint:   OBESITY Aimee Gray is here to discuss her progress with her obesity treatment plan along with follow-up of her obesity related diagnoses. Aimee Gray is on the Category 2 Plan and states she is following her eating plan approximately 50% of the time. Aimee Gray states she is not currently exercising.  Today's visit was #: 18 Starting weight: 156 lbs Starting date: 10/28/2019 Today's weight: 156 lbs Today's date: 11/25/2020 Total lbs lost to date: 0 Total lbs lost since last in-office visit: 0  Interim History: Aimee Gray voices that she has spent 2 months taking care of her mom after a shoulder fracture. She is experiencing a lot of familial stress and had to put her dog down 4 days ago. She has incorporated soda back into her life. Pt is having severe migraines.  Subjective:   1. Depression with anxiety Aimee Gray is on Trintellix, Buspar, and Wellbutrin. She reports lots of significant familial stress and recent death of her dog. Pt denies suicidal or homicidal ideations.  2. Vitamin D deficiency Pt denies nausea, vomiting, and muscle weakness but notes fatigue. Her last Vit D level was 32.5.  3. Migraine with aura and with status migrainosus, not intractable Aimee Gray is on [name]. She reports significant cyclical headaches.  4. At risk for depression Aimee Gray is at risk for depression due to recent death of her dog.  Assessment/Plan:   1. Depression with anxiety Behavior modification techniques were discussed today to help [name] deal with her emotional/non-hunger eating behaviors.  Orders and follow up as documented in patient record.   Refill- vortioxetine HBr (TRINTELLIX) 20 MG TABS tablet; Take 1 tablet (20 mg total) by mouth daily.  Dispense: 30 tablet; Refill: 1 Refill- busPIRone (BUSPAR) 7.5 MG tablet; Take 1 tablet (7.5 mg total) by mouth 3 (three) times daily as needed (anxiety).  Dispense: 90 tablet; Refill: 1 Refill- buPROPion (WELLBUTRIN SR) 150 MG 12 hr  tablet; Take 1 tablet (150 mg total) by mouth daily.  Dispense: 30 tablet; Refill: 1  - Ambulatory referral to Psychiatry  2. Vitamin D deficiency Low Vitamin D level contributes to fatigue and are associated with obesity, breast, and colon cancer. She agrees to continue to take prescription Vitamin D @50 ,000 IU every week and will follow-up for routine testing of Vitamin D, at least 2-3 times per year to avoid over-replacement.  Refill- Vitamin D, Ergocalciferol, (DRISDOL) 1.25 MG (50000 UNIT) CAPS capsule; Take 1 capsule (50,000 Units total) by mouth every 7 (seven) days.  Dispense: 4 capsule; Refill: 0  3. Migraine with aura and with status migrainosus, not intractable Refer to neurology (Dr. ).  - Ambulatory referral to Neurology  4. At risk for depression Aimee Gray was given approximately 15 minutes of depression risk counseling today. She has risk factors for depression including familial stress and the death of her dog. We discussed the importance of a healthy work life balance, a healthy relationship with food and a good support system.  Repetitive spaced learning was employed today to elicit superior memory formation and behavioral change.   5. Class 1 obesity with serious comorbidity and body mass index (BMI) of 30.0 to 30.9 in adult, unspecified obesity type  Aimee Gray is currently in the action stage of change. As such, her goal is to continue with weight loss efforts. She has agreed to practicing portion control and making smarter food choices, such as increasing vegetables and decreasing simple carbohydrates.   Exercise goals: No exercise has been prescribed at this time.  Behavioral  modification strategies: increasing lean protein intake, meal planning and cooking strategies, keeping healthy foods in the home, and planning for success.  Aimee Gray has agreed to follow-up with our clinic in 3 weeks. She was informed of the importance of frequent follow-up visits to maximize her  success with intensive lifestyle modifications for her multiple health conditions.   Objective:   Blood pressure 134/87, pulse 81, temperature 98.2 F (36.8 C), height 5' (1.524 m), weight 156 lb (70.8 kg), SpO2 98 %. Body mass index is 30.47 kg/m.  General: Cooperative, alert, well developed, in no acute distress. HEENT: Conjunctivae and lids unremarkable. Cardiovascular: Regular rhythm.  Lungs: Normal work of breathing. Neurologic: No focal deficits.   Lab Results  Component Value Date   CREATININE 0.71 10/28/2019   BUN 7 10/28/2019   NA 139 10/28/2019   K 4.2 10/28/2019   CL 102 10/28/2019   CO2 26 10/28/2019   Lab Results  Component Value Date   ALT 17 10/28/2019   AST 16 10/28/2019   ALKPHOS 67 10/28/2019   BILITOT <0.2 10/28/2019   Lab Results  Component Value Date   HGBA1C 5.2 10/28/2019   Lab Results  Component Value Date   INSULIN 13.9 10/28/2019   Lab Results  Component Value Date   TSH 2.850 10/28/2019   Lab Results  Component Value Date   CHOL 167 10/28/2019   HDL 45 10/28/2019   LDLCALC 106 (H) 10/28/2019   TRIG 84 10/28/2019   CHOLHDL 3.4 04/17/2017   Lab Results  Component Value Date   VD25OH 32.5 10/28/2019   Lab Results  Component Value Date   WBC 7.0 10/28/2019   HGB 14.8 10/28/2019   HCT 43.7 10/28/2019   MCV 92 10/28/2019   PLT 319 10/28/2019    Attestation Statements:   Reviewed by clinician on day of visit: allergies, medications, problem list, medical history, surgical history, family history, social history, and previous encounter notes.  Edmund Hilda, CMA, am acting as transcriptionist for Reuben Likes, MD.  I have reviewed the above documentation for accuracy and completeness, and I agree with the above. - Reuben Likes, MD

## 2020-12-07 ENCOUNTER — Ambulatory Visit (INDEPENDENT_AMBULATORY_CARE_PROVIDER_SITE_OTHER): Payer: BC Managed Care – PPO | Admitting: Psychology

## 2020-12-07 DIAGNOSIS — F411 Generalized anxiety disorder: Secondary | ICD-10-CM

## 2020-12-28 ENCOUNTER — Ambulatory Visit (INDEPENDENT_AMBULATORY_CARE_PROVIDER_SITE_OTHER): Payer: BC Managed Care – PPO | Admitting: Family Medicine

## 2020-12-28 ENCOUNTER — Other Ambulatory Visit: Payer: Self-pay

## 2020-12-28 ENCOUNTER — Encounter (INDEPENDENT_AMBULATORY_CARE_PROVIDER_SITE_OTHER): Payer: Self-pay | Admitting: Family Medicine

## 2020-12-28 VITALS — BP 133/83 | HR 82 | Temp 98.0°F | Ht 60.0 in | Wt 159.0 lb

## 2020-12-28 DIAGNOSIS — F32A Depression, unspecified: Secondary | ICD-10-CM

## 2020-12-28 DIAGNOSIS — F419 Anxiety disorder, unspecified: Secondary | ICD-10-CM | POA: Diagnosis not present

## 2020-12-28 DIAGNOSIS — E8881 Metabolic syndrome: Secondary | ICD-10-CM | POA: Diagnosis not present

## 2020-12-28 DIAGNOSIS — E669 Obesity, unspecified: Secondary | ICD-10-CM

## 2020-12-28 DIAGNOSIS — Z9189 Other specified personal risk factors, not elsewhere classified: Secondary | ICD-10-CM | POA: Diagnosis not present

## 2020-12-28 DIAGNOSIS — F418 Other specified anxiety disorders: Secondary | ICD-10-CM

## 2020-12-28 DIAGNOSIS — Z6834 Body mass index (BMI) 34.0-34.9, adult: Secondary | ICD-10-CM

## 2020-12-28 MED ORDER — TIRZEPATIDE 2.5 MG/0.5ML ~~LOC~~ SOAJ: 2.5000 mg | SUBCUTANEOUS | 0 refills | Status: DC

## 2020-12-28 MED ORDER — BUPROPION HCL ER (SR) 150 MG PO TB12: 150.0000 mg | ORAL_TABLET | Freq: Every day | ORAL | 1 refills | Status: DC

## 2020-12-28 MED ORDER — VORTIOXETINE HBR 20 MG PO TABS
20.0000 mg | ORAL_TABLET | Freq: Every day | ORAL | 1 refills | Status: DC
Start: 2020-12-28 — End: 2021-03-30

## 2020-12-29 ENCOUNTER — Other Ambulatory Visit (INDEPENDENT_AMBULATORY_CARE_PROVIDER_SITE_OTHER): Payer: Self-pay | Admitting: Family Medicine

## 2020-12-29 ENCOUNTER — Encounter (INDEPENDENT_AMBULATORY_CARE_PROVIDER_SITE_OTHER): Payer: Self-pay | Admitting: Family Medicine

## 2020-12-29 DIAGNOSIS — E8881 Metabolic syndrome: Secondary | ICD-10-CM

## 2020-12-29 NOTE — Progress Notes (Signed)
Chief Complaint:   OBESITY Aimee Gray is here to discuss her progress with her obesity treatment plan along with follow-up of her obesity related diagnoses. Aimee Gray is on practicing portion control and making smarter food choices, such as increasing vegetables and decreasing simple carbohydrates and states she is following her eating plan approximately 80-90% of the time. Aimee Gray states she walked on the beach 30 minutes 7 times per week.  Today's visit was #: 19 Starting weight: 156 lbs Starting date: 10/28/2019 Today's weight: 159 lbs Today's date: 12/28/2020 Total lbs lost to date: 0 Total lbs lost since last in-office visit: 0  Interim History: Aimee Gray just got back from a 2 week long vacation to the beach and then to [place]. Her son had recently got a Dexcom and was having repetitive hypoglycemia. She reports increase in frequency of migraines. Pt did get a new puppy. She wants to start walking.   Subjective:   1. Anxiety and depression Aimee Gray reports some increase in anxiety with her son's hypoglycemia. She is recognizing some decrease in drive to control indulgent eating.  2. Insulin resistance Her last A1c was 5.2 and insulin level 13.9. Pt has GLP-1 denied.  3. At risk for side effect of medication Aimee Gray is at risk for side effects of medication due to starting [facility].  Assessment/Plan:   1. Anxiety and depression Behavior modification techniques were discussed today to help Aimee Gray deal with her anxiety.  Orders and follow up as documented in patient record.   Refill- vortioxetine HBr (TRINTELLIX) 20 MG TABS tablet; Take 1 tablet (20 mg total) by mouth daily.  Dispense: 90 tablet; Refill: 1 Refill- buPROPion (WELLBUTRIN SR) 150 MG 12 hr tablet; Take 1 tablet (150 mg total) by mouth daily.  Dispense: 90 tablet; Refill: 1  2. Insulin resistance Aimee Gray will start Mounjaro 2.5 mg, as prescribed below. She will continue to work on weight loss, exercise, and  decreasing simple carbohydrates to help decrease the risk of diabetes. Aimee Gray agreed to follow-up with [name] as directed to closely monitor her progress.  Start- tirzepatide [facility]) 2.5 MG/0.5ML Pen; Inject 2.5 mg into the skin once a week. Patient has copay card  Dispense: 2 mL; Refill: 0  3. At risk for side effect of medication Aimee Gray was given approximately 15 minutes of drug side effect counseling today.  We discussed side effect possibility and risk versus benefits. Aimee Gray agreed to the medication and will contact this office if these side effects are intolerable.  Repetitive spaced learning was employed today to elicit superior memory formation and behavioral change.   4. Obesity with current BMI of 31.1  Aimee Gray is currently in the action stage of change. As such, her goal is to continue with weight loss efforts. She has agreed to practicing portion control and making smarter food choices, such as increasing vegetables and decreasing simple carbohydrates.   Exercise goals: All adults should avoid inactivity. Some physical activity is better than none, and adults who participate in any amount of physical activity gain some health benefits.  Behavioral modification strategies: increasing lean protein intake, meal planning and cooking strategies, keeping healthy foods in the home, and planning for success.  Aimee Gray has agreed to follow-up with our clinic in 3 weeks. She was informed of the importance of frequent follow-up visits to maximize her success with intensive lifestyle modifications for her multiple health conditions.   Objective:   Blood pressure 133/83, pulse 82, temperature 98 F (36.7 C), height 5' (1.524 m), weight 159  lb (72.1 kg), SpO2 98 %. Body mass index is 31.05 kg/m.  General: Cooperative, alert, well developed, in no acute distress. HEENT: Conjunctivae and lids unremarkable. Cardiovascular: Regular rhythm.  Lungs: Normal work of breathing. Neurologic: No  focal deficits.   Lab Results  Component Value Date   CREATININE 0.71 10/28/2019   BUN 7 10/28/2019   NA 139 10/28/2019   K 4.2 10/28/2019   CL 102 10/28/2019   CO2 26 10/28/2019   Lab Results  Component Value Date   ALT 17 10/28/2019   AST 16 10/28/2019   ALKPHOS 67 10/28/2019   BILITOT <0.2 10/28/2019   Lab Results  Component Value Date   HGBA1C 5.2 10/28/2019   Lab Results  Component Value Date   INSULIN 13.9 10/28/2019   Lab Results  Component Value Date   TSH 2.850 10/28/2019   Lab Results  Component Value Date   CHOL 167 10/28/2019   HDL 45 10/28/2019   LDLCALC 106 (H) 10/28/2019   TRIG 84 10/28/2019   CHOLHDL 3.4 04/17/2017   Lab Results  Component Value Date   VD25OH 32.5 10/28/2019   Lab Results  Component Value Date   WBC 7.0 10/28/2019   HGB 14.8 10/28/2019   HCT 43.7 10/28/2019   MCV 92 10/28/2019   PLT 319 10/28/2019    Attestation Statements:   Reviewed by clinician on day of visit: allergies, medications, problem list, medical history, surgical history, family history, social history, and previous encounter notes.  Edmund Hilda, CMA, am acting as transcriptionist for Reuben Likes, MD.  I have reviewed the above documentation for accuracy and completeness, and I agree with the above. - Reuben Likes, MD

## 2020-12-30 ENCOUNTER — Encounter (INDEPENDENT_AMBULATORY_CARE_PROVIDER_SITE_OTHER): Payer: Self-pay

## 2020-12-30 NOTE — Telephone Encounter (Signed)
Dr.Ukleja 

## 2021-01-19 ENCOUNTER — Other Ambulatory Visit: Payer: Self-pay

## 2021-01-19 ENCOUNTER — Encounter (INDEPENDENT_AMBULATORY_CARE_PROVIDER_SITE_OTHER): Payer: Self-pay | Admitting: Family Medicine

## 2021-01-19 ENCOUNTER — Ambulatory Visit (INDEPENDENT_AMBULATORY_CARE_PROVIDER_SITE_OTHER): Payer: BC Managed Care – PPO | Admitting: Family Medicine

## 2021-01-19 VITALS — BP 124/81 | HR 86 | Temp 98.1°F | Ht 60.0 in | Wt 150.0 lb

## 2021-01-19 DIAGNOSIS — E8881 Metabolic syndrome: Secondary | ICD-10-CM

## 2021-01-19 DIAGNOSIS — E559 Vitamin D deficiency, unspecified: Secondary | ICD-10-CM | POA: Diagnosis not present

## 2021-01-19 DIAGNOSIS — Z9189 Other specified personal risk factors, not elsewhere classified: Secondary | ICD-10-CM | POA: Diagnosis not present

## 2021-01-19 DIAGNOSIS — Z6830 Body mass index (BMI) 30.0-30.9, adult: Secondary | ICD-10-CM

## 2021-01-19 DIAGNOSIS — E669 Obesity, unspecified: Secondary | ICD-10-CM | POA: Diagnosis not present

## 2021-01-19 MED ORDER — TIRZEPATIDE 2.5 MG/0.5ML ~~LOC~~ SOAJ: 2.5000 mg | SUBCUTANEOUS | 0 refills | Status: DC

## 2021-01-20 NOTE — Progress Notes (Signed)
Chief Complaint:   OBESITY Aimee Gray is here to discuss her progress with her obesity treatment plan along with follow-up of her obesity related diagnoses. Aimee Gray is on practicing portion control and making smarter food choices, such as increasing vegetables and decreasing simple carbohydrates and states she is following her eating plan approximately 50% of the time. Aimee Gray states she is walking 10,000 steps 5 times per week.  Today's visit was #: 20 Starting weight: 156 lbs Starting date: 10/28/2019 Today's weight: 150 lbs Today's date: 01/19/2021 Total lbs lost to date: 6 Total lbs lost since last in-office visit: 9  Interim History: Aimee Gray restarted work/school 2 days ago and has had some obstacles. She has been on [facility] for a few weeks and is really relieved to not think about food constantly. Pt has been drinking more water and wants to focus on continuing water and eat protein.  Subjective:   1. Insulin resistance Alvita is on Mounjaro. She reports significant decrease in quantity of food but significant increase in control of food intake.  2. Vitamin D deficiency Pt is on prescription Vit D. Her last Vit D level was 32.5 (this hasn't been done since June 2021).  3. At risk for deficient intake of food Khadijatou is at risk for deficient intake of food due to decreased appetite.  Assessment/Plan:   1. Insulin resistance Aimee Gray will continue to work on weight loss, exercise, and decreasing simple carbohydrates to help decrease the risk of diabetes. Aimee Gray agreed to follow-up with Aimee Gray as directed to closely monitor her progress.  Refill- tirzepatide (MOUNJARO) 2.5 MG/0.5ML Pen; Inject 2.5 mg into the skin once a week. Patient has copay card  Dispense: 2 mL; Refill: 0  2. Vitamin D deficiency We will hold off on refilling prescription until repeat labs. Low Vitamin D level contributes to fatigue and are associated with obesity, breast, and colon cancer. She agrees to  continue to take prescription Vitamin D 50,000 IU every week and will follow-up for routine testing of Vitamin D, at least 2-3 times per year to avoid over-replacement.  3. At risk for deficient intake of food Aimee Gray was given approximately 15 minutes of deficit intake of food prevention counseling today. Aimee Gray is at risk for eating too few calories based on current food recall. She was encouraged to focus on meeting caloric and protein goals according to her recommended meal plan.   4. Obesity with current BMI of 29.5  Aimee Gray is currently in the action stage of change. As such, her goal is to continue with weight loss efforts. She has agreed to practicing portion control and making smarter food choices, such as increasing vegetables and decreasing simple carbohydrates.   Exercise goals:  As is  Behavioral modification strategies: increasing lean protein intake, no skipping meals, and meal planning and cooking strategies.  Aimee Gray has agreed to follow-up with our clinic in 3 weeks. She was informed of the importance of frequent follow-up visits to maximize her success with intensive lifestyle modifications for her multiple health conditions.   Objective:   Blood pressure 124/81, pulse 86, temperature 98.1 F (36.7 C), height 5' (1.524 m), weight 150 lb (68 kg), SpO2 97 %. Body mass index is 29.29 kg/m.  General: Cooperative, alert, well developed, in no acute distress. HEENT: Conjunctivae and lids unremarkable. Cardiovascular: Regular rhythm.  Lungs: Normal work of breathing. Neurologic: No focal deficits.   Lab Results  Component Value Date   CREATININE 0.71 10/28/2019   BUN 7 10/28/2019  NA 139 10/28/2019   K 4.2 10/28/2019   CL 102 10/28/2019   CO2 26 10/28/2019   Lab Results  Component Value Date   ALT 17 10/28/2019   AST 16 10/28/2019   ALKPHOS 67 10/28/2019   BILITOT <0.2 10/28/2019   Lab Results  Component Value Date   HGBA1C 5.2 10/28/2019   Lab Results   Component Value Date   INSULIN 13.9 10/28/2019   Lab Results  Component Value Date   TSH 2.850 10/28/2019   Lab Results  Component Value Date   CHOL 167 10/28/2019   HDL 45 10/28/2019   LDLCALC 106 (H) 10/28/2019   TRIG 84 10/28/2019   CHOLHDL 3.4 04/17/2017   Lab Results  Component Value Date   VD25OH 32.5 10/28/2019   Lab Results  Component Value Date   WBC 7.0 10/28/2019   HGB 14.8 10/28/2019   HCT 43.7 10/28/2019   MCV 92 10/28/2019   PLT 319 10/28/2019    Attestation Statements:   Reviewed by clinician on day of visit: allergies, medications, problem list, medical history, surgical history, family history, social history, and previous encounter notes.  Edmund Hilda, CMA, am acting as transcriptionist for Reuben Likes, MD.   I have reviewed the above documentation for accuracy and completeness, and I agree with the above. - Reuben Likes, MD

## 2021-01-24 ENCOUNTER — Other Ambulatory Visit (INDEPENDENT_AMBULATORY_CARE_PROVIDER_SITE_OTHER): Payer: Self-pay | Admitting: Family Medicine

## 2021-01-24 DIAGNOSIS — E8881 Metabolic syndrome: Secondary | ICD-10-CM

## 2021-01-24 DIAGNOSIS — F418 Other specified anxiety disorders: Secondary | ICD-10-CM

## 2021-01-26 ENCOUNTER — Encounter (INDEPENDENT_AMBULATORY_CARE_PROVIDER_SITE_OTHER): Payer: Self-pay

## 2021-01-26 NOTE — Telephone Encounter (Signed)
Message sent to pt-CAS 

## 2021-02-09 ENCOUNTER — Encounter (INDEPENDENT_AMBULATORY_CARE_PROVIDER_SITE_OTHER): Payer: Self-pay | Admitting: Family Medicine

## 2021-02-09 ENCOUNTER — Ambulatory Visit (INDEPENDENT_AMBULATORY_CARE_PROVIDER_SITE_OTHER): Payer: BC Managed Care – PPO | Admitting: Family Medicine

## 2021-02-09 ENCOUNTER — Other Ambulatory Visit: Payer: Self-pay

## 2021-02-09 VITALS — BP 129/81 | HR 84 | Temp 98.0°F | Ht 60.0 in | Wt 144.0 lb

## 2021-02-09 DIAGNOSIS — F419 Anxiety disorder, unspecified: Secondary | ICD-10-CM

## 2021-02-09 DIAGNOSIS — Z9189 Other specified personal risk factors, not elsewhere classified: Secondary | ICD-10-CM

## 2021-02-09 DIAGNOSIS — E8881 Metabolic syndrome: Secondary | ICD-10-CM

## 2021-02-09 DIAGNOSIS — E669 Obesity, unspecified: Secondary | ICD-10-CM | POA: Diagnosis not present

## 2021-02-09 DIAGNOSIS — F32A Depression, unspecified: Secondary | ICD-10-CM | POA: Diagnosis not present

## 2021-02-09 DIAGNOSIS — Z6834 Body mass index (BMI) 34.0-34.9, adult: Secondary | ICD-10-CM

## 2021-02-09 MED ORDER — TIRZEPATIDE 2.5 MG/0.5ML ~~LOC~~ SOAJ
2.5000 mg | SUBCUTANEOUS | 0 refills | Status: DC
Start: 2021-02-09 — End: 2021-03-08

## 2021-02-10 NOTE — Progress Notes (Signed)
Chief Complaint:   OBESITY Aimee Gray is here to discuss her progress with her obesity treatment plan along with follow-up of her obesity related diagnoses. [name] is on practicing portion control and making smarter food choices, such as increasing vegetables and decreasing simple carbohydrates and states she is following her eating plan approximately 90% of the time. [name] states she is doing 10,000 steps 5 times a week.  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 She is also dealing with familial stress with her son's blood sugar issues. She feels portion control has been good and recognizes that she could get in more vegetables. Pt is still trying to push water at work. She recognizes she doesn't really like vegetables. She is seeing attention person on October 5th.  Subjective:   1. Insulin resistance Aimee Gray is on Mounjaro with good control of carb intake. Her last A1c was 5.2 and insulin level 13.9.  2. Anxiety and depression Pt denies suicidal or homicidal ideations. She is on Buspar, Wellbutrin, and Trintellix.  3. At risk for nausea Aimee Gray is at risk for nausea due to [facility].  Assessment/Plan:   1. Insulin resistance [place] will continue to work on weight loss, exercise, and decreasing simple carbohydrates to help decrease the risk of diabetes. [name] agreed to follow-up with [name] as directed to closely monitor her progress. Check labs at next appt.  Refill- tirzepatide (MOUNJARO) 2.5 MG/0.5ML Pen; Inject 2.5 mg into the skin once a week. Patient has copay card  Dispense: 2 mL; Refill: 0  2. Anxiety and depression Behavior modification techniques were discussed today to help [name] deal with her anxiety.   Orders and follow up as documented in patient record. Continue current treatment plan. No refills needed today.   3. At risk for nausea [name] was given approximately 15 minutes of nausea prevention counseling today. Aimee Gray is at risk for nausea due to her new or current medication. She was encouraged to titrate her medication slowly, make sure to stay hydrated, eat smaller portions throughout the day, and avoid high fat meals.    4. Obesity with current BMI of 28.2  Aimee Gray is currently in the action stage of change. As such, her goal is to continue with weight loss efforts. She has agreed to practicing portion control and making smarter food choices, such as increasing vegetables and decreasing simple carbohydrates.   Exercise goals:  As is  Behavioral modification strategies: increasing lean protein intake, decreasing eating out, meal planning and cooking strategies, and keeping healthy foods in the home.  Aimee Gray has agreed to follow-up with our clinic in 3 weeks. She was informed of the importance of frequent follow-up visits to maximize her success with intensive lifestyle modifications for her multiple health conditions.   Objective:   Blood pressure 129/81, pulse 84, temperature 98 F (36.7 C), height 5' (1.524 m), weight 144 lb (65.3 kg), SpO2 98 %. Body mass index is 28.12 kg/m.  General: Cooperative, alert, well developed, in no acute distress. HEENT: Conjunctivae and lids unremarkable. Cardiovascular: Regular rhythm.  Lungs: Normal work of breathing. Neurologic: No focal deficits.   Lab Results  Component Value Date   CREATININE 0.71 10/28/2019   BUN 7 10/28/2019   NA 139  10/28/2019   K 4.2 10/28/2019   CL 102 10/28/2019   CO2 26 10/28/2019   Lab Results  Component Value Date   ALT 17 10/28/2019   AST 16 10/28/2019   ALKPHOS 67 10/28/2019   BILITOT <0.2 10/28/2019   Lab Results  Component Value Date   HGBA1C 5.2 10/28/2019   Lab Results   Component Value Date   INSULIN 13.9 10/28/2019   Lab Results  Component Value Date   TSH 2.850 10/28/2019   Lab Results  Component Value Date   CHOL 167 10/28/2019   HDL 45 10/28/2019   LDLCALC 106 (H) 10/28/2019   TRIG 84 10/28/2019   CHOLHDL 3.4 04/17/2017   Lab Results  Component Value Date   VD25OH 32.5 10/28/2019   Lab Results  Component Value Date   WBC 7.0 10/28/2019   HGB 14.8 10/28/2019   HCT 43.7 10/28/2019   MCV 92 10/28/2019   PLT 319 10/28/2019    Attestation Statements:   Reviewed by clinician on day of visit: allergies, medications, problem list, medical history, surgical history, family history, social history, and previous encounter notes.  Edmund Hilda, CMA, am acting as transcriptionist for Reuben Likes, MD.   I have reviewed the above documentation for accuracy and completeness, and I agree with the above. - Reuben Likes, MD

## 2021-02-14 ENCOUNTER — Ambulatory Visit (HOSPITAL_COMMUNITY)
Admission: EM | Admit: 2021-02-14 | Discharge: 2021-02-14 | Disposition: A | Discharge status: Home / Self Care | Payer: BC Managed Care – PPO | Attending: Family Medicine | Admitting: Family Medicine

## 2021-02-14 ENCOUNTER — Other Ambulatory Visit: Payer: Self-pay

## 2021-02-14 ENCOUNTER — Encounter (HOSPITAL_COMMUNITY): Payer: Self-pay | Admitting: Emergency Medicine

## 2021-02-14 DIAGNOSIS — N390 Urinary tract infection, site not specified: Secondary | ICD-10-CM | POA: Insufficient documentation

## 2021-02-14 DIAGNOSIS — Z20822 Contact with and (suspected) exposure to covid-19: Secondary | ICD-10-CM | POA: Insufficient documentation

## 2021-02-14 LAB — POCT URINALYSIS DIPSTICK, ED / UC
Glucose, UA: 250 mg/dL — AB
Ketones, ur: 15 mg/dL — AB
Nitrite: POSITIVE — AB
Protein, ur: >=300 mg/dL — AB
Specific Gravity, Urine: 1.01 (ref 1.005–1.030)
Urobilinogen, UA: >=8 mg/dL (ref 0.0–1.0)
pH: 5 (ref 5.0–8.0)

## 2021-02-14 MED ORDER — FLUCONAZOLE 150 MG PO TABS: 150.0000 mg | ORAL_TABLET | Freq: Once | ORAL | 0 refills | Status: AC

## 2021-02-14 MED ORDER — SULFAMETHOXAZOLE-TRIMETHOPRIM 800-160 MG PO TABS: 1.0000 | ORAL_TABLET | Freq: Two times a day (BID) | ORAL | 0 refills | Status: DC

## 2021-02-14 NOTE — ED Provider Notes (Signed)
[facility]    CSN: [id number] Arrival date & time: 02/14/21  1708      History   Chief Complaint Chief Complaint  Patient presents with   Flank Pain   Chills    HPI Aimee Gray is a 50 y.o. female.   Patient presenting today with 3-day history of progressively worsening chills, fatigue, bilateral flank pain, suprapubic pressure, nausea, diarrhea, sore throat, congestion.  She denies fever, vomiting, rashes, chest pain, shortness of breath, cough.  So far taking Tylenol and Azo with mild temporary relief of symptoms.  Does have a frequent history of urinary tract infections that feels similar.  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  Past Medical History:  Diagnosis Date   Allergy    Anxiety    Back pain    Depression    Infertility, female    Lactose intolerance    Migraines    Palpitations    PCOS (polycystic ovarian syndrome)     Patient Active Problem List   Diagnosis Date Noted   Vitamin D deficiency 03/31/2020   Insulin resistance 03/31/2020   Class 1 obesity with serious comorbidity and body mass index (BMI) of 30.0 to 30.9 in adult 01/22/2020   Insomnia 01/07/2019   Acute bronchitis 08/10/2016   Migraine with aura and with status migrainosus, not intractable 08/10/2016   Depression 10/14/2014   Generalized anxiety disorder 10/14/2014    Past Surgical History:  Procedure Laterality Date   CESAREAN SECTION      OB History     Gravida  3   Para      Term      Preterm      AB      Living         SAB      IAB      Ectopic      Multiple      Live Births               Home Medications    Prior to Admission medications   Medication Sig Start Date End Date Taking? Authorizing Provider  fluconazole (DIFLUCAN) 150 MG tablet Take 1 tablet (150 mg total) by mouth once for 1 dose. 02/14/21 02/14/21 Yes Particia Nearing, PA-C  sulfamethoxazole-trimethoprim (BACTRIM DS)  800-160 MG tablet Take 1 tablet by mouth 2 (two) times daily. 02/14/21  Yes Particia Nearing, PA-C  buPROPion [facility] SR) 150 MG 12 hr tablet Take 1 tablet (150 mg total) by mouth daily. 12/28/20   Langston Reusing, MD  busPIRone (BUSPAR) 7.5 MG tablet Take 1 tablet (7.5 mg total) by mouth 3 (three) times daily as needed (anxiety). 11/25/20   Langston Reusing, MD  levonorgestrel (MIRENA) 20 MCG/24HR IUD 1 each by Intrauterine route once.    [provider]  Melatonin 5 MG CAPS Take 5 mg by mouth.    [provider]  ondansetron (ZOFRAN) 4 MG tablet Take 1 tablet (4 mg total) by mouth every 8 (eight) hours as needed for nausea or vomiting. 01/28/20   Janeece Agee, NP  oxymetazoline (AFRIN NASAL SPRAY) 0.05 % nasal spray Place 1 spray into both nostrils 2 (two) times daily. Patient not taking: Reported on 02/09/2021 05/18/20   Just, Azalee Course, FNP  rizatriptan ([name]-MLT) 10 MG disintegrating tablet DISSOLVE 1 TAB BY MOUTH AS NEEDED FOR MIGRAINE. MAY REPEAT IN 2 HOURS IF NEEDED 09/01/20   Langston Reusing,  MD  tirzepatide [facility]) 2.5 MG/0.5ML Pen Inject 2.5 mg into the skin once a week. Patient has copay card 02/09/21   Langston Reusing, MD  Vitamin D, Ergocalciferol, (DRISDOL) 1.25 MG (50000 UNIT) CAPS capsule Take 1 capsule (50,000 Units total) by mouth every 7 (seven) days. 11/25/20   Langston Reusing, MD  vortioxetine HBr (TRINTELLIX) 20 MG TABS tablet Take 1 tablet (20 mg total) by mouth daily. 12/28/20   Langston Reusing, MD    Family History Family History  Problem Relation Age of Onset   Heart disease Mother    Hypertension Mother    Thyroid disease Mother    Depression Mother    Anxiety disorder Mother    Sleep apnea Mother    Obesity Mother    Diabetes Father    Heart disease Father    Hyperlipidemia Father    Hypertension Father    Cancer Maternal Grandmother    Diabetes Maternal Grandfather    Stroke Maternal Grandfather    Heart  disease Paternal Grandmother    Hypertension Paternal Grandmother    Heart disease Paternal Grandfather     Social History Social History   Tobacco Use   Smoking status: Former   Smokeless tobacco: Never   Tobacco comments:    quit 5 years ago  Substance Use Topics   Alcohol use: No   Drug use: No     Allergies   Hydrocodone   Review of Systems Review of Systems Per HPI  Physical Exam Triage Vital Signs ED Triage Vitals  Enc Vitals Group     BP 02/14/21 1744 137/89     Pulse Rate 02/14/21 1744 86     Resp 02/14/21 1744 18     Temp 02/14/21 1744 98.1 F (36.7 C)     Temp Source 02/14/21 1744 Oral     SpO2 02/14/21 1744 98 %     Weight --      Height --      Head Circumference --      Peak Flow --      Pain Score 02/14/21 1746 8     Pain Loc --      Pain Edu? --      Excl. in GC? --    No data found.  Updated Vital Signs BP 137/89 (BP Location: Left Arm)   Pulse 86   Temp 98.1 F (36.7 C) (Oral)   Resp 18   SpO2 98%   Visual Acuity Right Eye Distance:   Left Eye Distance:   Bilateral Distance:    Right Eye Near:   Left Eye Near:    Bilateral Near:     Physical Exam Vitals and nursing note reviewed.  Constitutional:      Appearance: Normal appearance. She is not ill-appearing.  HENT:     Head: Atraumatic.     Right Ear: Tympanic membrane normal.     Left Ear: Tympanic membrane normal.     Nose: Rhinorrhea present.     Mouth/Throat:     Mouth: Mucous membranes are moist.     Pharynx: Oropharynx is clear. No posterior oropharyngeal erythema.  Eyes:     Extraocular Movements: Extraocular movements intact.     Conjunctiva/sclera: Conjunctivae normal.  Cardiovascular:     Rate and Rhythm: Normal rate and regular rhythm.     Heart sounds: Normal heart sounds.  Pulmonary:     Effort: Pulmonary effort is normal.     Breath sounds: Normal breath sounds.  No wheezing or rales.  Abdominal:     General: Bowel sounds are normal. There is no  distension.     Palpations: Abdomen is soft.     Tenderness: There is abdominal tenderness. There is no right CVA tenderness, left CVA tenderness or guarding.     Comments: Mild suprapubic tenderness palpation without guarding or distention  Musculoskeletal:        General: Normal range of motion.     Cervical back: Normal range of motion and neck supple.  Skin:    General: Skin is warm and dry.  Neurological:     Mental Status: She is alert and oriented to person, place, and time.  Psychiatric:        Mood and Affect: Mood normal.        Thought Content: Thought content normal.        Judgment: Judgment normal.     UC Treatments / Results  Labs (all labs ordered are listed, but only abnormal results are displayed) Labs Reviewed  POCT URINALYSIS DIPSTICK, ED / UC - Abnormal; Notable for the following components:      Result Value   Glucose, UA 250 (*)    Bilirubin Urine MODERATE (*)    Ketones, ur 15 (*)    Hgb urine dipstick MODERATE (*)    Protein, ur >=300 (*)    Nitrite POSITIVE (*)    Leukocytes,Ua LARGE (*)    All other components within normal limits  SARS CORONAVIRUS 2 (TAT 6-24 HRS)    EKG   Radiology No results found.  Procedures Procedures (including critical care time)  Medications Ordered in UC Medications - No data to display  Initial Impression / Assessment and Plan / UC Course  I have reviewed the triage vital signs and the nursing notes.  Pertinent labs & imaging results that were available during my care of the patient were reviewed by me and considered in my medical decision making (see chart for details).     Vital signs and exam overall reassuring today, urinalysis positive for a urinary tract infection.  Urine culture pending, COVID swab performed given her exposures and other symptoms that may be viral in addition to her urinary tract infection.  Discussed starting Bactrim for urinary tract infection, pushing fluids, supportive  over-the-counter medications and home care.  We will also send Diflucan as she states she gets yeast infections with antibiotic use.  Return for acutely worsening symptoms.  Work note given with quarantine protocol.  Final Clinical Impressions(s) / UC Diagnoses   Final diagnoses:  Acute lower UTI  Exposure to COVID-19 virus   Discharge Instructions   None    ED Prescriptions     Medication Sig Dispense Auth. Provider   sulfamethoxazole-trimethoprim (BACTRIM DS) 800-160 MG tablet Take 1 tablet by mouth 2 (two) times daily. 10 tablet Particia Nearing, [place]   fluconazole (DIFLUCAN) 150 MG tablet Take 1 tablet (150 mg total) by mouth once for 1 dose. 1 tablet Particia Nearing, [place]      PDMP not reviewed this encounter.   Particia Nearing, [place] 02/14/21 1815

## 2021-02-14 NOTE — ED Triage Notes (Signed)
Since Friday having chills, flank pain, bladder pressure, fatigue, nausea and diarrhea. 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

## 2021-02-15 LAB — SARS CORONAVIRUS 2 (TAT 6-24 HRS): SARS Coronavirus 2: NEGATIVE

## 2021-02-27 ENCOUNTER — Other Ambulatory Visit: Payer: Self-pay | Admitting: Registered Nurse

## 2021-02-27 DIAGNOSIS — G43101 Migraine with aura, not intractable, with status migrainosus: Secondary | ICD-10-CM

## 2021-03-01 ENCOUNTER — Ambulatory Visit: Payer: BC Managed Care – PPO | Admitting: Neurology

## 2021-03-01 ENCOUNTER — Encounter: Payer: Self-pay | Admitting: Neurology

## 2021-03-01 VITALS — BP 109/69 | HR 70 | Ht 60.0 in | Wt 148.0 lb

## 2021-03-01 DIAGNOSIS — G43001 Migraine without aura, not intractable, with status migrainosus: Secondary | ICD-10-CM

## 2021-03-01 MED ORDER — NURTEC 75 MG PO TBDP: 75.0000 mg | ORAL_TABLET | ORAL | 3 refills | Status: DC | PRN

## 2021-03-01 MED ORDER — GABAPENTIN 600 MG PO TABS: 600.0000 mg | ORAL_TABLET | Freq: Three times a day (TID) | ORAL | 0 refills | Status: DC

## 2021-03-01 MED ORDER — ONDANSETRON 4 MG PO TBDP: 4.0000 mg | ORAL_TABLET | Freq: Three times a day (TID) | ORAL | 0 refills | Status: DC | PRN

## 2021-03-01 NOTE — Patient Instructions (Addendum)
Start with Gabapentin 300 mg nightly as prevention of migraines, can increase to 600 mg as tolerated  Start Nurtec 75 as soon as the headaches start, only 1 tab per 24hrs period  Zofran as needed for nausea/vomiting  MRI Brain with and without contrast  Return to clinic in 3 months or sooner if worse.

## 2021-03-01 NOTE — Progress Notes (Signed)
[facility]  PATIENT: Aimee Gray DOB: [birth year]  REFERRING CLINICIAN: Langston Reusing, MD HISTORY FROM: Patient  REASON FOR VISIT: Migraines    HISTORICAL  CHIEF COMPLAINT:  Chief Complaint  Patient presents with   New Patient (Initial Visit)    Rm 13, alone, reports 5-6 migraines a month, pt does not have pcp, states maxalt is no longer effective for, migraines have become more intense in the last year, would like to discuss other medications     HISTORY OF PRESENT ILLNESS:  This is a 50 year old woman with past medical history of anxiety depression, insomnia and prediabetes who is presenting for migraines.  Patient stated she has a been diagnosed with migraines since her childhood.  For her migraines, they typically started behind the left eye, sometimes it can switch to the right eye.  Her migraine has been fairly controlled with medication, she report when she was pregnant did not have any episodes but after giving birth the migraine restarted.  Patient reported lately her migraines have increased in frequency, she is getting about 10 migraine days per month.  Her only medication is [name] and is currently ineffective.  She has tried multiple preventive medications including amitriptyline, topiramate, gabapentin, and propanolol.  For abortive medication she has tried Tylenol, ibuprofen and [name].  Migraines are associated with nausea vomiting, she reported she has to stay in a dark room.  She has a strong family history of migraine including her sister and her 2 nieces.   Headache History and Characteristics: Onset: Since child  Location: Behind left eye but can switch between left and right eye  Quality: Excruciating pain, sometimes can be throbbing  Intensity: 6-8/10.  Duration: 12-24 hrs Migrainous Features: Photophobia, phonophobia, nausea, vomiting.  Aura: No  History of brain injury or tumor: No  Family history: Sister and niece with  Migraines  Motion sickness: no Cardiac history: no  OTC: tylenol, Ibuprofen  Caffeine: Yes  Sleep: not great, will get up to 7 hours some night, often time cannot sleep without a sleep aid  Mood/ Stress: Teacher, 2nd grade   Prior prophylaxis: Propranolol: Yes Verapamil:No TCA: Yes Topamax: Yes, brain fog Depakote: No Effexor: No Cymbalta: No Neurontin:Yes   Prior abortives: Triptan: [name] Anti-emetic: No Steroids: No Ergotamine suppository: No   OTHER MEDICAL CONDITIONS: Depression, Insomnia, Anxiety, Pre-diabetes    REVIEW OF SYSTEMS: Full 14 system review of systems performed and negative with exception of: as noted in the HPI  ALLERGIES: Allergies  Allergen Reactions   Hydrocodone Nausea And Vomiting    HOME MEDICATIONS: Outpatient Medications Prior to Visit  Medication Sig Dispense Refill   buPROPion (WELLBUTRIN SR) 150 MG 12 hr tablet Take 1 tablet (150 mg total) by mouth daily. 90 tablet 1   busPIRone (BUSPAR) 7.5 MG tablet Take 1 tablet (7.5 mg total) by mouth 3 (three) times daily as needed (anxiety). 90 tablet 1   levonorgestrel (MIRENA) 20 MCG/24HR IUD 1 each by Intrauterine route once.     Melatonin 5 MG CAPS Take 5 mg by mouth.     rizatriptan ([name]-MLT) 10 MG disintegrating tablet DISSOLVE 1 TAB BY MOUTH AS NEEDED FOR MIGRAINE. MAY REPEAT IN 2 HOURS IF NEEDED. NEEDS OFFICE VISIT 10 tablet 6   tirzepatide (MOUNJARO) 2.5 MG/0.5ML Pen Inject 2.5 mg into the skin once a week. Patient has copay card 2 mL 0   Vitamin D, Ergocalciferol, (DRISDOL) 1.25 MG (50000 UNIT) CAPS capsule Take 1 capsule (50,000 Units total) by  mouth every 7 (seven) days. 4 capsule 0   vortioxetine HBr (TRINTELLIX) 20 MG TABS tablet Take 1 tablet (20 mg total) by mouth daily. 90 tablet 1   ondansetron (ZOFRAN) 4 MG tablet Take 1 tablet (4 mg total) by mouth every 8 (eight) hours as needed for nausea or vomiting. 20 tablet 0   oxymetazoline (AFRIN NASAL SPRAY) 0.05 % nasal spray Place  1 spray into both nostrils 2 (two) times daily. (Patient not taking: Reported on 02/09/2021) 30 mL 0   sulfamethoxazole-trimethoprim (BACTRIM DS) 800-160 MG tablet Take 1 tablet by mouth 2 (two) times daily. 10 tablet 0   No facility-administered medications prior to visit.    PAST MEDICAL HISTORY: Past Medical History:  Diagnosis Date   Allergy    Anxiety    Back pain    Depression    Infertility, female    Lactose intolerance    Migraines    Palpitations    PCOS (polycystic ovarian syndrome)     PAST SURGICAL HISTORY: Past Surgical History:  Procedure Laterality Date   CESAREAN SECTION      FAMILY HISTORY: Family History  Problem Relation Age of Onset   Heart disease Mother    Hypertension Mother    Thyroid disease Mother    Depression Mother    Anxiety disorder Mother    Sleep apnea Mother    Obesity Mother    Diabetes Father    Heart disease Father    Hyperlipidemia Father    Hypertension Father    Cancer Maternal Grandmother    Diabetes Maternal Grandfather    Stroke Maternal Grandfather    Heart disease Paternal Grandmother    Hypertension Paternal Grandmother    Heart disease Paternal Grandfather     SOCIAL HISTORY: Social History   Socioeconomic History   Marital status: Married    Spouse name: Not on file   Number of children: Not on file   Years of education: Not on file   Highest education level: Not on file  Occupational History   Not on file  Tobacco Use   Smoking status: Former   Smokeless tobacco: Never   Tobacco comments:    quit 5 years ago  Vaping Use   Vaping Use: Not on file  Substance and Sexual Activity   Alcohol use: No   Drug use: No   Sexual activity: Never  Other Topics Concern   Not on file  Social History Narrative   Not on file   Social Determinants of Health   Financial Resource Strain: Not on file  Food Insecurity: Not on file  Transportation Needs: Not on file  Physical Activity: Not on file  Stress: Not  on file  Social Connections: Not on file  Intimate Partner Violence: Not on file     PHYSICAL EXAM  GENERAL EXAM/CONSTITUTIONAL: Vitals:  Vitals:   03/01/21 1456  BP: 109/69  Pulse: 70  Weight: 148 lb (67.1 kg)  Height: 5' (1.524 m)   Body mass index is 28.9 kg/m. Wt Readings from Last 3 Encounters:  03/01/21 148 lb (67.1 kg)  02/09/21 144 lb (65.3 kg)  01/19/21 150 lb (68 kg)   Patient is in no distress; well developed, nourished and groomed; neck is supple  EYES: Pupils round and reactive to light, Visual fields full to confrontation, Extraocular movements intacts,   MUSCULOSKELETAL: Gait, strength, tone, movements noted in Neurologic exam below  NEUROLOGIC: MENTAL STATUS:  No flowsheet data found. awake, alert, oriented to person,  place and time recent and remote memory intact normal attention and concentration language fluent, comprehension intact, naming intact fund of knowledge appropriate  CRANIAL NERVE:  2nd, 3rd, 4th, 6th - pupils equal and reactive to light, visual fields full to confrontation, extraocular muscles intact, no nystagmus 5th - facial sensation symmetric 7th - facial strength symmetric 8th - hearing intact 9th - palate elevates symmetrically, uvula midline 11th - shoulder shrug symmetric 12th - tongue protrusion midline  MOTOR:  normal bulk and tone, full strength in the BUE, BLE  SENSORY:  normal and symmetric to light touch, pinprick, temperature, vibration  COORDINATION:  finger-nose-finger, fine finger movements normal  REFLEXES:  deep tendon reflexes present and symmetric  GAIT/STATION:  normal   DIAGNOSTIC DATA (LABS, IMAGING, TESTING) - I reviewed patient records, labs, notes, testing and imaging myself where available.  Lab Results  Component Value Date   WBC 7.0 10/28/2019   HGB 14.8 10/28/2019   HCT 43.7 10/28/2019   MCV 92 10/28/2019   PLT 319 10/28/2019      Component Value Date/Time   NA 139 10/28/2019  1146   K 4.2 10/28/2019 1146   CL 102 10/28/2019 1146   CO2 26 10/28/2019 1146   GLUCOSE 84 10/28/2019 1146   BUN 7 10/28/2019 1146   CREATININE 0.71 10/28/2019 1146   CALCIUM 9.1 10/28/2019 1146   PROT 6.4 10/28/2019 1146   ALBUMIN 4.5 10/28/2019 1146   AST 16 10/28/2019 1146   ALT 17 10/28/2019 1146   ALKPHOS 67 10/28/2019 1146   BILITOT <0.2 10/28/2019 1146   GFRNONAA 102 10/28/2019 1146   GFRAA 118 10/28/2019 1146   Lab Results  Component Value Date   CHOL 167 10/28/2019   HDL 45 10/28/2019   LDLCALC 106 (H) 10/28/2019   TRIG 84 10/28/2019   CHOLHDL 3.4 04/17/2017   Lab Results  Component Value Date   HGBA1C 5.2 10/28/2019   Lab Results  Component Value Date   VITAMINB12 245 10/28/2019   Lab Results  Component Value Date   TSH 2.850 10/28/2019     ASSESSMENT AND PLAN  50 y.o. year old female with past medical history of anxiety depression, insomnia, prediabetes and headaches who is presenting for management of her migraine.  Patient report a diagnosis of migraine since her childhood, initially it was followed by her pediatrician.  She has not seen a neurologist for her migraine.  She has never had a brain MRI for her migraine.  Currently the frequency of her migraine has increased and she is getting about 10 migraine days per month.  Her [name] is ineffective.  She has also tried ibuprofen and Tylenol without much help.  Even though she tried gabapentin in the past I will reintroduce it to help with her sleep and also to help prevent migraine.  I will start her on Nurtec as abortive medication, (sample given in the office), I will obtain a brain MRI and will see the patient in 3 months for follow-up.  Advised the patient if Nurtec is not covered by her insurance, we can try a combination of sumatriptan + naproxen + Benadryl.    1. Migraine without aura and with status migrainosus, not intractable     PLAN: Start with Gabapentin 300 mg nightly as prevention of  migraines, can increase to 600 mg as tolerated  Start Nurtec 75 as soon as the headaches start, only 1 tab per 24hrs period  Zofran as needed for nausea/vomiting  MRI Brain with and  without contrast  Return to clinic in 3 months or sooner if worse.   Orders Placed This Encounter  Procedures   MR BRAIN W WO CONTRAST    Meds ordered this encounter  Medications   ondansetron (ZOFRAN ODT) 4 MG disintegrating tablet    Sig: Take 1 tablet (4 mg total) by mouth every 8 (eight) hours as needed for nausea or vomiting.    Dispense:  30 tablet    Refill:  0   gabapentin (NEURONTIN) 600 MG tablet    Sig: Take 1 tablet (600 mg total) by mouth 3 (three) times daily.    Dispense:  90 tablet    Refill:  0   Rimegepant Sulfate (NURTEC) 75 MG TBDP    Sig: Take 75 mg by mouth as needed.    Dispense:  10 tablet    Refill:  3    Return in about 3 months (around 06/01/2021).    Windell Norfolk, MD 03/01/2021, 3:51 PM  [facility] [address], [address] [place], [place] [address] [phone]

## 2021-03-08 ENCOUNTER — Ambulatory Visit (INDEPENDENT_AMBULATORY_CARE_PROVIDER_SITE_OTHER): Payer: BC Managed Care – PPO | Admitting: Family Medicine

## 2021-03-08 ENCOUNTER — Other Ambulatory Visit: Payer: Self-pay

## 2021-03-08 ENCOUNTER — Encounter (INDEPENDENT_AMBULATORY_CARE_PROVIDER_SITE_OTHER): Payer: Self-pay | Admitting: Family Medicine

## 2021-03-08 VITALS — BP 117/79 | HR 76 | Temp 97.7°F | Ht 60.0 in | Wt 142.0 lb

## 2021-03-08 DIAGNOSIS — E669 Obesity, unspecified: Secondary | ICD-10-CM

## 2021-03-08 DIAGNOSIS — Z6834 Body mass index (BMI) 34.0-34.9, adult: Secondary | ICD-10-CM

## 2021-03-08 DIAGNOSIS — E8881 Metabolic syndrome: Secondary | ICD-10-CM

## 2021-03-08 DIAGNOSIS — R300 Dysuria: Secondary | ICD-10-CM

## 2021-03-08 DIAGNOSIS — E559 Vitamin D deficiency, unspecified: Secondary | ICD-10-CM

## 2021-03-08 DIAGNOSIS — E7800 Pure hypercholesterolemia, unspecified: Secondary | ICD-10-CM | POA: Diagnosis not present

## 2021-03-08 DIAGNOSIS — E88819 Insulin resistance, unspecified: Secondary | ICD-10-CM

## 2021-03-08 DIAGNOSIS — F902 Attention-deficit hyperactivity disorder, combined type: Secondary | ICD-10-CM

## 2021-03-08 DIAGNOSIS — E538 Deficiency of other specified B group vitamins: Secondary | ICD-10-CM

## 2021-03-08 MED ORDER — CEPHALEXIN 500 MG PO CAPS: 500.0000 mg | ORAL_CAPSULE | Freq: Three times a day (TID) | ORAL | 0 refills | Status: AC

## 2021-03-08 MED ORDER — TIRZEPATIDE 2.5 MG/0.5ML ~~LOC~~ SOAJ
2.5000 mg | SUBCUTANEOUS | 0 refills | Status: DC
Start: 2021-03-08 — End: 2021-03-24

## 2021-03-09 NOTE — Progress Notes (Signed)
Chief Complaint:   OBESITY Aimee Gray is here to discuss her progress with her obesity treatment plan along with follow-up of her obesity related diagnoses. See Medical Weight Management Flowsheet for complete bioelectrical impedance results.  Today's visit was #: 22 Starting weight: 156 lbs Starting date: 10/28/2019 Weight change since last visit: 2 lbs Total lbs lost to date: 14 lbs Total weight loss percentage to date: -8.97%  Nutrition Plan: Practicing portion control and making smarter food choices, such as increasing vegetables and decreasing simple carbohydrates for 80% of the time. Activity: Increased walking. Anti-obesity medications: Mounjaro 2.5 mg subcutaneously weekly. Reported side effects: None.  Interim History: Aimee Gray endorses nausea with Mounjaro, but complicated by UTI and migraines.  Endorses 2 UTIs in the past few months.  Now with dysuria.   [facility] prescribed Vyvanse, but she is not sure if she wants to add another medication.  She is taking Mounjaro 2.5 mg weekly.  Assessment/Plan:   1. Insulin resistance, with polyphagia Not at goal. Goal is HgbA1c < 5.7, fasting insulin closer to 5.  Medication: Mounjaro 2.5 mg subcutaneously weekly.    Plan:  Continue Mounjaro 2.5 mg weekly.  Will refill today, as per below.  She will continue to focus on protein-rich, low simple carbohydrate foods. We reviewed the importance of hydration, regular exercise for stress reduction, and restorative sleep.   Lab Results  Component Value Date   HGBA1C 5.2 10/28/2019   Lab Results  Component Value Date   INSULIN 13.9 10/28/2019   - Refill tirzepatide (MOUNJARO) 2.5 MG/0.5ML Pen; Inject 2.5 mg into the skin once a week. Patient has copay card  Dispense: 2 mL; Refill: 0  2. Vitamin D deficiency Not at goal.  She is taking vitamin D 50,000 IU weekly.  Plan: Continue to take prescription Vitamin D @50 ,000 IU every week as prescribed.  Follow-up for  routine testing of Vitamin D, at least 2-3 times per year to avoid over-replacement.  Lab Results  Component Value Date   VD25OH 32.5 10/28/2019   3. Dysuria Start Keflex 500 mg three times daily for 5 days.  Will check UA and culture today.  - Urinalysis, Routine w reflex microscopic - Urine Culture - Start cephALEXin (KEFLEX) 500 MG capsule; Take 1 capsule (500 mg total) by mouth 3 (three) times daily for 5 days.  Dispense: 15 capsule; Refill: 0  4. Pure hypercholesterolemia Course: Not at goal. Lipid-lowering medications: None.   Plan: Dietary changes: Increase soluble fiber, decrease simple carbohydrates, decrease saturated fat. Exercise changes: Moderate to vigorous-intensity aerobic activity 150 minutes per week or as tolerated. We will continue to monitor along with PCP/specialists as it pertains to her weight loss journey.  Will check labs today.  Lab Results  Component Value Date   CHOL 170 03/08/2021   HDL 39 (L) 03/08/2021   LDLCALC 118 (H) 03/08/2021   TRIG 66 03/08/2021   CHOLHDL 4.4 03/08/2021   Lab Results  Component Value Date   ALT 15 03/08/2021   AST 14 03/08/2021   ALKPHOS 48 03/08/2021   BILITOT 0.4 03/08/2021   The 10-year ASCVD risk score (Arnett DK, et al., 2019) is: 1.1%   Values used to calculate the score:     Age: 50 years     Sex: Female     Is Non-Hispanic African American: No     Diabetic: No     Tobacco smoker: No     Systolic Blood Pressure: 117 mmHg  Is BP treated: No     HDL Cholesterol: 39 mg/dL     Total Cholesterol: 170 mg/dL  - Comprehensive metabolic panel - Lipid panel  5. Low folate Will check CBC and folate level today.  - CBC with Differential/Platelet - Folate  6. Attention deficit hyperactivity disorder (ADHD), combined type She has been prescribed Vyvanse by [facility] for ADHD, but is unsure if she wants to start taking it.  7. Obesity with current BMI of 27.8  Course: Aimee Gray is currently  in the action stage of change. As such, her goal is to continue with weight loss efforts.   Nutrition goals: She has agreed to practicing portion control and making smarter food choices, such as increasing vegetables and decreasing simple carbohydrates.   Exercise goals:  As is.  Behavioral modification strategies: increasing lean protein intake, decreasing simple carbohydrates, increasing vegetables, and increasing water intake.  Aimee Gray has agreed to follow-up with our clinic in 2 weeks. She was informed of the importance of frequent follow-up visits to maximize her success with intensive lifestyle modifications for her multiple health conditions.   Objective:   Blood pressure 117/79, pulse 76, temperature 97.7 F (36.5 C), temperature source Oral, height 5' (1.524 m), weight 142 lb (64.4 kg), SpO2 97 %. Body mass index is 27.73 kg/m.  General: Cooperative, alert, well developed, in no acute distress. HEENT: Conjunctivae and lids unremarkable. Cardiovascular: Regular rhythm.  Lungs: Normal work of breathing. Neurologic: No focal deficits.   Lab Results  Component Value Date   CREATININE 0.87 03/08/2021   BUN 8 03/08/2021   NA 139 03/08/2021   K 4.2 03/08/2021   CL 102 03/08/2021   CO2 23 03/08/2021   Lab Results  Component Value Date   ALT 15 03/08/2021   AST 14 03/08/2021   ALKPHOS 48 03/08/2021   BILITOT 0.4 03/08/2021   Lab Results  Component Value Date   HGBA1C 5.2 10/28/2019   Lab Results  Component Value Date   INSULIN 13.9 10/28/2019   Lab Results  Component Value Date   TSH 2.850 10/28/2019   Lab Results  Component Value Date   CHOL 170 03/08/2021   HDL 39 (L) 03/08/2021   LDLCALC 118 (H) 03/08/2021   TRIG 66 03/08/2021   CHOLHDL 4.4 03/08/2021   Lab Results  Component Value Date   VD25OH 32.5 10/28/2019   Lab Results  Component Value Date   WBC 10.4 03/08/2021   HGB 14.1 03/08/2021   HCT 40.5 03/08/2021   MCV 91 03/08/2021   PLT 373  03/08/2021   Attestation Statements:   Reviewed by clinician on day of visit: allergies, medications, problem list, medical history, surgical history, family history, social history, and previous encounter notes. Time : 45 minutes.  I, Insurance claims handler, CMA, am acting as transcriptionist for Helane Rima, DO  I have reviewed the above documentation for accuracy and completeness, and I agree with the above. -  Helane Rima, DO, MS, FAAFP, DABOM - Family and Bariatric Medicine.

## 2021-03-11 LAB — MICROSCOPIC EXAMINATION
Bacteria, UA: NONE SEEN
Casts: NONE SEEN /lpf

## 2021-03-11 LAB — CBC WITH DIFFERENTIAL/PLATELET
Basophils Absolute: 0.1 10*3/uL (ref 0.0–0.2)
Basos: 1 %
EOS (ABSOLUTE): 0 10*3/uL (ref 0.0–0.4)
Eos: 0 %
Hematocrit: 40.5 % (ref 34.0–46.6)
Hemoglobin: 14.1 g/dL (ref 11.1–15.9)
Immature Grans (Abs): 0 10*3/uL (ref 0.0–0.1)
Immature Granulocytes: 0 %
Lymphocytes Absolute: 1.8 10*3/uL (ref 0.7–3.1)
Lymphs: 17 %
MCH: 31.8 pg (ref 26.6–33.0)
MCHC: 34.8 g/dL (ref 31.5–35.7)
MCV: 91 fL (ref 79–97)
Monocytes Absolute: 0.6 10*3/uL (ref 0.1–0.9)
Monocytes: 5 %
Neutrophils Absolute: 8 10*3/uL — ABNORMAL HIGH (ref 1.4–7.0)
Neutrophils: 77 %
Platelets: 373 10*3/uL (ref 150–450)
RBC: 4.43 x10E6/uL (ref 3.77–5.28)
RDW: 12.8 % (ref 11.7–15.4)
WBC: 10.4 10*3/uL (ref 3.4–10.8)

## 2021-03-11 LAB — URINALYSIS, ROUTINE W REFLEX MICROSCOPIC
Bilirubin, UA: NEGATIVE
Glucose, UA: NEGATIVE
Ketones, UA: NEGATIVE
Nitrite, UA: POSITIVE — AB
Specific Gravity, UA: 1.013 (ref 1.005–1.030)
Urobilinogen, Ur: 1 mg/dL (ref 0.2–1.0)
pH, UA: 6.5 (ref 5.0–7.5)

## 2021-03-11 LAB — COMPREHENSIVE METABOLIC PANEL
ALT: 15 IU/L (ref 0–32)
AST: 14 IU/L (ref 0–40)
Albumin/Globulin Ratio: 2.9 — ABNORMAL HIGH (ref 1.2–2.2)
Albumin: 4.7 g/dL (ref 3.8–4.8)
Alkaline Phosphatase: 48 IU/L (ref 44–121)
BUN/Creatinine Ratio: 9 (ref 9–23)
BUN: 8 mg/dL (ref 6–24)
Bilirubin Total: 0.4 mg/dL (ref 0.0–1.2)
CO2: 23 mmol/L (ref 20–29)
Calcium: 9 mg/dL (ref 8.7–10.2)
Chloride: 102 mmol/L (ref 96–106)
Creatinine, Ser: 0.87 mg/dL (ref 0.57–1.00)
Globulin, Total: 1.6 g/dL (ref 1.5–4.5)
Glucose: 74 mg/dL (ref 70–99)
Potassium: 4.2 mmol/L (ref 3.5–5.2)
Sodium: 139 mmol/L (ref 134–144)
Total Protein: 6.3 g/dL (ref 6.0–8.5)
eGFR: 82 mL/min/{1.73_m2} (ref >59)

## 2021-03-11 LAB — LIPID PANEL
Chol/HDL Ratio: 4.4 ratio (ref 0.0–4.4)
Cholesterol, Total: 170 mg/dL (ref 100–199)
HDL: 39 mg/dL — ABNORMAL LOW (ref >39)
LDL Chol Calc (NIH): 118 mg/dL — ABNORMAL HIGH (ref 0–99)
Triglycerides: 66 mg/dL (ref 0–149)
VLDL Cholesterol Cal: 13 mg/dL (ref 5–40)

## 2021-03-11 LAB — FOLATE: Folate: 7.5 ng/mL (ref >3.0)

## 2021-03-11 LAB — URINE CULTURE

## 2021-03-24 ENCOUNTER — Encounter (INDEPENDENT_AMBULATORY_CARE_PROVIDER_SITE_OTHER): Payer: Self-pay | Admitting: Family Medicine

## 2021-03-24 DIAGNOSIS — E8881 Metabolic syndrome: Secondary | ICD-10-CM

## 2021-03-24 MED ORDER — TIRZEPATIDE 2.5 MG/0.5ML ~~LOC~~ SOAJ: 2.5000 mg | SUBCUTANEOUS | 0 refills | Status: DC

## 2021-03-24 NOTE — Telephone Encounter (Signed)
Pt last seen by Dr. Wallace.  

## 2021-03-29 ENCOUNTER — Encounter (INDEPENDENT_AMBULATORY_CARE_PROVIDER_SITE_OTHER): Payer: Self-pay | Admitting: Family Medicine

## 2021-03-29 ENCOUNTER — Ambulatory Visit (INDEPENDENT_AMBULATORY_CARE_PROVIDER_SITE_OTHER): Payer: BC Managed Care – PPO | Admitting: Family Medicine

## 2021-03-29 ENCOUNTER — Other Ambulatory Visit: Payer: Self-pay

## 2021-03-29 VITALS — BP 111/73 | HR 95 | Temp 98.3°F | Ht 60.0 in | Wt 137.0 lb

## 2021-03-29 DIAGNOSIS — N62 Hypertrophy of breast: Secondary | ICD-10-CM | POA: Diagnosis not present

## 2021-03-29 DIAGNOSIS — Z6834 Body mass index (BMI) 34.0-34.9, adult: Secondary | ICD-10-CM

## 2021-03-29 DIAGNOSIS — F908 Attention-deficit hyperactivity disorder, other type: Secondary | ICD-10-CM | POA: Diagnosis not present

## 2021-03-29 DIAGNOSIS — F32A Depression, unspecified: Secondary | ICD-10-CM

## 2021-03-29 DIAGNOSIS — E669 Obesity, unspecified: Secondary | ICD-10-CM

## 2021-03-29 DIAGNOSIS — F419 Anxiety disorder, unspecified: Secondary | ICD-10-CM

## 2021-03-29 DIAGNOSIS — F3289 Other specified depressive episodes: Secondary | ICD-10-CM | POA: Diagnosis not present

## 2021-03-30 MED ORDER — VORTIOXETINE HBR 20 MG PO TABS: 20.0000 mg | ORAL_TABLET | Freq: Every day | ORAL | 1 refills | Status: DC

## 2021-03-30 NOTE — Progress Notes (Signed)
Chief Complaint:   OBESITY Aimee Gray is here to discuss her progress with her obesity treatment plan along with follow-up of her obesity related diagnoses. Aimee Gray is on practicing portion control and making smarter food choices, such as increasing vegetables and decreasing simple carbohydrates and states she is following her eating plan approximately 85% of the time. Aimee Gray states she is doing 10,000 steps 5 times per week.  Today's visit was #: 23 Starting weight: 156 lbs Starting date: 10/28/2019 Today's weight: 137 lbs Today's date: 03/29/2021 Total lbs lost to date: 19 Total lbs lost since last in-office visit: 5  Interim History: Since her last appt, [name] [facility] and was in 99% for ADHD. She started Vyvanse, which really decreased her appetite. She started Gabapentin for migraines after seeing neurology. Aimee Gray has struggled with getting enough food in during the day.  Subjective:   1. Other depression Aimee Gray's symptoms are well controlled on Trintellix, bupropion, and Buspar. Pt denies suicidal or homicidal ideations.  2. Attention deficit hyperactivity disorder (ADHD), other type Aimee Gray just started Vyvanse, which decreased her appetite.  3. Breast hypertrophy Aimee Gray is interested in pursuing an evaluation for reduction. She has significant and frequent candida infections under her breasts.  Assessment/Plan:   1. Other depression Behavior modification techniques were discussed today to help Aimee Gray deal with her emotional/non-hunger eating behaviors.  Orders and follow up as documented in patient record.   Refill Trintellix 20 mg PO daily, Disp #90, 0 RF  2. Attention deficit hyperactivity disorder (ADHD), other type Follow up with [facility]  3. Breast hypertrophy Refer to Dr. Ulice Bold at [facility].  - Ambulatory referral to Plastic Surgery  4. Obesity with current BMI of 26.8  Aimee Gray is  currently in the action stage of change. As such, her goal is to continue with weight loss efforts. She has agreed to practicing portion control and making smarter food choices, such as increasing vegetables and decreasing simple carbohydrates.   Exercise goals:  As is  Behavioral modification strategies: increasing lean protein intake, meal planning and cooking strategies, keeping healthy foods in the home, and planning for success.  Aimee Gray has agreed to follow-up with our clinic in 3 weeks. She was informed of the importance of frequent follow-up visits to maximize her success with intensive lifestyle modifications for her multiple health conditions.   Objective:   Blood pressure 111/73, pulse 95, temperature 98.3 F (36.8 C), height 5' (1.524 m), weight 137 lb (62.1 kg), SpO2 97 %. Body mass index is 26.76 kg/m.  General: Cooperative, alert, well developed, in no acute distress. HEENT: Conjunctivae and lids unremarkable. Cardiovascular: Regular rhythm.  Lungs: Normal work of breathing. Neurologic: No focal deficits.   Lab Results  Component Value Date   CREATININE 0.87 03/08/2021   BUN 8 03/08/2021   NA 139 03/08/2021   K 4.2 03/08/2021   CL 102 03/08/2021   CO2 23 03/08/2021   Lab Results  Component Value Date   ALT 15 03/08/2021   AST 14 03/08/2021   ALKPHOS 48 03/08/2021   BILITOT 0.4 03/08/2021   Lab Results  Component Value Date   HGBA1C 5.2 10/28/2019   Lab Results  Component Value Date   INSULIN 13.9 10/28/2019   Lab Results  Component Value Date   TSH 2.850 10/28/2019   Lab Results  Component Value Date   CHOL 170 03/08/2021   HDL 39 (L) 03/08/2021   LDLCALC 118 (H) 03/08/2021   TRIG  66 03/08/2021   CHOLHDL 4.4 03/08/2021   Lab Results  Component Value Date   VD25OH 32.5 10/28/2019   Lab Results  Component Value Date   WBC 10.4 03/08/2021   HGB 14.1 03/08/2021   HCT 40.5 03/08/2021   MCV 91 03/08/2021   PLT 373 03/08/2021     Attestation Statements:   Reviewed by clinician on day of visit: allergies, medications, problem list, medical history, surgical history, family history, social history, and previous encounter notes.  Edmund Hilda, CMA, am acting as transcriptionist for Reuben Likes, MD.   I have reviewed the above documentation for accuracy and completeness, and I agree with the above. - Reuben Likes, MD

## 2021-04-05 ENCOUNTER — Encounter (INDEPENDENT_AMBULATORY_CARE_PROVIDER_SITE_OTHER): Payer: Self-pay | Admitting: Family Medicine

## 2021-04-28 ENCOUNTER — Other Ambulatory Visit: Payer: Self-pay

## 2021-04-28 ENCOUNTER — Encounter (INDEPENDENT_AMBULATORY_CARE_PROVIDER_SITE_OTHER): Payer: Self-pay | Admitting: Family Medicine

## 2021-04-28 ENCOUNTER — Ambulatory Visit (INDEPENDENT_AMBULATORY_CARE_PROVIDER_SITE_OTHER): Payer: BC Managed Care – PPO | Admitting: Family Medicine

## 2021-04-28 VITALS — BP 144/74 | HR 86 | Temp 98.1°F | Ht 60.0 in | Wt 139.0 lb

## 2021-04-28 DIAGNOSIS — E669 Obesity, unspecified: Secondary | ICD-10-CM

## 2021-04-28 DIAGNOSIS — E8881 Metabolic syndrome: Secondary | ICD-10-CM

## 2021-04-28 DIAGNOSIS — Z6834 Body mass index (BMI) 34.0-34.9, adult: Secondary | ICD-10-CM

## 2021-04-28 DIAGNOSIS — E559 Vitamin D deficiency, unspecified: Secondary | ICD-10-CM

## 2021-04-28 MED ORDER — TIRZEPATIDE 2.5 MG/0.5ML ~~LOC~~ SOAJ: 2.5000 mg | SUBCUTANEOUS | 0 refills | Status: DC

## 2021-04-28 NOTE — Progress Notes (Signed)
Chief Complaint:   OBESITY Aimee Gray is here to discuss her progress with her obesity treatment plan along with follow-up of her obesity related diagnoses. Aimee Gray is on practicing portion control and making smarter food choices, such as increasing vegetables and decreasing simple carbohydrates and states she is following her eating plan approximately 85% of the time. Aimee Gray states she is walking 8-10,000 steps 5 times per week.  Today's visit was #: 24 Starting weight: 156 lbs Starting date: 10/28/2019 Today's weight: 139 lbs Today's date: 04/28/2021 Total lbs lost to date: 17 Total lbs lost since last in-office visit: 0  Interim History: Aimee Gray is noticing work somewhat levels out this time of year. She had an enjoyable Thanksgiving and is prepping to decorate her house for Christmas. She is seeing Dr. Ulice Bold in February. The next few weeks will include some home organizing and maybe touching base with friends.  Subjective:   1. Insulin resistance, with polyphagia Aimee Gray reports her appetite has significantly increased since starting Vyvanse.  2. Vitamin D deficiency Pt last Vit D level was 32.5, drawn 10/2019. She is not on supplementation.  Assessment/Plan:   1. Insulin resistance, with polyphagia Aimee Gray will continue to work on weight loss, exercise, and decreasing simple carbohydrates to help decrease the risk of diabetes. Aimee Gray agreed to follow-up with [name] as directed to closely monitor her progress. Continue Mounjaro 2.5 mg weekly.  Refill- tirzepatide (MOUNJARO) 2.5 MG/0.5ML Pen; Inject 2.5 mg into the skin once a week. Patient has copay card  Dispense: 2 mL; Refill: 0  2. Vitamin D deficiency Low Vitamin D level contributes to fatigue and are associated with obesity, breast, and colon cancer. She  will follow-up for routine testing of Vitamin D, at least 2-3 times per year to avoid over-replacement. We will repeat labs in 3 months.  3. Obesity with current BMI of  27.1  Aimee Gray is currently in the action stage of change. As such, her goal is to continue with weight loss efforts. She has agreed to the Category 2 Plan.   Exercise goals:  As is  Behavioral modification strategies: increasing lean protein intake, meal planning and cooking strategies, keeping healthy foods in the home, and holiday eating strategies .  Aimee Gray has agreed to follow-up with our clinic in 4 weeks. She was informed of the importance of frequent follow-up visits to maximize her success with intensive lifestyle modifications for her multiple health conditions.   Objective:   Blood pressure (!) 144/74, pulse 86, temperature 98.1 F (36.7 C), height 5' (1.524 m), weight 139 lb (63 kg), SpO2 98 %. Body mass index is 27.15 kg/m.  General: Cooperative, alert, well developed, in no acute distress. HEENT: Conjunctivae and lids unremarkable. Cardiovascular: Regular rhythm.  Lungs: Normal work of breathing. Neurologic: No focal deficits.   Lab Results  Component Value Date   CREATININE 0.87 03/08/2021   BUN 8 03/08/2021   NA 139 03/08/2021   K 4.2 03/08/2021   CL 102 03/08/2021   CO2 23 03/08/2021   Lab Results  Component Value Date   ALT 15 03/08/2021   AST 14 03/08/2021   ALKPHOS 48 03/08/2021   BILITOT 0.4 03/08/2021   Lab Results  Component Value Date   HGBA1C 5.2 10/28/2019   Lab Results  Component Value Date   INSULIN 13.9 10/28/2019   Lab Results  Component Value Date   TSH 2.850 10/28/2019   Lab Results  Component Value Date   CHOL 170 03/08/2021   HDL  39 (L) 03/08/2021   LDLCALC 118 (H) 03/08/2021   TRIG 66 03/08/2021   CHOLHDL 4.4 03/08/2021   Lab Results  Component Value Date   VD25OH 32.5 10/28/2019   Lab Results  Component Value Date   WBC 10.4 03/08/2021   HGB 14.1 03/08/2021   HCT 40.5 03/08/2021   MCV 91 03/08/2021   PLT 373 03/08/2021    Attestation Statements:   Reviewed by clinician on day of visit: allergies,  medications, problem list, medical history, surgical history, family history, social history, and previous encounter notes.  Edmund Hilda, CMA, am acting as transcriptionist for Reuben Likes, MD.   I have reviewed the above documentation for accuracy and completeness, and I agree with the above. - Reuben Likes, MD

## 2021-05-03 ENCOUNTER — Ambulatory Visit: Payer: BC Managed Care – PPO | Admitting: Neurology

## 2021-05-26 ENCOUNTER — Other Ambulatory Visit: Payer: Self-pay

## 2021-05-26 ENCOUNTER — Ambulatory Visit (INDEPENDENT_AMBULATORY_CARE_PROVIDER_SITE_OTHER): Payer: BC Managed Care – PPO | Admitting: Family Medicine

## 2021-05-26 ENCOUNTER — Encounter (INDEPENDENT_AMBULATORY_CARE_PROVIDER_SITE_OTHER): Payer: Self-pay | Admitting: Family Medicine

## 2021-05-26 VITALS — BP 115/76 | HR 79 | Temp 98.1°F | Ht 60.0 in | Wt 138.0 lb

## 2021-05-26 DIAGNOSIS — E669 Obesity, unspecified: Secondary | ICD-10-CM

## 2021-05-26 DIAGNOSIS — F419 Anxiety disorder, unspecified: Secondary | ICD-10-CM

## 2021-05-26 DIAGNOSIS — F32A Depression, unspecified: Secondary | ICD-10-CM

## 2021-05-26 DIAGNOSIS — Z6831 Body mass index (BMI) 31.0-31.9, adult: Secondary | ICD-10-CM

## 2021-05-26 DIAGNOSIS — Z6830 Body mass index (BMI) 30.0-30.9, adult: Secondary | ICD-10-CM

## 2021-05-26 DIAGNOSIS — E8881 Metabolic syndrome: Secondary | ICD-10-CM | POA: Diagnosis not present

## 2021-05-26 MED ORDER — TIRZEPATIDE 2.5 MG/0.5ML ~~LOC~~ SOAJ: 2.5000 mg | SUBCUTANEOUS | 0 refills | Status: DC

## 2021-05-27 NOTE — Progress Notes (Signed)
Chief Complaint:   OBESITY Aimee Gray is here to discuss her progress with her obesity treatment plan along with follow-up of her obesity related diagnoses. Aimee Gray is on the Category 2 Plan and states she is following her eating plan approximately 80% of the time. Aimee Gray states she is doing a squat challenge.  Today's visit was #: 25 Starting weight: 156 lbs Starting date: 10/28/2019 Today's weight: 138 lbs Today's date: 05/26/2021 Total lbs lost to date: 18 Total lbs lost since last in-office visit: 1  Interim History: Bilan had a good holiday- mom visited, visited her dad, and started a game night. Her friend's husband is going to imminently die soon. She did well over Christmas but ate more junk food than she previously did. Pt isn't eating enough vegetables and is tired of chicken.  Subjective:   1. Insulin resistance, with polyphagia Aimee Gray is doing well on Mounjaro. She reports occasional constipation or diarrhea.  2. Anxiety and depression Pt is doing well on combo Buspar, Wellbutrin, and Trintellix. Pt denies suicidal or homicidal ideations.  Assessment/Plan:   1. Insulin resistance, with polyphagia Aimee Gray will continue to work on weight loss, exercise, and decreasing simple carbohydrates to help decrease the risk of diabetes. Aimee Gray agreed to follow-up with [name] as directed to closely monitor her progress.  Refill- tirzepatide (MOUNJARO) 2.5 MG/0.5ML Pen; Inject 2.5 mg into the skin once a week. Patient has copay card  Dispense: 2 mL; Refill: 0  2. Anxiety and depression Behavior modification techniques were discussed today to help Aimee Gray deal with her anxiety.  Orders and follow up as documented in patient record. Continue current meds with no change in dose or meds.  3. Obesity with current BMI of 31.1  Aimee Gray is currently in the action stage of change. As such, her goal is to continue with weight loss efforts. She has agreed to the Category 2 Plan.   Exercise  goals: All adults should avoid inactivity. Some physical activity is better than none, and adults who participate in any amount of physical activity gain some health benefits.  Behavioral modification strategies: increasing lean protein intake, meal planning and cooking strategies, and keeping healthy foods in the home.  Aimee Gray has agreed to follow-up with our clinic in 3 weeks. She was informed of the importance of frequent follow-up visits to maximize her success with intensive lifestyle modifications for her multiple health conditions.   Objective:   Blood pressure 115/76, pulse 79, temperature 98.1 F (36.7 C), height 5' (1.524 m), weight 138 lb (62.6 kg), SpO2 99 %. Body mass index is 26.95 kg/m.  General: Cooperative, alert, well developed, in no acute distress. HEENT: Conjunctivae and lids unremarkable. Cardiovascular: Regular rhythm.  Lungs: Normal work of breathing. Neurologic: No focal deficits.   Lab Results  Component Value Date   CREATININE 0.87 03/08/2021   BUN 8 03/08/2021   NA 139 03/08/2021   K 4.2 03/08/2021   CL 102 03/08/2021   CO2 23 03/08/2021   Lab Results  Component Value Date   ALT 15 03/08/2021   AST 14 03/08/2021   ALKPHOS 48 03/08/2021   BILITOT 0.4 03/08/2021   Lab Results  Component Value Date   HGBA1C 5.2 10/28/2019   Lab Results  Component Value Date   INSULIN 13.9 10/28/2019   Lab Results  Component Value Date   TSH 2.850 10/28/2019   Lab Results  Component Value Date   CHOL 170 03/08/2021   HDL 39 (L) 03/08/2021   LDLCALC  118 (H) 03/08/2021   TRIG 66 03/08/2021   CHOLHDL 4.4 03/08/2021   Lab Results  Component Value Date   VD25OH 32.5 10/28/2019   Lab Results  Component Value Date   WBC 10.4 03/08/2021   HGB 14.1 03/08/2021   HCT 40.5 03/08/2021   MCV 91 03/08/2021   PLT 373 03/08/2021    Attestation Statements:   Reviewed by clinician on day of visit: allergies, medications, problem list, medical history,  surgical history, family history, social history, and previous encounter notes.  Edmund Hilda, CMA, am acting as transcriptionist for Reuben Likes, MD.  I have reviewed the above documentation for accuracy and completeness, and I agree with the above. - Reuben Likes, MD

## 2021-05-28 ENCOUNTER — Other Ambulatory Visit (INDEPENDENT_AMBULATORY_CARE_PROVIDER_SITE_OTHER): Payer: Self-pay | Admitting: Family Medicine

## 2021-05-28 DIAGNOSIS — E8881 Metabolic syndrome: Secondary | ICD-10-CM

## 2021-05-31 NOTE — Telephone Encounter (Signed)
Please advise 

## 2021-06-07 ENCOUNTER — Ambulatory Visit: Payer: BC Managed Care – PPO | Admitting: Neurology

## 2021-06-07 ENCOUNTER — Encounter: Payer: Self-pay | Admitting: Neurology

## 2021-06-07 ENCOUNTER — Other Ambulatory Visit: Payer: Self-pay

## 2021-06-07 ENCOUNTER — Telehealth: Payer: Self-pay | Admitting: Neurology

## 2021-06-07 DIAGNOSIS — G43101 Migraine with aura, not intractable, with status migrainosus: Secondary | ICD-10-CM | POA: Diagnosis not present

## 2021-06-07 MED ORDER — RIZATRIPTAN BENZOATE 10 MG PO TBDP: ORAL_TABLET | ORAL | 6 refills | Status: DC

## 2021-06-07 MED ORDER — GABAPENTIN 600 MG PO TABS: 600.0000 mg | ORAL_TABLET | Freq: Three times a day (TID) | ORAL | 0 refills | Status: DC

## 2021-06-07 NOTE — Patient Instructions (Signed)
Discontinue Nurtec  Continue with Gabapentin 600 mg nightly for migraine prevention  Can Take [name] with Naproxen when you have severe headaches  Will recommend Botox injection, will work on approval from insurance  Follow up with NP for Botox injection

## 2021-06-07 NOTE — Progress Notes (Signed)
[facility]  PATIENT: Aimee Gray DOB: [birth year]  REFERRING CLINICIAN: No ref. provider found HISTORY FROM: Patient  REASON FOR VISIT: Migraines follow up    HISTORICAL  CHIEF COMPLAINT:  Chief Complaint  Patient presents with   Follow-up    Rm 12. Alone. No PCP. Pt reports 1-2 migraine days per week. Pt states gabapentin has helped with sleep. Denies any significant change in severity or number of headaches. Did not find Nurtec helpful, states [name] is more effective. Was unable to get MRI due to cost.    INTERNAL HISTORY 06/07/2021:  Patient present today for follow-up, last visit was in October at that time plan was to start gabapentin as preventive medication since since it has helped patient in the past and to also start Nurtec as abortive medication.  She reported her headache frequency is about the same, she will get 1-2 migraine per week, Nurtec is not effective now she went back to [name].  She was not able to complete MRI brain due to insurance issues. Again for preventive medication patient has tried amitriptyline, topiramate, propanolol and gabapentin.     HISTORY OF PRESENT ILLNESS:  This is a 51 year old woman with past medical history of anxiety depression, insomnia and prediabetes who is presenting for migraines.  Patient stated she has a been diagnosed with migraines since her childhood.  For her migraines, they typically started behind the left eye, sometimes it can switch to the right eye.  Her migraine has been fairly controlled with medication, she report when she was pregnant did not have any episodes but after giving birth the migraine restarted.  Patient reported lately her migraines have increased in frequency, she is getting about 10 migraine days per month.  Her only medication is [name] and is currently ineffective.  She has tried multiple preventive medications including amitriptyline, topiramate, gabapentin, and propanolol.  For  abortive medication she has tried Tylenol, ibuprofen and [name].  Migraines are associated with nausea vomiting, she reported she has to stay in a dark room.  She has a strong family history of migraine including her sister and her 2 nieces.   Headache History and Characteristics: Onset: Since child  Location: Behind left eye but can switch between left and right eye  Quality: Excruciating pain, sometimes can be throbbing  Intensity: 6-8/10.  Duration: 12-24 hrs Migrainous Features: Photophobia, phonophobia, nausea, vomiting.  Aura: No  History of brain injury or tumor: No  Family history: Sister and niece with Migraines  Motion sickness: no Cardiac history: no  OTC: tylenol, Ibuprofen  Caffeine: Yes  Sleep: not great, will get up to 7 hours some night, often time cannot sleep without a sleep aid  Mood/ Stress: Teacher, 2nd grade   Prior prophylaxis: Propranolol: Yes Verapamil:No TCA: Yes Topamax: Yes, brain fog Depakote: No Effexor: No Cymbalta: No Neurontin:Yes   Prior abortives: Triptan: [name] Anti-emetic: No Steroids: No Ergotamine suppository: No   OTHER MEDICAL CONDITIONS: Depression, Insomnia, Anxiety, Pre-diabetes    REVIEW OF SYSTEMS: Full 14 system review of systems performed and negative with exception of: as noted in the HPI  ALLERGIES: Allergies  Allergen Reactions   Hydrocodone Nausea And Vomiting    HOME MEDICATIONS: Outpatient Medications Prior to Visit  Medication Sig Dispense Refill   buPROPion (WELLBUTRIN SR) 150 MG 12 hr tablet Take 1 tablet (150 mg total) by mouth daily. 90 tablet 1   busPIRone (BUSPAR) 7.5 MG tablet Take 1 tablet (7.5 mg total) by mouth 3 (three)  times daily as needed (anxiety). 90 tablet 1   dexmethylphenidate (FOCALIN XR) 10 MG 24 hr capsule Take 10 mg by mouth daily.     levonorgestrel (MIRENA) 20 MCG/24HR IUD 1 each by Intrauterine route once.     Melatonin 5 MG CAPS Take 5 mg by mouth.     ondansetron (ZOFRAN ODT)  4 MG disintegrating tablet Take 1 tablet (4 mg total) by mouth every 8 (eight) hours as needed for nausea or vomiting. 30 tablet 0   tirzepatide (MOUNJARO) 2.5 MG/0.5ML Pen Inject 2.5 mg into the skin once a week. Patient has copay card 2 mL 0   Vitamin D, Ergocalciferol, (DRISDOL) 1.25 MG (50000 UNIT) CAPS capsule Take 1 capsule (50,000 Units total) by mouth every 7 (seven) days. 4 capsule 0   vortioxetine HBr (TRINTELLIX) 20 MG TABS tablet Take 1 tablet (20 mg total) by mouth daily. 90 tablet 1   Rimegepant Sulfate (NURTEC) 75 MG TBDP Take 75 mg by mouth as needed. 10 tablet 3   rizatriptan ([name]-MLT) 10 MG disintegrating tablet DISSOLVE 1 TAB BY MOUTH AS NEEDED FOR MIGRAINE. MAY REPEAT IN 2 HOURS IF NEEDED. NEEDS OFFICE VISIT 10 tablet 6   gabapentin (NEURONTIN) 600 MG tablet Take 1 tablet (600 mg total) by mouth 3 (three) times daily. 90 tablet 0   No facility-administered medications prior to visit.    PAST MEDICAL HISTORY: Past Medical History:  Diagnosis Date   Allergy    Anxiety    Back pain    Depression    Infertility, female    Lactose intolerance    Migraines    Palpitations    PCOS (polycystic ovarian syndrome)     PAST SURGICAL HISTORY: Past Surgical History:  Procedure Laterality Date   CESAREAN SECTION      FAMILY HISTORY: Family History  Problem Relation Age of Onset   Heart disease Mother    Hypertension Mother    Thyroid disease Mother    Depression Mother    Anxiety disorder Mother    Sleep apnea Mother    Obesity Mother    Diabetes Father    Heart disease Father    Hyperlipidemia Father    Hypertension Father    Cancer Maternal Grandmother    Diabetes Maternal Grandfather    Stroke Maternal Grandfather    Heart disease Paternal Grandmother    Hypertension Paternal Grandmother    Heart disease Paternal Grandfather     SOCIAL HISTORY: Social History   Socioeconomic History   Marital status: Married    Spouse name: Not on file   Number of  children: Not on file   Years of education: Not on file   Highest education level: Not on file  Occupational History   Not on file  Tobacco Use   Smoking status: Former   Smokeless tobacco: Never   Tobacco comments:    quit 5 years ago  Vaping Use   Vaping Use: Not on file  Substance and Sexual Activity   Alcohol use: No   Drug use: No   Sexual activity: Never  Other Topics Concern   Not on file  Social History Narrative   Not on file   Social Determinants of Health   Financial Resource Strain: Not on file  Food Insecurity: Not on file  Transportation Needs: Not on file  Physical Activity: Not on file  Stress: Not on file  Social Connections: Not on file  Intimate Partner Violence: Not on file  PHYSICAL EXAM  GENERAL EXAM/CONSTITUTIONAL: Vitals:  Vitals:   06/07/21 1440  BP: 120/77  Pulse: 79  Weight: 136 lb 8 oz (61.9 kg)  Height: 5' (1.524 m)   Body mass index is 26.66 kg/m. Wt Readings from Last 3 Encounters:  06/07/21 136 lb 8 oz (61.9 kg)  05/26/21 138 lb (62.6 kg)  04/28/21 139 lb (63 kg)   Patient is in no distress; well developed, nourished and groomed; neck is supple  EYES: Pupils round and reactive to light, Visual fields full to confrontation, Extraocular movements intacts,   MUSCULOSKELETAL: Gait, strength, tone, movements noted in Neurologic exam below  NEUROLOGIC: MENTAL STATUS:  No flowsheet data found. awake, alert, oriented to person, place and time recent and remote memory intact normal attention and concentration language fluent, comprehension intact, naming intact fund of knowledge appropriate  CRANIAL NERVE:  2nd, 3rd, 4th, 6th - pupils equal and reactive to light, visual fields full to confrontation, extraocular muscles intact, no nystagmus 5th - facial sensation symmetric 7th - facial strength symmetric 8th - hearing intact 9th - palate elevates symmetrically, uvula midline 11th - shoulder shrug symmetric 12th -  tongue protrusion midline  MOTOR:  normal bulk and tone, full strength in the BUE, BLE  SENSORY:  normal and symmetric to light touch, pinprick, temperature, vibration  COORDINATION:  finger-nose-finger, fine finger movements normal  REFLEXES:  deep tendon reflexes present and symmetric  GAIT/STATION:  normal   DIAGNOSTIC DATA (LABS, IMAGING, TESTING) - I reviewed patient records, labs, notes, testing and imaging myself where available.  Lab Results  Component Value Date   WBC 10.4 03/08/2021   HGB 14.1 03/08/2021   HCT 40.5 03/08/2021   MCV 91 03/08/2021   PLT 373 03/08/2021      Component Value Date/Time   NA 139 03/08/2021 1117   K 4.2 03/08/2021 1117   CL 102 03/08/2021 1117   CO2 23 03/08/2021 1117   GLUCOSE 74 03/08/2021 1117   BUN 8 03/08/2021 1117   CREATININE 0.87 03/08/2021 1117   CALCIUM 9.0 03/08/2021 1117   PROT 6.3 03/08/2021 1117   ALBUMIN 4.7 03/08/2021 1117   AST 14 03/08/2021 1117   ALT 15 03/08/2021 1117   ALKPHOS 48 03/08/2021 1117   BILITOT 0.4 03/08/2021 1117   GFRNONAA 102 10/28/2019 1146   GFRAA 118 10/28/2019 1146   Lab Results  Component Value Date   CHOL 170 03/08/2021   HDL 39 (L) 03/08/2021   LDLCALC 118 (H) 03/08/2021   TRIG 66 03/08/2021   CHOLHDL 4.4 03/08/2021   Lab Results  Component Value Date   HGBA1C 5.2 10/28/2019   Lab Results  Component Value Date   VITAMINB12 245 10/28/2019   Lab Results  Component Value Date   TSH 2.850 10/28/2019     ASSESSMENT AND PLAN  51 y.o. year old female with past medical history of anxiety depression, insomnia, prediabetes and headaches who is presenting for migraine follow up.  Her migraine frequency is still the same, will get 1-2 migraines per week.  Nurtec is not effective for her therefore she has reverted back to [name].  Again patient has tried multiple preventive medications including propanolol, amitriptyline, gabapentin, and topiramate without clear benefit.  I think  at this time, it is reasonable to set her up for Botox injection.  We will start the preauthorization form, I will give her a refill on the gabapentin, the [name] and advised her to take a combination of [name] with naproxen until  we can get the Botox started.  This was explained to the patient, she is comfortable with plan, she understands that Botox will be administered by the NP's.  I will still see her in 6 months for follow-up.   1. Migraine with aura and with status migrainosus, not intractable      Patient Instructions  Discontinue Nurtec  Continue with Gabapentin 600 mg nightly for migraine prevention  Can Take [name] with Naproxen when you have severe headaches  Will recommend Botox injection, will work on approval from insurance  Follow up with NP for Botox injection     No orders of the defined types were placed in this encounter.   Meds ordered this encounter  Medications   gabapentin (NEURONTIN) 600 MG tablet    Sig: Take 1 tablet (600 mg total) by mouth 3 (three) times daily.    Dispense:  90 tablet    Refill:  0   rizatriptan ([name]-MLT) 10 MG disintegrating tablet    Sig: DISSOLVE 1 TAB BY MOUTH AS NEEDED FOR MIGRAINE. MAY REPEAT IN 2 HOURS IF NEEDED. NEEDS OFFICE VISIT    Dispense:  10 tablet    Refill:  6    DX Code Needed  OUT OF REFILLS.    Return in about 6 months (around 12/05/2021).    Windell NorfolkAmadou Victor Granados, MD 06/07/2021, 5:20 PM  [facility] [address], [address] [place] [id number] [phone]

## 2021-06-07 NOTE — Telephone Encounter (Signed)
Received charge sheet and signed patient consent (given to medical records) for 200 units of Botox for G43.709 [phone]). MD would like injections to be performed by NP. Patient has [employer]. I have filled out PA form and placed in nurse pod for MD signature.

## 2021-06-09 NOTE — Telephone Encounter (Signed)
Faxed PA form to [facility]. Pending. Upon approval, Botox prescription will be sent to [facility].

## 2021-06-14 NOTE — Telephone Encounter (Signed)
Thank you Ladona Ridgel, I will contact the patient to discuss options.

## 2021-06-14 NOTE — Telephone Encounter (Signed)
Received denial from [facility] for Botox. [employer] states denial is d/t patient not having trial & failure of CGRP. Patient will need to try a medication from this class before Botox is approved.

## 2021-06-14 NOTE — Telephone Encounter (Signed)
Hi Aimee Gray,  This patient has tried and failed Nurtec (Rimegepant CGRP). Is there a way to appeal the decision?

## 2021-06-15 ENCOUNTER — Encounter (INDEPENDENT_AMBULATORY_CARE_PROVIDER_SITE_OTHER): Payer: Self-pay | Admitting: Family Medicine

## 2021-06-15 ENCOUNTER — Other Ambulatory Visit: Payer: Self-pay

## 2021-06-15 ENCOUNTER — Telehealth (INDEPENDENT_AMBULATORY_CARE_PROVIDER_SITE_OTHER): Payer: BC Managed Care – PPO | Admitting: Family Medicine

## 2021-06-15 DIAGNOSIS — E7849 Other hyperlipidemia: Secondary | ICD-10-CM | POA: Diagnosis not present

## 2021-06-15 DIAGNOSIS — Z6831 Body mass index (BMI) 31.0-31.9, adult: Secondary | ICD-10-CM

## 2021-06-15 DIAGNOSIS — E669 Obesity, unspecified: Secondary | ICD-10-CM | POA: Diagnosis not present

## 2021-06-15 DIAGNOSIS — Z6830 Body mass index (BMI) 30.0-30.9, adult: Secondary | ICD-10-CM

## 2021-06-15 DIAGNOSIS — F419 Anxiety disorder, unspecified: Secondary | ICD-10-CM | POA: Diagnosis not present

## 2021-06-15 DIAGNOSIS — F32A Depression, unspecified: Secondary | ICD-10-CM | POA: Diagnosis not present

## 2021-06-15 MED ORDER — BUPROPION HCL ER (SR) 150 MG PO TB12: 150.0000 mg | ORAL_TABLET | Freq: Every day | ORAL | 1 refills | Status: DC

## 2021-06-15 NOTE — Progress Notes (Signed)
TeleHealth Visit:  Due to the COVID-19 pandemic, this visit was completed with telemedicine (audio/video) technology to reduce patient and provider exposure as well as to preserve personal protective equipment.   Aimee Gray has verbally consented to this TeleHealth visit. The patient is located at home, the provider is located at the [facility] office. The participants in this visit include the listed provider and patient. The visit was conducted today via video.  \ Chief Complaint: OBESITY Aimee Gray is here to discuss her progress with her obesity treatment plan along with follow-up of her obesity related diagnoses. Aimee Gray is on the Category 2 Plan and states she is following her eating plan approximately 90% of the time. Aimee Gray states she is not currently exercising.  Today's visit was #: 26 Starting weight: 156 lbs Starting date: 10/28/2019  Interim History: Pt is not feeling well today- URI, headache, congestion, myalgias, feeling febrile. The last few weeks, otherwise, have been ok. She increased Gabapentin dose recently so went back to original dose. She didn't take her shot of Mounjaro due to GI side effects of the increased dose of Gabapentin. Pt reports weight of 130 lbs today at home (does not think this is accurate). She is still wanting more variety in her diet.  Subjective:   1. Anxiety and depression Pt denies suicidal or homicidal ideations. She is doing well on Wellbutrin, Buspar, and Trintellix.  2. Other hyperlipidemia Pt is doing reasonably well with mindful eating. She is not on meds.  Assessment/Plan:   1. Anxiety and depression Behavior modification techniques were discussed today to help Aimee Gray deal with her anxiety.  Orders and follow up as documented in patient record.   Refill- buPROPion (WELLBUTRIN SR) 150 MG 12 hr tablet; Take 1 tablet (150 mg total) by mouth daily.  Dispense: 90 tablet; Refill: 1  2. Other hyperlipidemia Cardiovascular  risk and specific lipid/LDL goals reviewed.  We discussed several lifestyle modifications today and Aimee Gray will continue to work on diet, exercise and weight loss efforts. Orders and follow up as documented in patient record. Repeat labs in February to re-evaluate. Continue current mindful eating.  Counseling Intensive lifestyle modifications are the first line treatment for this issue. Dietary changes: Increase soluble fiber. Decrease simple carbohydrates. Exercise changes: Moderate to vigorous-intensity aerobic activity 150 minutes per week if tolerated. Lipid-lowering medications: see documented in medical record.  3. Obesity with current BMI of 31.1 Aimee Gray is currently in the action stage of change. As such, her goal is to continue with weight loss efforts. She has agreed to the Category 2 Plan and practicing portion control and making smarter food choices, such as increasing vegetables and decreasing simple carbohydrates.   Exercise goals: All adults should avoid inactivity. Some physical activity is better than none, and adults who participate in any amount of physical activity gain some health benefits.  Behavioral modification strategies: increasing lean protein intake, meal planning and cooking strategies, keeping healthy foods in the home, and planning for success.  Aimee Gray has agreed to follow-up with our clinic in 3 weeks. She was informed of the importance of frequent follow-up visits to maximize her success with intensive lifestyle modifications for her multiple health conditions.  Objective:   VITALS: Per patient if applicable, see vitals. GENERAL: Alert and in no acute distress. CARDIOPULMONARY: No increased WOB. Speaking in clear sentences.  PSYCH: Pleasant and cooperative. Speech normal rate and rhythm. Affect is appropriate. Insight and judgement are appropriate. Attention is focused, linear, and appropriate.  NEURO:  Oriented as arrived to appointment on time with no  prompting.   Lab Results  Component Value Date   CREATININE 0.87 03/08/2021   BUN 8 03/08/2021   NA 139 03/08/2021   K 4.2 03/08/2021   CL 102 03/08/2021   CO2 23 03/08/2021   Lab Results  Component Value Date   ALT 15 03/08/2021   AST 14 03/08/2021   ALKPHOS 48 03/08/2021   BILITOT 0.4 03/08/2021   Lab Results  Component Value Date   HGBA1C 5.2 10/28/2019   Lab Results  Component Value Date   INSULIN 13.9 10/28/2019   Lab Results  Component Value Date   TSH 2.850 10/28/2019   Lab Results  Component Value Date   CHOL 170 03/08/2021   HDL 39 (L) 03/08/2021   LDLCALC 118 (H) 03/08/2021   TRIG 66 03/08/2021   CHOLHDL 4.4 03/08/2021   Lab Results  Component Value Date   VD25OH 32.5 10/28/2019   Lab Results  Component Value Date   WBC 10.4 03/08/2021   HGB 14.1 03/08/2021   HCT 40.5 03/08/2021   MCV 91 03/08/2021   PLT 373 03/08/2021   No results found for: IRON, TIBC, FERRITIN  Attestation Statements:   Reviewed by clinician on day of visit: allergies, medications, problem list, medical history, surgical history, family history, social history, and previous encounter notes.  Coral Ceo, CMA, am acting as transcriptionist for Coralie Common, MD.   I have reviewed the above documentation for accuracy and completeness, and I agree with the above. - Coralie Common, MD

## 2021-06-16 ENCOUNTER — Ambulatory Visit (INDEPENDENT_AMBULATORY_CARE_PROVIDER_SITE_OTHER): Payer: BC Managed Care – PPO | Admitting: Family Medicine

## 2021-06-18 ENCOUNTER — Other Ambulatory Visit: Payer: Self-pay | Admitting: Neurology

## 2021-06-18 MED ORDER — AIMOVIG 140 MG/ML ~~LOC~~ SOAJ: 140.0000 mg | SUBCUTANEOUS | 11 refills | Status: DC

## 2021-06-18 NOTE — Telephone Encounter (Signed)
Spoke with patient, will start her on [name]

## 2021-07-15 ENCOUNTER — Other Ambulatory Visit (INDEPENDENT_AMBULATORY_CARE_PROVIDER_SITE_OTHER): Payer: Self-pay | Admitting: Family Medicine

## 2021-07-15 DIAGNOSIS — G43101 Migraine with aura, not intractable, with status migrainosus: Secondary | ICD-10-CM

## 2021-07-16 ENCOUNTER — Other Ambulatory Visit: Payer: Self-pay

## 2021-07-16 ENCOUNTER — Ambulatory Visit: Payer: BC Managed Care – PPO | Admitting: Plastic Surgery

## 2021-07-16 ENCOUNTER — Encounter: Payer: Self-pay | Admitting: Plastic Surgery

## 2021-07-16 DIAGNOSIS — N62 Hypertrophy of breast: Secondary | ICD-10-CM | POA: Diagnosis not present

## 2021-07-16 DIAGNOSIS — M549 Dorsalgia, unspecified: Secondary | ICD-10-CM | POA: Insufficient documentation

## 2021-07-16 DIAGNOSIS — M542 Cervicalgia: Secondary | ICD-10-CM | POA: Insufficient documentation

## 2021-07-16 DIAGNOSIS — G8929 Other chronic pain: Secondary | ICD-10-CM

## 2021-07-16 DIAGNOSIS — M546 Pain in thoracic spine: Secondary | ICD-10-CM

## 2021-07-16 NOTE — Progress Notes (Signed)
Patient ID: Aimee Gray, female    DOB: 09-30-1970, 51 y.o.   MRN: [id number]   Chief Complaint  Patient presents with   consult   Breast Problem    Mammary Hyperplasia: The patient is a 51 y.o. female with a history of mammary hyperplasia for several years.  She has extremely large breasts causing symptoms that include the following: Back pain in the upper and lower back, including neck pain. She pulls or pins her bra straps to provide better lift and relief of the pressure and pain. She notices relief by holding her breast up manually.  Her shoulder straps cause grooves and pain and pressure that requires padding for relief. Pain medication is sometimes required with motrin and tylenol.  Activities that are hindered by enlarged breasts include: exercise and running.  She has tried supportive clothing as well as fitted bras without improvement.  Her breasts are extremely large and fairly symmetric.  She has hyperpigmentation of the inframammary area on both sides.  The sternal to nipple distance on the right is 31 cm and the left is 31 cm.  The IMF distance is 15 cm.  She is 5 feet tall and weighs 134 pounds.  The BMI = 26.2 kg/m.  Preoperative bra size = M cup. She would like to be a C cup.  The estimated excess breast tissue to be removed at the time of surgery = 300-350 grams on the left and 300-350 grams on the right.  Mammogram history: due.  Family history of breast cancer:  none.  Tobacco use:  quit 15 year ago.   The patient expresses the desire to pursue surgical intervention.  No previous surgeries.  Has two children and breast feed. Lost 30 pounds with the [facility].    Review of Systems  Constitutional:  Positive for activity change. Negative for appetite change.  Eyes: Negative.   Respiratory: Negative.  Negative for chest tightness and shortness of breath.   Cardiovascular: Negative.  Negative for leg swelling.  Gastrointestinal: Negative.    Endocrine: Negative.   Genitourinary: Negative.   Musculoskeletal:  Positive for back pain and neck pain.  Skin:  Positive for rash.  Hematological: Negative.   Psychiatric/Behavioral: Negative.     Past Medical History:  Diagnosis Date   Allergy    Anxiety    Back pain    Depression    Infertility, female    Lactose intolerance    Migraines    Palpitations    PCOS (polycystic ovarian syndrome)     Past Surgical History:  Procedure Laterality Date   CESAREAN SECTION        Current Outpatient Medications:    buPROPion (WELLBUTRIN SR) 150 MG 12 hr tablet, Take 1 tablet (150 mg total) by mouth daily., Disp: 90 tablet, Rfl: 1   busPIRone (BUSPAR) 7.5 MG tablet, Take 1 tablet (7.5 mg total) by mouth 3 (three) times daily as needed (anxiety)., Disp: 90 tablet, Rfl: 1   dexmethylphenidate (FOCALIN XR) 10 MG 24 hr capsule, Take 10 mg by mouth daily., Disp: , Rfl:    Erenumab-aooe ([name]) 140 MG/ML SOAJ, Inject 140 mg into the skin every 30 (thirty) days., Disp: 1.12 mL, Rfl: 11   levonorgestrel (MIRENA) 20 MCG/24HR IUD, 1 each by Intrauterine route once., Disp: , Rfl:    Melatonin 5 MG CAPS, Take 5 mg by mouth., Disp: , Rfl:    ondansetron (ZOFRAN ODT) 4 MG disintegrating tablet, Take 1  tablet (4 mg total) by mouth every 8 (eight) hours as needed for nausea or vomiting., Disp: 30 tablet, Rfl: 0   rizatriptan ([name]-MLT) 10 MG disintegrating tablet, DISSOLVE 1 TAB BY MOUTH AS NEEDED FOR MIGRAINE. MAY REPEAT IN 2 HOURS IF NEEDED. NEEDS OFFICE VISIT, Disp: 10 tablet, Rfl: 6   tirzepatide (MOUNJARO) 2.5 MG/0.5ML Pen, Inject 2.5 mg into the skin once a week. Patient has copay card, Disp: 2 mL, Rfl: 0   Vitamin D, Ergocalciferol, (DRISDOL) 1.25 MG (50000 UNIT) CAPS capsule, Take 1 capsule (50,000 Units total) by mouth every 7 (seven) days., Disp: 4 capsule, Rfl: 0   vortioxetine HBr (TRINTELLIX) 20 MG TABS tablet, Take 1 tablet (20 mg total) by mouth daily., Disp: 90 tablet, Rfl: 1    gabapentin (NEURONTIN) 600 MG tablet, Take 1 tablet (600 mg total) by mouth 3 (three) times daily., Disp: 90 tablet, Rfl: 0   Objective:   Vitals:   07/16/21 0934  BP: 131/90  Pulse: 85  SpO2: 100%    Physical Exam Vitals reviewed.  Constitutional:      Appearance: Normal appearance.  HENT:     Head: Normocephalic and atraumatic.  Cardiovascular:     Rate and Rhythm: Normal rate.     Pulses: Normal pulses.  Pulmonary:     Effort: Pulmonary effort is normal. No respiratory distress.     Breath sounds: No wheezing.  Abdominal:     General: There is no distension.     Palpations: Abdomen is soft.     Tenderness: There is no abdominal tenderness. There is no guarding.  Musculoskeletal:        General: No swelling or deformity.  Skin:    General: Skin is warm.     Capillary Refill: Capillary refill takes less than 2 seconds.     Coloration: Skin is not jaundiced.     Findings: No bruising.  Neurological:     Mental Status: She is alert and oriented to person, place, and time.  Psychiatric:        Mood and Affect: Mood normal.        Behavior: Behavior normal.        Thought Content: Thought content normal.        Judgment: Judgment normal.    Assessment & Plan:  Chronic bilateral thoracic back pain  Neck pain  Symptomatic mammary hypertrophy  The procedure the patient selected and that was best for the patient was discussed. The risk were discussed and include but not limited to the following:  Breast asymmetry, fluid accumulation, firmness of the breast, inability to breast feed, loss of nipple or areola, skin loss, change in skin and nipple sensation, fat necrosis of the breast tissue, bleeding, infection and healing delay.  There are risks of anesthesia and injury to nerves or blood vessels.  Allergic reaction to tape, suture and skin glue are possible.  There will be swelling.  Any of these can lead to the need for revisional surgery.  A breast reduction has potential  to interfere with diagnostic procedures in the future.  This procedure is best done when the breast is fully developed.  Changes in the breast will continue to occur over time: pregnancy, weight gain or weigh loss.    Total time: 45 minutes. This includes time spent with the patient during the visit as well as time spent before and after the visit reviewing the chart, documenting the encounter, ordering pertinent studies and literature for the patient.  Physical therapy:  ordered Mammogram:  ordered [facility]:  completed and requested notes  Recommend bilateral breast reduction with possible liposuction.  Will call when completed with the PT.   Pictures were obtained of the patient and placed in the chart with the patient's or guardian's permission.   Alena Bills Vondell Sowell, DO

## 2021-07-23 ENCOUNTER — Other Ambulatory Visit: Payer: Self-pay

## 2021-07-23 ENCOUNTER — Ambulatory Visit
Admission: RE | Admit: 2021-07-23 | Discharge: 2021-07-23 | Disposition: A | Discharge status: Home / Self Care | Payer: BC Managed Care – PPO | Source: Ambulatory Visit | Attending: Plastic Surgery | Admitting: Plastic Surgery

## 2021-07-23 DIAGNOSIS — N62 Hypertrophy of breast: Secondary | ICD-10-CM

## 2021-07-23 DIAGNOSIS — G8929 Other chronic pain: Secondary | ICD-10-CM

## 2021-07-23 DIAGNOSIS — M542 Cervicalgia: Secondary | ICD-10-CM

## 2021-07-27 ENCOUNTER — Other Ambulatory Visit: Payer: Self-pay | Admitting: Plastic Surgery

## 2021-07-27 DIAGNOSIS — R928 Other abnormal and inconclusive findings on diagnostic imaging of breast: Secondary | ICD-10-CM

## 2021-07-28 ENCOUNTER — Ambulatory Visit (INDEPENDENT_AMBULATORY_CARE_PROVIDER_SITE_OTHER): Payer: BC Managed Care – PPO | Admitting: Family Medicine

## 2021-08-02 ENCOUNTER — Encounter (INDEPENDENT_AMBULATORY_CARE_PROVIDER_SITE_OTHER): Payer: Self-pay | Admitting: Family Medicine

## 2021-08-02 ENCOUNTER — Telehealth (INDEPENDENT_AMBULATORY_CARE_PROVIDER_SITE_OTHER): Payer: BC Managed Care – PPO | Admitting: Family Medicine

## 2021-08-02 DIAGNOSIS — E669 Obesity, unspecified: Secondary | ICD-10-CM

## 2021-08-02 DIAGNOSIS — F908 Attention-deficit hyperactivity disorder, other type: Secondary | ICD-10-CM

## 2021-08-02 DIAGNOSIS — E8881 Metabolic syndrome: Secondary | ICD-10-CM

## 2021-08-02 DIAGNOSIS — Z6826 Body mass index (BMI) 26.0-26.9, adult: Secondary | ICD-10-CM

## 2021-08-02 DIAGNOSIS — E88819 Insulin resistance, unspecified: Secondary | ICD-10-CM

## 2021-08-02 MED ORDER — TIRZEPATIDE 2.5 MG/0.5ML ~~LOC~~ SOAJ: 2.5000 mg | SUBCUTANEOUS | 0 refills | Status: DC

## 2021-08-02 NOTE — Progress Notes (Signed)
?TeleHealth Visit:  ?Due to the COVID-19 pandemic, this visit was completed with telemedicine (audio/video) technology to reduce patient and provider exposure as well as to preserve personal protective equipment.  ? ?Aimee Gray has verbally consented to this TeleHealth visit. The patient is located at home, the provider is located at home. The participants in this visit include the listed provider and patient. The visit was conducted today via MyChart video. ? ?OBESITY ?Aimee Gray is here to discuss her progress with her obesity treatment plan along with follow-up of her obesity related diagnoses.  ? ?Today's date: 08/02/2021 ?Today's visit was # 27 ?Starting weight: 156 lbs ?Starting date: 10/28/19 ?Total weight loss: 18 lbs ?Weight at last in office visit: 138 lbs (26.95 BMI) ?Today's reported weight: 130 lbs  ? 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  She is focusing on protein intake and increasing vegetable intake.  She generally has a homemade breakfast smoothie that contains fair life milk, pea protein, and PB 2.  She snacks on nuts and fruit during the day at school.  She notes that she has carried meals to school in the past and usually ends up throwing them away due to lack of time to eat them.  She generally eats some form of meat at dinner.  She says that she is not a big fan of meat. ?She reports a history of binge eating but denies currently struggling with this. ?She has noticed increased hunger over the past few weeks.  She has been out of Mounjaro. ?We discussed her weight goal and she feels 120 pounds (23 BMI) would be a good weight for her.  I advised that her current weight of 130 pounds would also be a good weight for her to maintain. ? ?Nutrition Plan: the Category 2 Plan and practicing portion control OR ? making smarter food choices, such as increasing vegetables and decreasing simple carbohydrates.  ?Hunger is poorly  controlled. Cravings are poorly controlled.  ?Current exercise:  Generally gets 10,000 steps per day on school days. ?Assessment/Plan:  ?Insulin Resistance ?Assessment ?Aimee Gray has had elevated fasting insulin readings. Goal is HgbA1c < 5.7, fasting insulin closer to 5.   ?She reports  polyphagia.  ?Medication(s): Prescribed Mounjaro but has been out of it for a few weeks.  She denies any side effects with the [facility]. ?Lab Results  ?Component Value Date  ? HGBA1C 5.2 10/28/2019  ? ?Lab Results  ?Component Value Date  ? INSULIN 13.9 10/28/2019  ? ? ?Plan ?Refill Mounjaro at 2.5 mg weekly ? ?2. ADHD ?She reports this is not well controlled currently.  She was started on Focalin last week and she says that her focus is not good.  She said it took her 2 hours to make a grocery list yesterday.  She says Vyvanse controlled her focus well but caused irritability. ? ?Plan: ?Continue Focalin and follow up with provider. ? ?Plan:  ?Obesity: Current BMI 26.95 ?Aimee Gray is currently in the action stage of change. As such, her goal is to continue with weight loss efforts. She has agreed to practicing portion control and making smarter food choices, such as increasing vegetables and decreasing simple carbohydrates.  ? ?Exercise goals:  We discussed possibly adding in to 30-minute walks on Saturday and Sunday and/or doing some resistance training with the 5 and 10 pound weights that she has. ?Continue exercise: walking -10,000 steps per day on school days. ? ?Behavioral modification strategies: increasing lean  protein intake, meal planning and cooking strategies, and better snacking choices. ?We discussed some specific products to try such as [employer], [employer]io bars, and [employer]. ? ?Aimee Gray has agreed to follow-up with our clinic in 1 week.  ? ?No orders of the defined types were placed in this encounter. ? ? ?There are no discontinued medications.  ? ?No orders of the defined types were placed in this encounter. ?    ? ?Objective:  ? ?VITALS: Per patient if applicable, see vitals. ?GENERAL: Alert and in no acute distress. ?CARDIOPULMONARY: No increased WOB. Speaking in clear sentences.  ?PSYCH: Pleasant and cooperative. Speech normal rate and rhythm. Affect is appropriate. Insight and judgement are appropriate. Attention is focused, linear, and appropriate.  ?NEURO: Oriented as arrived to appointment on time with no prompting.  ? ?Lab Results  ?Component Value Date  ? CREATININE 0.87 03/08/2021  ? BUN 8 03/08/2021  ? NA 139 03/08/2021  ? K 4.2 03/08/2021  ? CL 102 03/08/2021  ? CO2 23 03/08/2021  ? ?Lab Results  ?Component Value Date  ? ALT 15 03/08/2021  ? AST 14 03/08/2021  ? ALKPHOS 48 03/08/2021  ? BILITOT 0.4 03/08/2021  ? ?Lab Results  ?Component Value Date  ? HGBA1C 5.2 10/28/2019  ? ?Lab Results  ?Component Value Date  ? INSULIN 13.9 10/28/2019  ? ?Lab Results  ?Component Value Date  ? TSH 2.850 10/28/2019  ? ?Lab Results  ?Component Value Date  ? CHOL 170 03/08/2021  ? HDL 39 (L) 03/08/2021  ? LDLCALC 118 (H) 03/08/2021  ? TRIG 66 03/08/2021  ? CHOLHDL 4.4 03/08/2021  ? ?Lab Results  ?Component Value Date  ? WBC 10.4 03/08/2021  ? HGB 14.1 03/08/2021  ? HCT 40.5 03/08/2021  ? MCV 91 03/08/2021  ? PLT 373 03/08/2021  ? ?No results found for: IRON, TIBC, FERRITIN ?Lab Results  ?Component Value Date  ? VD25OH 32.5 10/28/2019  ? ? ?Attestation Statements:  ? ?Reviewed by clinician on day of visit: allergies, medications, problem list, medical history, surgical history, family history, social history, and previous encounter notes. ? ? ? ?

## 2021-08-03 NOTE — Therapy (Signed)
?OUTPATIENT PHYSICAL THERAPY CERVICAL EVALUATION ? ? ?Patient Name: Aimee Gray ?MRN: [id number] ?DOB:12/27/1970, 51 y.o., female ?Today's Date: 08/05/2021 ? ? PT End of Session - 08/05/21 1026   ? ? Visit Number 1   ? Number of Visits 6   ? Date for PT Re-Evaluation 09/16/21   ? Authorization Type BCBS   ? Progress Note Due on Visit 6   ? PT Start Time [id number]   ? PT Stop Time [id number]   ? PT Time Calculation (min) 45 min   ? Activity Tolerance Patient tolerated treatment well   ? Behavior During Therapy [facility] for tasks assessed/performed   ? ?  ?  ? ?  ? ? ?Past Medical History:  ?Diagnosis Date  ? Allergy   ? Anxiety   ? Back pain   ? Depression   ? Infertility, female   ? Lactose intolerance   ? Migraines   ? Palpitations   ? PCOS (polycystic ovarian syndrome)   ? ?Past Surgical History:  ?Procedure Laterality Date  ? CESAREAN SECTION    ? ?Patient Active Problem List  ? Diagnosis Date Noted  ? Back pain 07/16/2021  ? Neck pain 07/16/2021  ? Symptomatic mammary hypertrophy 07/16/2021  ? Vitamin D deficiency 03/31/2020  ? Insulin resistance 03/31/2020  ? Class 1 obesity with serious comorbidity and body mass index (BMI) of 30.0 to 30.9 in adult 01/22/2020  ? Insomnia 01/07/2019  ? Acute bronchitis 08/10/2016  ? Migraine with aura and with status migrainosus, not intractable 08/10/2016  ? Depression 10/14/2014  ? Generalized anxiety disorder 10/14/2014  ? ? ?PCP: Pcp, No ? ?REFERRING PROVIDER: Wallace Going, DO ? ?REFERRING DIAG: M54.6,G89.29 (ICD-10-CM) - Chronic bilateral thoracic back pain M54.2 (ICD-10-CM) - Neck pain  ? ?THERAPY DIAG: Chronic bilateral thoracic back pain ? ? ?ONSET DATE: Chronic ? ?SUBJECTIVE:                                                                                                                                                                                                        ? ?SUBJECTIVE STATEMENT: ?Relates a long history of upper back and thoracic pain  ? ?PERTINENT HISTORY:   ?Unremarkable  ? ?PAIN:  ?Are you having pain? Yes: NPRS scale: 4/10 ?Pain location: t-spine ?Pain description: ache ?Aggravating factors: lifting and stress ?Relieving factors: rest ? ?PRECAUTIONS: None ? ?WEIGHT BEARING RESTRICTIONS  none ? ?FALLS:  ?Has patient fallen in last 6 months? No ?Number of falls: 0 ? ?LIVING ENVIRONMENT: ?Lives with: lives with their family ?Lives in: House/apartment ? ? ?  OCCUPATION: teacher  ? ?PLOF: Independent ? ?PATIENT GOALS reduce pain and resolve underlying cuses ? ?OBJECTIVE:  ? ?DIAGNOSTIC FINDINGS:  ?None noted ? ?PATIENT SURVEYS:  ?NDI 34% ? ? ?COGNITION: ?Overall cognitive status: Within functional limits for tasks assessed ? ? ?SENSATION: ?WFL ? ?POSTURE:  ?Rounded shoulders ? ?PALPATION: ?TPs noted in upper traps and levators B  ? ?CERVICAL/THORACIC ROM:  ? ?Active ROM A/PROM (deg) ?08/05/2021  ?Flexion WNL  ?Extension WNL  ?Right lateral flexion WNL  ?Left lateral flexion WNL  ?Right rotation WNL  ?Left rotation WNL  ?Right rotation 75%  ?Left rotation 50%  ? (Blank rows = not tested) ? ?UE ROM: WNL throughout ? ?Active ROM Right ?08/05/2021 Left ?08/05/2021  ?Shoulder flexion    ?Shoulder extension    ?Shoulder abduction    ?Shoulder adduction    ?Shoulder extension    ?Shoulder internal rotation    ?Shoulder external rotation    ?Elbow flexion    ?Elbow extension    ?Wrist flexion    ?Wrist extension    ?Wrist ulnar deviation    ?Wrist radial deviation    ?Wrist pronation    ?Wrist supination    ? (Blank rows = not tested) ? ?UE MMT:WNL throughout ? ?MMT Right ?08/05/2021 Left ?08/05/2021  ?Shoulder flexion    ?Shoulder extension    ?Shoulder abduction    ?Shoulder adduction    ?Shoulder extension    ?Shoulder internal rotation    ?Shoulder external rotation    ?Middle trapezius    ?Lower trapezius    ?Elbow flexion    ?Elbow extension    ?Wrist flexion    ?Wrist extension    ?Wrist ulnar deviation    ?Wrist radial deviation    ?Wrist pronation    ?Wrist supination    ?Grip  strength    ? (Blank rows = not tested) ? ?CERVICAL SPECIAL TESTS:  ?N/a ? ? ?FUNCTIONAL TESTS:  ?N/a ? ?PATIENT SURVEYS:  ?NDI 34% ? ?TODAY'S TREATMENT:  ?Eval and HEP ? ? ?PATIENT EDUCATION:  ?Education details: Discussed eval findings, rehab rationale and POC and patient is in agreement  ?Person educated: Patient ?Education method: Explanation, Demonstration, and Handouts ?Education comprehension: verbalized understanding, returned demonstration, and needs further education ? ? ?HOME EXERCISE PROGRAM: ?Access Code: [email or web address] ?URL: [email or web address] ?Date: 08/04/2021 ?Prepared by: Sharlynn Oliphant ? ?Exercises ?Quadruped Full Range Thoracic Rotation with Reach - 2 x daily - 7 x weekly - 1 sets - 10 reps ?Quadruped Thoracic Rotation - Reach Under - 2 x daily - 7 x weekly - 1 sets - 10 reps ?Seated Thoracic Flexion and Extension - 2 x daily - 7 x weekly - 1 sets - 10 reps ? ? ?ASSESSMENT: ? ?CLINICAL IMPRESSION: ?Patient is a 50 y.o. female who was seen today for physical therapy evaluation and treatment for thoracic pain. BUE ROM and strength are WNL, cervical mobility WNL and pain free, mild kyphotic posture noted with mild ROM restriction identified in thoracic rotation B. ? ? ?OBJECTIVE IMPAIRMENTS decreased activity tolerance, decreased mobility, hypomobility, postural dysfunction, and pain.  ? ? ?REHAB POTENTIAL: Good ? ?CLINICAL DECISION MAKING: Stable/uncomplicated ? ?EVALUATION COMPLEXITY: Low ? ? ?GOALS: ?Goals reviewed with patient? Yes ? ?SHORT TERM GOALS: Target date: 08/26/2021 ? ?Patient to demonstrate independence in HEP  ?Baseline: [email or web address] ?Goal status: INITIAL ? ?2.  Patient to understand and demo proper posture, identifying postural dysfunction ?Baseline: Mild thoracic kyphosis ?Goal status: INITIAL ? ? ? ?LONG TERM GOALS: Target date: 09/16/2021 ? ?  2/10 pain at rest ?Baseline: 4/10 pain at rest ?Goal status: INITIAL ? ?2.  Increasse B thoracic rotation to 90% ?Baseline: 75% R, 50% L  rotation ?Goal status: INITIAL ? ?3.  Decrease NDI score to 25% ?Baseline: 34% ?Goal status: INITIAL ? ?PLAN: ?PT FREQUENCY: 1x/week ? ?PT DURATION: 6 weeks ? ?PLANNED INTERVENTIONS: Therapeutic exercises, Therapeutic activity, Neuromuscular re-education, Balance training, Gait training, Patient/Family education, Joint mobilization, Dry Needling, Spinal mobilization, and Manual therapy ? ?PLAN FOR NEXT SESSION: HEP review, thoracic stretching and mobilization, postural training ? ? ?Lanice Shirts, PT ?08/05/2021, 10:28 AM ? ? ? ?  ?

## 2021-08-04 ENCOUNTER — Other Ambulatory Visit: Payer: Self-pay

## 2021-08-04 ENCOUNTER — Ambulatory Visit: Payer: BC Managed Care – PPO | Attending: Plastic Surgery

## 2021-08-04 DIAGNOSIS — G8929 Other chronic pain: Secondary | ICD-10-CM | POA: Diagnosis not present

## 2021-08-04 DIAGNOSIS — R293 Abnormal posture: Secondary | ICD-10-CM | POA: Insufficient documentation

## 2021-08-04 DIAGNOSIS — M542 Cervicalgia: Secondary | ICD-10-CM | POA: Insufficient documentation

## 2021-08-04 DIAGNOSIS — M546 Pain in thoracic spine: Secondary | ICD-10-CM | POA: Insufficient documentation

## 2021-08-04 DIAGNOSIS — N62 Hypertrophy of breast: Secondary | ICD-10-CM | POA: Insufficient documentation

## 2021-08-10 ENCOUNTER — Ambulatory Visit (INDEPENDENT_AMBULATORY_CARE_PROVIDER_SITE_OTHER): Payer: BC Managed Care – PPO | Admitting: Family Medicine

## 2021-08-10 ENCOUNTER — Encounter (INDEPENDENT_AMBULATORY_CARE_PROVIDER_SITE_OTHER): Payer: Self-pay | Admitting: Family Medicine

## 2021-08-10 ENCOUNTER — Other Ambulatory Visit: Payer: Self-pay

## 2021-08-10 VITALS — BP 138/85 | HR 85 | Temp 98.1°F | Ht 60.0 in | Wt 129.0 lb

## 2021-08-10 DIAGNOSIS — E8881 Metabolic syndrome: Secondary | ICD-10-CM | POA: Diagnosis not present

## 2021-08-10 DIAGNOSIS — Z6825 Body mass index (BMI) 25.0-25.9, adult: Secondary | ICD-10-CM

## 2021-08-10 DIAGNOSIS — E669 Obesity, unspecified: Secondary | ICD-10-CM | POA: Diagnosis not present

## 2021-08-10 DIAGNOSIS — F908 Attention-deficit hyperactivity disorder, other type: Secondary | ICD-10-CM | POA: Diagnosis not present

## 2021-08-11 ENCOUNTER — Encounter: Payer: Self-pay | Admitting: Neurology

## 2021-08-12 ENCOUNTER — Ambulatory Visit (INDEPENDENT_AMBULATORY_CARE_PROVIDER_SITE_OTHER): Payer: BC Managed Care – PPO | Admitting: Family Medicine

## 2021-08-12 MED ORDER — TIRZEPATIDE 2.5 MG/0.5ML ~~LOC~~ SOAJ: 2.5000 mg | SUBCUTANEOUS | 0 refills | Status: DC

## 2021-08-12 NOTE — Progress Notes (Signed)
? ? ? ?Chief Complaint:  ? ?OBESITY ?Aimee Gray is here to discuss her progress with her obesity treatment plan along with follow-up of her obesity related diagnoses. Aimee Gray is on practicing portion control and making smarter food choices, such as increasing vegetables and decreasing simple carbohydrates and states she is following her eating plan approximately 85% of the time. Aimee Gray states she is doing 0 minutes 0 times per week. ? ?Today's visit was #: 28 ?Starting weight: 156 lbs ?Starting date: 10/28/2019 ?Today's weight: 129 lbs ?Today's date: 08/10/2021 ?Total lbs lost to date: 58 ?Total lbs lost since last in-office visit: 9 ? ?Interim History: Aimee Gray continues to do well with weight loss. She still struggles with stress eating and emotional eating, and she is feeling somewhat deprived. She is working on increasing her protein, but she doesn't like much meat and she is somewhat of a picky eater. ? ?Subjective:  ? ?1. Insulin resistance ?Aimee Gray is stable on Mounjaro, and her polyphagia has improved. No side effects were noted. ? ?2. Attention deficit hyperactivity disorder (ADHD), other type ?Aimee Gray is working on finding the right medications to help her ADHD. She notes her perfectionistic tendencies often thwart her weight loss efforts, but she feels she is on the right path. ? ?Assessment/Plan:  ? ?1. Insulin resistance ?Aimee Gray will continue Mounjaro 2.5 mg q week, and we will refill for 1 month.  ? ?2. Attention deficit hyperactivity disorder (ADHD), other type ?Aimee Gray will continue with her diet, exercise, and weight loss, and we will continue to follow. ? ?3. Obesity with current BMI of 25.3 ?Aimee Gray is currently in the action stage of change. As such, her goal is to continue with weight loss efforts. She has agreed to the Category 2 Plan.  ? ?Behavioral modification strategies: increasing lean protein intake and meal planning and cooking strategies. ? ?Aimee Gray has agreed to follow-up with our clinic  in 4 weeks. She was informed of the importance of frequent follow-up visits to maximize her success with intensive lifestyle modifications for her multiple health conditions.  ? ?Objective:  ? ?Blood pressure 138/85, pulse 85, temperature 98.1 ?F (36.7 ?C), height 5' (1.524 m), weight 129 lb (58.5 kg), SpO2 97 %. ?Body mass index is 25.19 kg/m?. ? ?General: Cooperative, alert, well developed, in no acute distress. ?HEENT: Conjunctivae and lids unremarkable. ?Cardiovascular: Regular rhythm.  ?Lungs: Normal work of breathing. ?Neurologic: No focal deficits.  ? ?Lab Results  ?Component Value Date  ? CREATININE 0.87 03/08/2021  ? BUN 8 03/08/2021  ? NA 139 03/08/2021  ? K 4.2 03/08/2021  ? CL 102 03/08/2021  ? CO2 23 03/08/2021  ? ?Lab Results  ?Component Value Date  ? ALT 15 03/08/2021  ? AST 14 03/08/2021  ? ALKPHOS 48 03/08/2021  ? BILITOT 0.4 03/08/2021  ? ?Lab Results  ?Component Value Date  ? HGBA1C 5.2 10/28/2019  ? ?Lab Results  ?Component Value Date  ? INSULIN 13.9 10/28/2019  ? ?Lab Results  ?Component Value Date  ? TSH 2.850 10/28/2019  ? ?Lab Results  ?Component Value Date  ? CHOL 170 03/08/2021  ? HDL 39 (L) 03/08/2021  ? LDLCALC 118 (H) 03/08/2021  ? TRIG 66 03/08/2021  ? CHOLHDL 4.4 03/08/2021  ? ?Lab Results  ?Component Value Date  ? VD25OH 32.5 10/28/2019  ? ?Lab Results  ?Component Value Date  ? WBC 10.4 03/08/2021  ? HGB 14.1 03/08/2021  ? HCT 40.5 03/08/2021  ? MCV 91 03/08/2021  ? PLT 373 03/08/2021  ? ?No  results found for: IRON, TIBC, FERRITIN ? ?Attestation Statements:  ? ?Reviewed by clinician on day of visit: allergies, medications, problem list, medical history, surgical history, family history, social history, and previous encounter notes. ? ? ?I, Burt Knack, am acting as transcriptionist for Quillian Quince, MD. ? ?I have reviewed the above documentation for accuracy and completeness, and I agree with the above. -  Quillian Quince, MD ? ? ?

## 2021-08-16 ENCOUNTER — Other Ambulatory Visit: Payer: BC Managed Care – PPO

## 2021-08-17 ENCOUNTER — Other Ambulatory Visit: Payer: Self-pay

## 2021-08-17 ENCOUNTER — Ambulatory Visit
Admission: RE | Admit: 2021-08-17 | Discharge: 2021-08-17 | Disposition: A | Discharge status: Home / Self Care | Payer: BC Managed Care – PPO | Source: Ambulatory Visit | Attending: Plastic Surgery | Admitting: Plastic Surgery

## 2021-08-17 ENCOUNTER — Ambulatory Visit: Payer: BC Managed Care – PPO

## 2021-08-17 DIAGNOSIS — R928 Other abnormal and inconclusive findings on diagnostic imaging of breast: Secondary | ICD-10-CM

## 2021-08-18 ENCOUNTER — Ambulatory Visit: Payer: BC Managed Care – PPO

## 2021-08-18 DIAGNOSIS — M546 Pain in thoracic spine: Secondary | ICD-10-CM

## 2021-08-18 DIAGNOSIS — R293 Abnormal posture: Secondary | ICD-10-CM

## 2021-08-18 NOTE — Therapy (Signed)
?OUTPATIENT PHYSICAL THERAPY TREATMENT NOTE ? ? ?Patient Name: Aimee Gray ?MRN: [id number] ?DOB:June 08, 1970, 51 y.o., female ?Today's Date: 08/18/2021 ? ?PCP: Pcp, No ?REFERRING PROVIDER: Wallace Going, DO ? ? PT End of Session - 08/18/21 1704   ? ? Visit Number 2   ? Number of Visits 6   ? Date for PT Re-Evaluation 09/16/21   ? Authorization Type BCBS   ? Progress Note Due on Visit 6   ? PT Start Time 1700   ? PT Stop Time 1740   ? PT Time Calculation (min) 40 min   ? Activity Tolerance Patient tolerated treatment well   ? Behavior During Therapy [facility] for tasks assessed/performed   ? ?  ?  ? ?  ? ? ?Past Medical History:  ?Diagnosis Date  ? Allergy   ? Anxiety   ? Back pain   ? Depression   ? Infertility, female   ? Lactose intolerance   ? Migraines   ? Palpitations   ? PCOS (polycystic ovarian syndrome)   ? ?Past Surgical History:  ?Procedure Laterality Date  ? CESAREAN SECTION    ? ?Patient Active Problem List  ? Diagnosis Date Noted  ? Back pain 07/16/2021  ? Neck pain 07/16/2021  ? Symptomatic mammary hypertrophy 07/16/2021  ? Vitamin D deficiency 03/31/2020  ? Insulin resistance 03/31/2020  ? Class 1 obesity with serious comorbidity and body mass index (BMI) of 30.0 to 30.9 in adult 01/22/2020  ? Insomnia 01/07/2019  ? Acute bronchitis 08/10/2016  ? Migraine with aura and with status migrainosus, not intractable 08/10/2016  ? Depression 10/14/2014  ? Generalized anxiety disorder 10/14/2014  ? ? ?REFERRING DIAG: M54.6,G89.29 (ICD-10-CM) - Chronic bilateral thoracic back pain M54.2 (ICD-10-CM) - Neck pain  ? ?THERAPY DIAG: Chronic bilateral thoracic back pain ? ? ?PERTINENT HISTORY: Unremarkable  ? ?PRECAUTIONS: None ? ?SUBJECTIVE: Pain and symptoms localized to mid thoracic region over spinous processes. ? ?PAIN:  ?Are you having pain? Yes, 2/10 ? ? ? ? ?OBJECTIVE:  ?  ?DIAGNOSTIC FINDINGS:  ?None noted ?  ?PATIENT SURVEYS:  ?NDI 34% ?  ?  ?COGNITION: ?Overall cognitive status: Within functional limits  for tasks assessed ?  ?  ?SENSATION: ?WFL ?  ?POSTURE:  ?Rounded shoulders ?  ?PALPATION: ?TPs noted in upper traps and levators B   ?  ?CERVICAL/THORACIC ROM:  ?  ?Active ROM A/PROM (deg) ?08/05/2021  ?Flexion WNL  ?Extension WNL  ?Right lateral flexion WNL  ?Left lateral flexion WNL  ?Right rotation WNL  ?Left rotation WNL  ?Right rotation 75%  ?Left rotation 50%  ? (Blank rows = not tested) ?  ?UE ROM: WNL throughout ?  ?Active ROM Right ?08/05/2021 Left ?08/05/2021  ?Shoulder flexion      ?Shoulder extension      ?Shoulder abduction      ?Shoulder adduction      ?Shoulder extension      ?Shoulder internal rotation      ?Shoulder external rotation      ?Elbow flexion      ?Elbow extension      ?Wrist flexion      ?Wrist extension      ?Wrist ulnar deviation      ?Wrist radial deviation      ?Wrist pronation      ?Wrist supination      ? (Blank rows = not tested) ?  ?UE MMT:WNL throughout ?  ?MMT Right ?08/05/2021 Left ?08/05/2021  ?Shoulder flexion      ?  Shoulder extension      ?Shoulder abduction      ?Shoulder adduction      ?Shoulder extension      ?Shoulder internal rotation      ?Shoulder external rotation      ?Middle trapezius      ?Lower trapezius      ?Elbow flexion      ?Elbow extension      ?Wrist flexion      ?Wrist extension      ?Wrist ulnar deviation      ?Wrist radial deviation      ?Wrist pronation      ?Wrist supination      ?Grip strength      ? (Blank rows = not tested) ?  ?CERVICAL SPECIAL TESTS:  ?N/a ?  ?  ?FUNCTIONAL TESTS:  ?N/a ?  ?PATIENT SURVEYS:  ?NDI 34% ?  ?TODAY'S TREATMENT:  ?[facility] Adult PT Treatment:                                                DATE: 08/18/21 ?Therapeutic Exercise: ?UBE L1 3/3 ?Prone Ws 15x ?Prone ext 15x ?Prone flexion 15x ?Prone scapular retraction 15x ?Supine hor abd YTB 15 ?Supine OH flexion YTB 15/15 ?Supine shoulder extension from 90d flexion, YTB 15/15 ?Core exercises of hip tosses, shoulder tosses, chops and Victories, 10 reps with weighted ball  ? ?  ?   ?PATIENT EDUCATION:  ?Education details: Discussed eval findings, rehab rationale and POC and patient is in agreement  ?Person educated: Patient ?Education method: Explanation, Demonstration, and Handouts ?Education comprehension: verbalized understanding, returned demonstration, and needs further education ?  ?  ?HOME EXERCISE PROGRAM: ?Access Code: [email or web address] ?URL: [email or web address] ?Date: 08/04/2021 ?Prepared by: Sharlynn Oliphant ?  ?Exercises ?Quadruped Full Range Thoracic Rotation with Reach - 2 x daily - 7 x weekly - 1 sets - 10 reps ?Quadruped Thoracic Rotation - Reach Under - 2 x daily - 7 x weekly - 1 sets - 10 reps ?Seated Thoracic Flexion and Extension - 2 x daily - 7 x weekly - 1 sets - 10 reps ?  ?  ?ASSESSMENT: ?  ?CLINICAL IMPRESSION: Overall pain minimal, intimated thoracic strengthening and mobilization exercises/activities, added core tasks and rotational exercises. ? ?  ? OBJECTIVE IMPAIRMENTS decreased activity tolerance, decreased mobility, hypomobility, postural dysfunction, and pain.  ?  ?  ?REHAB POTENTIAL: Good ?  ?CLINICAL DECISION MAKING: Stable/uncomplicated ?  ?EVALUATION COMPLEXITY: Low ?  ?  ?GOALS: ?Goals reviewed with patient? Yes ?  ?SHORT TERM GOALS: Target date: 08/26/2021 ?  ?Patient to demonstrate independence in HEP  ?Baseline: [email or web address] ?Goal status: INITIAL ?  ?2.  Patient to understand and demo proper posture, identifying postural dysfunction ?Baseline: Mild thoracic kyphosis ?Goal status: INITIAL ?  ?  ?  ?LONG TERM GOALS: Target date: 09/16/2021 ?  ?2/10 pain at rest ?Baseline: 4/10 pain at rest ?Goal status: INITIAL ?  ?2.  Increasse B thoracic rotation to 90% ?Baseline: 75% R, 50% L rotation ?Goal status: INITIAL ?  ?3.  Decrease NDI score to 25% ?Baseline: 34% ?Goal status: INITIAL ?  ?PLAN: ?PT FREQUENCY: 1x/week ?  ?PT DURATION: 6 weeks ?  ?PLANNED INTERVENTIONS: Therapeutic exercises, Therapeutic activity, Neuromuscular re-education, Balance training, Gait  training, Patient/Family education, Joint mobilization, Dry Needling, Spinal mobilization, and Manual therapy ?  ?PLAN FOR NEXT SESSION:  HEP review, thoracic stretching and mobilization, postural training, core strengthening ? ? ? ? ?Lanice Shirts, PT ?08/18/2021, 5:09 PM ? ?   ?

## 2021-08-24 NOTE — Therapy (Signed)
?OUTPATIENT PHYSICAL THERAPY TREATMENT NOTE ? ? ?Patient Name: Aimee Gray ?MRN: [id number] ?DOB:1970/08/24, 51 y.o., female ?Today's Date: 08/25/2021 ? ?PCP: Pcp, No ?REFERRING PROVIDER: Peggye Form, DO ? ? PT End of Session - 08/25/21 1657   ? ? Visit Number 3   ? Number of Visits 6   ? Date for PT Re-Evaluation 09/16/21   ? Authorization Type BCBS   ? Progress Note Due on Visit 6   ? PT Start Time 1657   ? PT Stop Time 1738   ? PT Time Calculation (min) 41 min   ? Activity Tolerance Patient tolerated treatment well   ? Behavior During Therapy [facility] for tasks assessed/performed   ? ?  ?  ? ?  ? ? ? ?Past Medical History:  ?Diagnosis Date  ? Allergy   ? Anxiety   ? Back pain   ? Depression   ? Infertility, female   ? Lactose intolerance   ? Migraines   ? Palpitations   ? PCOS (polycystic ovarian syndrome)   ? ?Past Surgical History:  ?Procedure Laterality Date  ? CESAREAN SECTION    ? ?Patient Active Problem List  ? Diagnosis Date Noted  ? Back pain 07/16/2021  ? Neck pain 07/16/2021  ? Symptomatic mammary hypertrophy 07/16/2021  ? Vitamin D deficiency 03/31/2020  ? Insulin resistance 03/31/2020  ? Class 1 obesity with serious comorbidity and body mass index (BMI) of 30.0 to 30.9 in adult 01/22/2020  ? Insomnia 01/07/2019  ? Acute bronchitis 08/10/2016  ? Migraine with aura and with status migrainosus, not intractable 08/10/2016  ? Depression 10/14/2014  ? Generalized anxiety disorder 10/14/2014  ? ? ?REFERRING DIAG: M54.6,G89.29 (ICD-10-CM) - Chronic bilateral thoracic back pain M54.2 (ICD-10-CM) - Neck pain  ? ?THERAPY DIAG: Chronic bilateral thoracic back pain ? ? ?PERTINENT HISTORY: Unremarkable  ? ?PRECAUTIONS: None ? ?SUBJECTIVE: Patient reports she is having a migraine today and isn't feeling well. ? ?PAIN:  ?Are you having pain? No, 0/10 ? ? ? ? ?OBJECTIVE:  ?  ?DIAGNOSTIC FINDINGS:  ?None noted ?  ?PATIENT SURVEYS:  ?NDI 34% ?  ?  ?COGNITION: ?Overall cognitive status: Within functional limits for  tasks assessed ?  ?  ?SENSATION: ?WFL ?  ?POSTURE:  ?Rounded shoulders ?  ?PALPATION: ?TPs noted in upper traps and levators B   ?  ?CERVICAL/THORACIC ROM:  ?  ?Active ROM A/PROM (deg) ?08/05/2021  ?Flexion WNL  ?Extension WNL  ?Right lateral flexion WNL  ?Left lateral flexion WNL  ?Right rotation WNL  ?Left rotation WNL  ?Right rotation 75%  ?Left rotation 50%  ? (Blank rows = not tested) ?  ?UE ROM: WNL throughout ?  ?Active ROM Right ?08/05/2021 Left ?08/05/2021  ?Shoulder flexion      ?Shoulder extension      ?Shoulder abduction      ?Shoulder adduction      ?Shoulder extension      ?Shoulder internal rotation      ?Shoulder external rotation      ?Elbow flexion      ?Elbow extension      ?Wrist flexion      ?Wrist extension      ?Wrist ulnar deviation      ?Wrist radial deviation      ?Wrist pronation      ?Wrist supination      ? (Blank rows = not tested) ?  ?UE MMT:WNL throughout ?  ?MMT Right ?08/05/2021 Left ?08/05/2021  ?Shoulder flexion      ?  Shoulder extension      ?Shoulder abduction      ?Shoulder adduction      ?Shoulder extension      ?Shoulder internal rotation      ?Shoulder external rotation      ?Middle trapezius      ?Lower trapezius      ?Elbow flexion      ?Elbow extension      ?Wrist flexion      ?Wrist extension      ?Wrist ulnar deviation      ?Wrist radial deviation      ?Wrist pronation      ?Wrist supination      ?Grip strength      ? (Blank rows = not tested) ?  ?CERVICAL SPECIAL TESTS:  ?N/a ?  ?  ?FUNCTIONAL TESTS:  ?N/a ?  ?PATIENT SURVEYS:  ?NDI 34% ?  ?TODAY'S TREATMENT:  ?[facility]:                                                DATE: 08/25/2021 ?Therapeutic Exercise: ?UBE L1 3/3 ?Rows GTB 2x10 ?Shoulder extension GTB 2x10 ?Standing chin tuck on ball on wall 5" x10 ?Prone Ws 15x ?Prone ext 15x (i) ?Prone flexion 15x (I) ?Prone scapular retraction (T) 15x ?Prone Y scaption lift x15 ?Supine hor abd RTB 15 ?Supine diagonals RTB x10 BIL ?Supine 90/90 isometric hold  3x30" ?Supine 90/90 heel taps 2x10  ?Dead bugs 2x10 ? ? ? ?[facility]                                                DATE: 08/18/21 ?Therapeutic Exercise: ?UBE L1 3/3 ?Prone Ws 15x ?Prone ext 15x ?Prone flexion 15x ?Prone scapular retraction 15x ?Supine hor abd YTB 15 ?Supine OH flexion YTB 15/15 ?Supine shoulder extension from 90d flexion, YTB 15/15 ?Core exercises of hip tosses, shoulder tosses, chops and Victories, 10 reps with weighted ball  ? ?  ?  ?PATIENT EDUCATION:  ?Education details: Discussed eval findings, rehab rationale and POC and patient is in agreement  ?Person educated: Patient ?Education method: Explanation, Demonstration, and Handouts ?Education comprehension: verbalized understanding, returned demonstration, and needs further education ?  ?  ?HOME EXERCISE PROGRAM: ?Access Code: [email or web address] ?URL: [email or web address] ?Date: 08/04/2021 ?Prepared by: Gustavus BryantJeffrey Ziemba ?  ?Exercises ?Quadruped Full Range Thoracic Rotation with Reach - 2 x daily - 7 x weekly - 1 sets - 10 reps ?Quadruped Thoracic Rotation - Reach Under - 2 x daily - 7 x weekly - 1 sets - 10 reps ?Seated Thoracic Flexion and Extension - 2 x daily - 7 x weekly - 1 sets - 10 reps ?  ?  ?ASSESSMENT: ?  ?CLINICAL IMPRESSION:  ?Patient presents to PT with no current upper back pain but has a migraine and overall isn't feeling well today. Session today focused on periscapular and core strengthening. Patient was able to tolerate all prescribed exercises with no adverse effects. Patient continues to benefit from skilled PT services and should be progressed as able to improve functional independence. ? ?  ? OBJECTIVE IMPAIRMENTS decreased activity tolerance, decreased mobility, hypomobility, postural dysfunction, and pain.  ?  ?  ?REHAB POTENTIAL: Good ?  ?  CLINICAL DECISION MAKING: Stable/uncomplicated ?  ?EVALUATION COMPLEXITY: Low ?  ?  ?GOALS: ?Goals reviewed with patient? Yes ?  ?SHORT TERM GOALS: Target date:  08/26/2021 ?  ?Patient to demonstrate independence in HEP  ?Baseline: [email or web address] ?Goal status: INITIAL ?  ?2.  Patient to understand and demo proper posture, identifying postural dysfunction ?Baseline: Mild thoracic kyphosis ?Goal status: INITIAL ?  ?  ?  ?LONG TERM GOALS: Target date: 09/16/2021 ?  ?2/10 pain at rest ?Baseline: 4/10 pain at rest ?Goal status: INITIAL ?  ?2.  Increasse B thoracic rotation to 90% ?Baseline: 75% R, 50% L rotation ?Goal status: INITIAL ?  ?3.  Decrease NDI score to 25% ?Baseline: 34% ?Goal status: INITIAL ?  ?PLAN: ?PT FREQUENCY: 1x/week ?  ?PT DURATION: 6 weeks ?  ?PLANNED INTERVENTIONS: Therapeutic exercises, Therapeutic activity, Neuromuscular re-education, Balance training, Gait training, Patient/Family education, Joint mobilization, Dry Needling, Spinal mobilization, and Manual therapy ?  ?PLAN FOR NEXT SESSION: HEP review, thoracic stretching and mobilization, postural training, core strengthening ? ? ? ? ?Harland German, PTA ?08/25/2021, 5:39 PM ? ?   ?

## 2021-08-25 ENCOUNTER — Ambulatory Visit: Payer: BC Managed Care – PPO | Attending: Plastic Surgery

## 2021-08-25 DIAGNOSIS — M546 Pain in thoracic spine: Secondary | ICD-10-CM | POA: Insufficient documentation

## 2021-08-25 DIAGNOSIS — R293 Abnormal posture: Secondary | ICD-10-CM | POA: Diagnosis present

## 2021-09-06 ENCOUNTER — Encounter (INDEPENDENT_AMBULATORY_CARE_PROVIDER_SITE_OTHER): Payer: Self-pay | Admitting: Family Medicine

## 2021-09-06 ENCOUNTER — Ambulatory Visit (INDEPENDENT_AMBULATORY_CARE_PROVIDER_SITE_OTHER): Payer: BC Managed Care – PPO | Admitting: Family Medicine

## 2021-09-06 VITALS — BP 118/83 | HR 79 | Temp 98.1°F | Ht 60.0 in | Wt 128.0 lb

## 2021-09-06 DIAGNOSIS — F419 Anxiety disorder, unspecified: Secondary | ICD-10-CM

## 2021-09-06 DIAGNOSIS — F32A Depression, unspecified: Secondary | ICD-10-CM

## 2021-09-06 DIAGNOSIS — E8881 Metabolic syndrome: Secondary | ICD-10-CM

## 2021-09-06 DIAGNOSIS — Z6825 Body mass index (BMI) 25.0-25.9, adult: Secondary | ICD-10-CM

## 2021-09-06 DIAGNOSIS — E559 Vitamin D deficiency, unspecified: Secondary | ICD-10-CM | POA: Diagnosis not present

## 2021-09-06 DIAGNOSIS — E669 Obesity, unspecified: Secondary | ICD-10-CM

## 2021-09-06 MED ORDER — BUPROPION HCL ER (SR) 150 MG PO TB12: 150.0000 mg | ORAL_TABLET | Freq: Every day | ORAL | 1 refills | Status: DC

## 2021-09-06 MED ORDER — TIRZEPATIDE 2.5 MG/0.5ML ~~LOC~~ SOAJ: 2.5000 mg | SUBCUTANEOUS | 0 refills | Status: DC

## 2021-09-06 MED ORDER — VORTIOXETINE HBR 20 MG PO TABS: 20.0000 mg | ORAL_TABLET | Freq: Every day | ORAL | 1 refills | Status: DC

## 2021-09-07 ENCOUNTER — Ambulatory Visit: Payer: BC Managed Care – PPO

## 2021-09-07 DIAGNOSIS — M546 Pain in thoracic spine: Secondary | ICD-10-CM

## 2021-09-07 DIAGNOSIS — R293 Abnormal posture: Secondary | ICD-10-CM | POA: Diagnosis not present

## 2021-09-07 NOTE — Therapy (Signed)
?OUTPATIENT PHYSICAL THERAPY TREATMENT NOTE ? ? ?Patient Name: Aimee Gray ?MRN: [id number] ?DOB:02/13/1971, 51 y.o., female ?Today's Date: 09/07/2021 ? ?PCP: Pcp, No ?REFERRING PROVIDER: Peggye Form, DO ? ? PT End of Session - 09/07/21 1614   ? ? Visit Number 4   ? Number of Visits 6   ? Date for PT Re-Evaluation 09/16/21   ? Authorization Type BCBS   ? Progress Note Due on Visit 6   ? PT Start Time 1615   ? PT Stop Time 1655   ? PT Time Calculation (min) 40 min   ? Activity Tolerance Patient tolerated treatment well   ? Behavior During Therapy [facility] for tasks assessed/performed   ? ?  ?  ? ?  ? ? ? ? ?Past Medical History:  ?Diagnosis Date  ? Allergy   ? Anxiety   ? Back pain   ? Depression   ? Infertility, female   ? Lactose intolerance   ? Migraines   ? Palpitations   ? PCOS (polycystic ovarian syndrome)   ? ?Past Surgical History:  ?Procedure Laterality Date  ? CESAREAN SECTION    ? ?Patient Active Problem List  ? Diagnosis Date Noted  ? Back pain 07/16/2021  ? Neck pain 07/16/2021  ? Symptomatic mammary hypertrophy 07/16/2021  ? Vitamin D deficiency 03/31/2020  ? Insulin resistance 03/31/2020  ? Class 1 obesity with serious comorbidity and body mass index (BMI) of 30.0 to 30.9 in adult 01/22/2020  ? Insomnia 01/07/2019  ? Acute bronchitis 08/10/2016  ? Migraine with aura and with status migrainosus, not intractable 08/10/2016  ? Depression 10/14/2014  ? Generalized anxiety disorder 10/14/2014  ? ? ?REFERRING DIAG: M54.6,G89.29 (ICD-10-CM) - Chronic bilateral thoracic back pain M54.2 (ICD-10-CM) - Neck pain  ? ?THERAPY DIAG: Chronic bilateral thoracic back pain ? ? ?PERTINENT HISTORY: Unremarkable  ? ?PRECAUTIONS: None ? ?SUBJECTIVE: Allergies causing continued HA symptoms, essentially no upper back pain noted ? ?PAIN:  ?Are you having pain? No, 0/10 ? ? ? ? ?OBJECTIVE:  ?  ?DIAGNOSTIC FINDINGS:  ?None noted ?  ?PATIENT SURVEYS:  ?NDI 34% ?  ?  ?COGNITION: ?Overall cognitive status: Within functional  limits for tasks assessed ?  ?  ?SENSATION: ?WFL ?  ?POSTURE:  ?Rounded shoulders ?  ?PALPATION: ?TPs noted in upper traps and levators B   ?  ?CERVICAL/THORACIC ROM:  ?  ?Active ROM A/PROM (deg) ?08/05/2021  ?Flexion WNL  ?Extension WNL  ?Right lateral flexion WNL  ?Left lateral flexion WNL  ?Right rotation WNL  ?Left rotation WNL  ?Right rotation 75%  ?Left rotation 50%  ? (Blank rows = not tested) ?  ?UE ROM: WNL throughout ?  ?Active ROM Right ?08/05/2021 Left ?08/05/2021  ?Shoulder flexion      ?Shoulder extension      ?Shoulder abduction      ?Shoulder adduction      ?Shoulder extension      ?Shoulder internal rotation      ?Shoulder external rotation      ?Elbow flexion      ?Elbow extension      ?Wrist flexion      ?Wrist extension      ?Wrist ulnar deviation      ?Wrist radial deviation      ?Wrist pronation      ?Wrist supination      ? (Blank rows = not tested) ?  ?UE MMT:WNL throughout ?  ?MMT Right ?08/05/2021 Left ?08/05/2021  ?Shoulder flexion      ?  Shoulder extension      ?Shoulder abduction      ?Shoulder adduction      ?Shoulder extension      ?Shoulder internal rotation      ?Shoulder external rotation      ?Middle trapezius      ?Lower trapezius      ?Elbow flexion      ?Elbow extension      ?Wrist flexion      ?Wrist extension      ?Wrist ulnar deviation      ?Wrist radial deviation      ?Wrist pronation      ?Wrist supination      ?Grip strength      ? (Blank rows = not tested) ?  ?CERVICAL SPECIAL TESTS:  ?N/a ?  ?  ?FUNCTIONAL TESTS:  ?N/a ?  ?PATIENT SURVEYS:  ?NDI 34% ?  ?[facility]AT[facility]  [facility]                                                DATE: 09/07/21 ?Therapeutic Exercise: ?Nustep 6 min L4 (UBE in use) ?Rows GTB 1x10, alt 10/10 ?Shoulder extension GTB 1x10, alt 10/10 ?PNF D1 Flexion against wall YTB 15/15 ?Prone Ws 20x ?Prone ext 20x (i) ER ?Prone flexion 20x (I) ?Prone scapular retraction (T) 20x ?Prone Y scaption lift x20 ?Supine hor abd RTB 10x B, 10/10alt ?Prone  press x10 ?Quadruped LE extension 10/10 ?Quadruped UE extension 10/10 ?Quadruped UE/LE 10/10 ?Supine 90/90 isometric hold 2x30" ?Supine 90/90 heel taps 2x10  ?Dead bugs 2x10 ? ? ? ?[facility]                                                DATE: 08/[facility]/2021 ?Therapeutic Exercise: ?UBE L1 3/3 ?Rows GTB 2x10 ?Shoulder extension GTB 2x10 ?Standing chin tuck on ball on wall 5" x10 ?Prone Ws 15x ?Prone ext 15x (i) ?Prone flexion 15x (I) ?Prone scapular retraction (T) 15x ?Prone Y scaption lift x15 ?Supine hor abd RTB 15 ?Supine 90/90 isometric hold 3x30" ?Supine 90/90 heel taps 2x10  ?Dead bugs 2x10 ? ? ? ?[facility]                                                DATE: 08/18/21 ?Therapeutic Exercise: ?UBE L1 3/3 ?Prone Ws 15x ?Prone ext 15x ?Prone flexion 15x ?Prone scapular retraction 15x ?Supine hor abd YTB 15 ?Supine OH flexion YTB 15/15 ?Supine shoulder extension from 90d flexion, YTB 15/15 ?Core exercises of hip tosses, shoulder tosses, chops and Victories, 10 reps with weighted ball  ? ?  ?  ?PATIENT EDUCATION:  ?Education details: Discussed eval findings, rehab rationale and POC and patient is in agreement  ?Person educated: Patient ?Education method: Explanation, Demonstration, and Handouts ?Education comprehension: verbalized understanding, returned demonstration, and needs further education ?  ?  ?HOME EXERCISE PROGRAM: ?Access Code: [email or web address] ?URL: [email or web address] ?Date: 08/04/2021 ?Prepared by: Gustavus BryantJeffrey Taylyn Brame ?  ?Exercises ?Quadruped Full Range Thoracic Rotation with Reach - 2 x daily - 7 x weekly - 1 sets -  10 reps ?Quadruped Thoracic Rotation - Reach Under - 2 x daily - 7 x weekly - 1 sets - 10 reps ?Seated Thoracic Flexion and Extension - 2 x daily - 7 x weekly - 1 sets - 10 reps ?  ?  ?ASSESSMENT: ?  ?CLINICAL IMPRESSION: HA symptoms less but still has a mild HA due to allergies.  Upper back pain minimal to none, had some discomfort over the weekend.  Added  additional stabilization tasks as well.   ? ? ?  ? OBJECTIVE IMPAIRMENTS decreased activity tolerance, decreased mobility, hypomobility, postural dysfunction, and pain.  ?  ?  ?REHAB POTENTIAL: Good ?  ?CLINICAL DECISION MAKING: Stable/uncomplicated ?  ?EVALUATION COMPLEXITY: Low ?  ?  ?GOALS: ?Goals reviewed with patient? Yes ?  ?SHORT TERM GOALS: Target date: 08/26/2021 ?  ?Patient to demonstrate independence in HEP  ?Baseline: [email or web address] ?Goal status: INITIAL ?  ?2.  Patient to understand and demo proper posture, identifying postural dysfunction ?Baseline: Mild thoracic kyphosis ?Goal status: INITIAL ?  ?  ?  ?LONG TERM GOALS: Target date: 09/16/2021 ?  ?2/10 pain at rest ?Baseline: 4/10 pain at rest ?Goal status: INITIAL ?  ?2.  Increasse B thoracic rotation to 90% ?Baseline: 75% R, 50% L rotation ?Goal status: INITIAL ?  ?3.  Decrease NDI score to 25% ?Baseline: 34% ?Goal status: INITIAL ?  ?PLAN: ?PT FREQUENCY: 1x/week ?  ?PT DURATION: 6 weeks ?  ?PLANNED INTERVENTIONS: Therapeutic exercises, Therapeutic activity, Neuromuscular re-education, Balance training, Gait training, Patient/Family education, Joint mobilization, Dry Needling, Spinal mobilization, and Manual therapy ?  ?PLAN FOR NEXT SESSION: HEP review, thoracic stretching and mobilization, postural training, core strengthening ? ? ? ? ?Hildred Laser, PT ?09/07/2021, 4:15 PM ? ?   ?

## 2021-09-14 ENCOUNTER — Ambulatory Visit: Payer: BC Managed Care – PPO

## 2021-09-14 DIAGNOSIS — M546 Pain in thoracic spine: Secondary | ICD-10-CM

## 2021-09-14 DIAGNOSIS — R293 Abnormal posture: Secondary | ICD-10-CM

## 2021-09-14 NOTE — Therapy (Signed)
?OUTPATIENT PHYSICAL THERAPY TREATMENT NOTE ? ? ?Patient Name: Aimee ShipperShelley J Gray ?MRN: [id number] ?DOB:28-Feb-1971, 51 y.o., female ?Today's Date: 09/14/2021 ? ?PCP: Pcp, No ?REFERRING PROVIDER: Peggye Formillingham, Claire S, DO ? ? PT End of Session - 09/14/21 1614   ? ? Visit Number 5   ? Number of Visits 6   ? Date for PT Re-Evaluation 09/16/21   ? Authorization Type BCBS   ? Progress Note Due on Visit 6   ? PT Start Time 1615   ? PT Stop Time 1700   ? PT Time Calculation (min) 45 min   ? Activity Tolerance Patient tolerated treatment well   ? [facility] for tasks assessed/performed   ? ?  ?  ? ?  ? ? ? ? ? ?Past Medical History:  ?Diagnosis Date  ? Allergy   ? Anxiety   ? Back pain   ? Depression   ? Infertility, female   ? Lactose intolerance   ? Migraines   ? Palpitations   ? PCOS (polycystic ovarian syndrome)   ? ?Past Surgical History:  ?Procedure Laterality Date  ? CESAREAN SECTION    ? ?Patient Active Problem List  ? Diagnosis Date Noted  ? Back pain 07/16/2021  ? Neck pain 07/16/2021  ? Symptomatic mammary hypertrophy 07/16/2021  ? Vitamin D deficiency 03/31/2020  ? Insulin resistance 03/31/2020  ? Class 1 obesity with serious comorbidity and body mass index (BMI) of 30.0 to 30.9 in adult 01/22/2020  ? Insomnia 01/07/2019  ? Acute bronchitis 08/10/2016  ? Migraine with aura and with status migrainosus, not intractable 08/10/2016  ? Depression 10/14/2014  ? Generalized anxiety disorder 10/14/2014  ? ? ?REFERRING DIAG: M54.6,G89.29 (ICD-10-CM) - Chronic bilateral thoracic back pain M54.2 (ICD-10-CM) - Neck pain  ? ?THERAPY DIAG: Chronic bilateral thoracic back pain ? ? ?PERTINENT HISTORY: Unremarkable  ? ?PRECAUTIONS: None ? ?SUBJECTIVE: Patient reports soreness after last session. Not pain to report today. ? ?PAIN:  ?Are you having pain? No, 0/10 ? ? ? ?OBJECTIVE:  ?  ?DIAGNOSTIC FINDINGS:  ?None noted ?  ?PATIENT SURVEYS:  ?NDI 34% ?  ?  ?COGNITION: ?Overall cognitive status: Within functional limits  for tasks assessed ?  ?  ?SENSATION: ?WFL ?  ?POSTURE:  ?Rounded shoulders ?  ?PALPATION: ?TPs noted in upper traps and levators B   ?  ?CERVICAL/THORACIC ROM:  ?  ?Active ROM A/PROM (deg) ?08/05/2021  ?Flexion WNL  ?Extension WNL  ?Right lateral flexion WNL  ?Left lateral flexion WNL  ?Right rotation WNL  ?Left rotation WNL  ?Right rotation 75%  ?Left rotation 50%  ? (Blank rows = not tested) ?  ?UE ROM: WNL throughout ?  ?Active ROM Right ?08/05/2021 Left ?08/05/2021  ?Shoulder flexion      ?Shoulder extension      ?Shoulder abduction      ?Shoulder adduction      ?Shoulder extension      ?Shoulder internal rotation      ?Shoulder external rotation      ?Elbow flexion      ?Elbow extension      ?Wrist flexion      ?Wrist extension      ?Wrist ulnar deviation      ?Wrist radial deviation      ?Wrist pronation      ?Wrist supination      ? (Blank rows = not tested) ?  ?UE MMT:WNL throughout ?  ?MMT Right ?08/05/2021 Left ?08/05/2021  ?Shoulder flexion      ?  Shoulder extension      ?Shoulder abduction      ?Shoulder adduction      ?Shoulder extension      ?Shoulder internal rotation      ?Shoulder external rotation      ?Middle trapezius      ?Lower trapezius      ?Elbow flexion      ?Elbow extension      ?Wrist flexion      ?Wrist extension      ?Wrist ulnar deviation      ?Wrist radial deviation      ?Wrist pronation      ?Wrist supination      ?Grip strength      ? (Blank rows = not tested) ?  ?CERVICAL SPECIAL TESTS:  ?N/a ?  ?  ?FUNCTIONAL TESTS:  ?N/a ?  ?PATIENT SURVEYS:  ?NDI 34% ?  ?TODAY'S TREATMENT:  ?[facility] Adult PT Treatment:                                                DATE: 09/14/2021 ?Therapeutic Exercise: ?UBE L1 3/3 ?Rows GTB 1x10, alt 10/10 ?Shoulder extension GTB 1x10, alt 10/10 ?PNF D1 Flexion against wall YTB 15/15 ?Prone Ws x10 ?Prone ext 20x (i) ER ?Prone flexion x10 (I) ?Prone scapular retraction (T) x10 ?Prone Y scaption lift x10 ?Bird dogs x10 ?Supine 90/90 isometric hold 2x30" ? ? ?[facility] Adult PT  Treatment:                                                DATE: 09/07/21 ?Therapeutic Exercise: ?Nustep 6 min L4 (UBE in use) ?Rows GTB 1x10, alt 10/10 ?Shoulder extension GTB 1x10, alt 10/10 ?PNF D1 Flexion against wall YTB 15/15 ?Prone Ws 20x ?Prone ext 20x (i) ER ?Prone flexion 20x (I) ?Prone scapular retraction (T) 20x ?Prone Y scaption lift x20 ?Supine hor abd RTB 10x B, 10/10alt ?Prone press x10 ?Quadruped LE extension 10/10 ?Quadruped UE extension 10/10 ?Quadruped UE/LE 10/10 ?Supine 90/90 isometric hold 2x30" ?Supine 90/90 heel taps 2x10  ?Dead bugs 2x10 ? ? ? ?[facility] Adult PT Treatment:                                                DATE: 08/25/2021 ?Therapeutic Exercise: ?UBE L1 3/3 ?Rows GTB 2x10 ?Shoulder extension GTB 2x10 ?Standing chin tuck on ball on wall 5" x10 ?Prone Ws 15x ?Prone ext 15x (i) ?Prone flexion 15x (I) ?Prone scapular retraction (T) 15x ?Prone Y scaption lift x15 ?Supine hor abd RTB 15 ?Supine 90/90 isometric hold 3x30" ?Supine 90/90 heel taps 2x10  ?Dead bugs 2x10 ? ?  ?  ?PATIENT EDUCATION:  ?Education details: Discussed eval findings, rehab rationale and POC and patient is in agreement  ?Person educated: Patient ?Education method: Explanation, Demonstration, and Handouts ?Education comprehension: verbalized understanding, returned demonstration, and needs further education ?  ?  ?HOME EXERCISE PROGRAM: ?Access Code: [email or web address] ?URL: [email or web address] ?Date: 08/04/2021 ?Prepared by: Gustavus Bryant ?  ?Exercises ?Quadruped Full Range Thoracic Rotation with Reach - 2 x daily - 7 x weekly - 1 sets -  10 reps ?Quadruped Thoracic Rotation - Reach Under - 2 x daily - 7 x weekly - 1 sets - 10 reps ?Seated Thoracic Flexion and Extension - 2 x daily - 7 x weekly - 1 sets - 10 reps ?  ?  ?ASSESSMENT: ?  ?CLINICAL IMPRESSION:  ?Patient presents to PT with no current back pain and reports headaches have been less lately. She requested to leave therapy early due to needing to pick up  her son, shortened session today. Session today focused on improving periscapular and core strength. Patient was able to tolerate all prescribed exercises with no adverse effects. Patient continues to benefit from skilled PT services and should be progressed as able to improve functional independence. ? ?  ? OBJECTIVE IMPAIRMENTS decreased activity tolerance, decreased mobility, hypomobility, postural dysfunction, and pain.  ?  ?  ?REHAB POTENTIAL: Good ?  ?CLINICAL DECISION MAKING: Stable/uncomplicated ?  ?EVALUATION COMPLEXITY: Low ?  ?  ?GOALS: ?Goals reviewed with patient? Yes ?  ?SHORT TERM GOALS: Target date: 08/26/2021 ?  ?Patient to demonstrate independence in HEP  ?Baseline: [email or web address] ?Goal status: INITIAL ?  ?2.  Patient to understand and demo proper posture, identifying postural dysfunction ?Baseline: Mild thoracic kyphosis ?Goal status: INITIAL ?  ?  ?  ?LONG TERM GOALS: Target date: 09/16/2021 ?  ?2/10 pain at rest ?Baseline: 4/10 pain at rest ?Goal status: INITIAL ?  ?2.  Increasse B thoracic rotation to 90% ?Baseline: 75% R, 50% L rotation ?Goal status: INITIAL ?  ?3.  Decrease NDI score to 25% ?Baseline: 34% ?Goal status: INITIAL ?  ?PLAN: ?PT FREQUENCY: 1x/week ?  ?PT DURATION: 6 weeks ?  ?PLANNED INTERVENTIONS: Therapeutic exercises, Therapeutic activity, Neuromuscular re-education, Balance training, Gait training, Patient/Family education, Joint mobilization, Dry Needling, Spinal mobilization, and Manual therapy ?  ?PLAN FOR NEXT SESSION: HEP review, thoracic stretching and mobilization, postural training, core strengthening ? ? ? ? ?Harland German, PTA ?09/14/2021, 4:15 PM ? ?   ?

## 2021-09-18 NOTE — Progress Notes (Signed)
Chief Complaint:   OBESITY Aimee Gray is here to discuss her progress with her obesity treatment plan along with follow-up of her obesity related diagnoses. Aimee Gray is on the Category 2 Plan and states she is following her eating plan approximately 90% of the time. Aimee Gray states she hiked for 1 hour for 5 times per week, gardened 2 hours 1 time per week, and did PT for 15 minutes for 1 day per week.   Today's visit was #: 29 Starting weight: 156 lbs Starting date: 10/28/2019 Today's weight: 128 lbs Today's date: 09/06/2021 Total lbs lost to date: 28 Total lbs lost since last in-office visit: 1  Interim History: Aimee Gray returned from a trip to [place] over spring break. She enjoyed not working for the last few weeks. Aimee Gray feels that she is still following her plan most of the time. She got back into eating lunch consistently for which she's eating yogurt and [employer].  Subjective:   1. Insulin resistance Aimee Gray is on Mounjaro 2.5mg  and she denies GI side effects. She did skip a few weeks after missing an appointment in February.  2. Vitamin D deficiency Aimee Gray has not had a vitamin D level since 2021. She notes fatigue and denies nausea, vomiting, or muscle weakness.  3. Anxiety and depression Aimee Gray's symptoms have been well controlled on Trintellix and Wellbutrin. Her mental health is well managed. She denies suicidal or homicidal ideations.  Assessment/Plan:   1. Insulin resistance Aimee Gray agrees to continue taking Mounjaro 2.5mg  and will follow up at the agree upon time.  - tirzepatide [facility]) 2.5 MG/0.5ML Pen; Inject 2.5 mg into the skin once a week. Patient has copay card  Dispense: 2 mL; Refill: 0  2. Vitamin D deficiency We will hold on a prescription until a vitamin D level is obtained at her next appointment.  3. Anxiety and depression Aimee Gray agrees to continue taking Trintellix and Wellbutrin and will follow up at the agreed upon time.  - buPROPion  (WELLBUTRIN SR) 150 MG 12 hr tablet; Take 1 tablet (150 mg total) by mouth daily.  Dispense: 90 tablet; Refill: 1 - vortioxetine HBr (TRINTELLIX) 20 MG TABS tablet; Take 1 tablet (20 mg total) by mouth daily.  Dispense: 90 tablet; Refill: 1  4. Obesity with current BMI of 25.1 Aimee Gray is currently in the action stage of change. As such, her goal is to continue with weight loss efforts. She has agreed to the Category 2 Plan.   Exercise goals: All adults should avoid inactivity. Some physical activity is better than none, and adults who participate in any amount of physical activity gain some health benefits.  Behavioral modification strategies: increasing lean protein intake, meal planning and cooking strategies, keeping healthy foods in the home, and planning for success.  Aimee Gray has agreed to follow-up with our clinic in 4 weeks. She was informed of the importance of frequent follow-up visits to maximize her success with intensive lifestyle modifications for her multiple health conditions.   Objective:   Blood pressure 118/83, pulse 79, temperature 98.1 F (36.7 C), height 5' (1.524 m), weight 128 lb (58.1 kg), SpO2 97 %. Body mass index is 25 kg/m.  General: Cooperative, alert, well developed, in no acute distress. HEENT: Conjunctivae and lids unremarkable. Cardiovascular: Regular rhythm.  Lungs: Normal work of breathing. Neurologic: No focal deficits.   Lab Results  Component Value Date   CREATININE 0.87 03/08/2021   BUN 8 03/08/2021   NA 139 03/08/2021   K 4.2 03/08/2021  CL 102 03/08/2021   CO2 23 03/08/2021   Lab Results  Component Value Date   ALT 15 03/08/2021   AST 14 03/08/2021   ALKPHOS 48 03/08/2021   BILITOT 0.4 03/08/2021   Lab Results  Component Value Date   HGBA1C 5.2 10/28/2019   Lab Results  Component Value Date   INSULIN 13.9 10/28/2019   Lab Results  Component Value Date   TSH 2.850 10/28/2019   Lab Results  Component Value Date   CHOL 170  03/08/2021   HDL 39 (L) 03/08/2021   LDLCALC 118 (H) 03/08/2021   TRIG 66 03/08/2021   CHOLHDL 4.4 03/08/2021   Lab Results  Component Value Date   VD25OH 32.5 10/28/2019   Lab Results  Component Value Date   WBC 10.4 03/08/2021   HGB 14.1 03/08/2021   HCT 40.5 03/08/2021   MCV 91 03/08/2021   PLT 373 03/08/2021   No results found for: IRON, TIBC, FERRITIN  Attestation Statements:   Reviewed by clinician on day of visit: allergies, medications, problem list, medical history, surgical history, family history, social history, and previous encounter notes.  IKirke Corin, CMA, am acting as transcriptionist for Reuben Likes, MD  I have reviewed the above documentation for accuracy and completeness, and I agree with the above. - Reuben Likes, MD

## 2021-09-21 ENCOUNTER — Ambulatory Visit: Payer: BC Managed Care – PPO | Attending: Plastic Surgery

## 2021-09-21 DIAGNOSIS — M546 Pain in thoracic spine: Secondary | ICD-10-CM | POA: Insufficient documentation

## 2021-09-21 DIAGNOSIS — R293 Abnormal posture: Secondary | ICD-10-CM | POA: Insufficient documentation

## 2021-09-21 NOTE — Therapy (Signed)
?OUTPATIENT PHYSICAL THERAPY TREATMENT NOTE/DC SUMMARY ? ? ?Patient Name: NNENNA MEADOR ?MRN: [id number] ?DOB:June 25, 1970, 51 y.o., female ?Today's Date: 09/21/2021 ? ?PCP: Pcp, No ?REFERRING PROVIDER: Wallace Going, DO ?PHYSICAL THERAPY DISCHARGE SUMMARY ? ?Visits from Start of Care: 6 ? ?Current functional level related to goals / functional outcomes: ?Goals met ?  ?Remaining deficits: ?Mild discomfort ?  ?Education / Equipment: ?HEP  ? ?Patient agrees to discharge. Patient goals were met. Patient is being discharged due to being pleased with the current functional level.  ? PT End of Session - 09/21/21 1622   ? ? Visit Number 6   ? Number of Visits 6   ? Date for PT Re-Evaluation 09/16/21   ? Authorization Type BCBS   ? Progress Note Due on Visit 6   ? PT Start Time 1620   ? PT Stop Time 1700   ? PT Time Calculation (min) 40 min   ? Activity Tolerance Patient tolerated treatment well   ? Behavior During Therapy [facility] for tasks assessed/performed   ? ?  ?  ? ?  ? ? ? ? ? ? ?Past Medical History:  ?Diagnosis Date  ? Allergy   ? Anxiety   ? Back pain   ? Depression   ? Infertility, female   ? Lactose intolerance   ? Migraines   ? Palpitations   ? PCOS (polycystic ovarian syndrome)   ? ?Past Surgical History:  ?Procedure Laterality Date  ? CESAREAN SECTION    ? ?Patient Active Problem List  ? Diagnosis Date Noted  ? Back pain 07/16/2021  ? Neck pain 07/16/2021  ? Symptomatic mammary hypertrophy 07/16/2021  ? Vitamin D deficiency 03/31/2020  ? Insulin resistance 03/31/2020  ? Class 1 obesity with serious comorbidity and body mass index (BMI) of 30.0 to 30.9 in adult 01/22/2020  ? Insomnia 01/07/2019  ? Acute bronchitis 08/10/2016  ? Migraine with aura and with status migrainosus, not intractable 08/10/2016  ? Depression 10/14/2014  ? Generalized anxiety disorder 10/14/2014  ? ? ?REFERRING DIAG: M54.6,G89.29 (ICD-10-CM) - Chronic bilateral thoracic back pain M54.2 (ICD-10-CM) - Neck pain  ? ?THERAPY DIAG: Chronic  bilateral thoracic back pain ? ? ?PERTINENT HISTORY: Unremarkable  ? ?PRECAUTIONS: None ? ?SUBJECTIVE: No pain to report at start of session, still remains restricted for performing high intensity cardio and prolonged recreational tasks such a gardening and household tasks.  Has had to modify her sleep positions to get comfortable and often notes tingling through upper back and neck related to gravity's effect on soft tissue load/strain.  Patient has had to modify her lifestyle to accommodate discomfort and physical restrictions of her condition. ? ?PAIN:  ?Are you having pain? No, 0/10 ? ? ? ?OBJECTIVE:  ?  ?DIAGNOSTIC FINDINGS:  ?None noted ?  ?PATIENT SURVEYS:  ?NDI 34% ?  ?  ?COGNITION: ?Overall cognitive status: Within functional limits for tasks assessed ?  ?  ?SENSATION: ?WFL ?  ?POSTURE:  ?Rounded shoulders ?  ?PALPATION: ?TPs noted in upper traps and levators B   ?  ?CERVICAL/THORACIC ROM:  ?  ?Active ROM A/PROM (deg) ?08/05/2021  ?Flexion WNL  ?Extension WNL  ?Right lateral flexion WNL  ?Left lateral flexion WNL  ?Right rotation WNL  ?Left rotation WNL  ?Right rotation 75%  ?Left rotation 50%  ? (Blank rows = not tested) ?  ?UE ROM: WNL throughout ?  ?Active ROM Right ?08/05/2021 Left ?08/05/2021  ?Shoulder flexion      ?Shoulder extension      ?  Shoulder abduction      ?Shoulder adduction      ?Shoulder extension      ?Shoulder internal rotation      ?Shoulder external rotation      ?Elbow flexion      ?Elbow extension      ?Wrist flexion      ?Wrist extension      ?Wrist ulnar deviation      ?Wrist radial deviation      ?Wrist pronation      ?Wrist supination      ? (Blank rows = not tested) ?  ?UE MMT:WNL throughout ?  ?MMT Right ?08/05/2021 Left ?08/05/2021  ?Shoulder flexion      ?Shoulder extension      ?Shoulder abduction      ?Shoulder adduction      ?Shoulder extension      ?Shoulder internal rotation      ?Shoulder external rotation      ?Middle trapezius      ?Lower trapezius      ?Elbow flexion       ?Elbow extension      ?Wrist flexion      ?Wrist extension      ?Wrist ulnar deviation      ?Wrist radial deviation      ?Wrist pronation      ?Wrist supination      ?Grip strength      ? (Blank rows = not tested) ?  ?CERVICAL SPECIAL TESTS:  ?N/a ?  ?  ?FUNCTIONAL TESTS:  ?N/a ?  ?PATIENT SURVEYS:  ?NDI 34% ?  ?TODAY'S TREATMENT:  ?[facility] Adult PT Treatment:                                                DATE: 09/21/21 ?Therapeutic Exercise: ?UBE L2 3/3 ?- Prone Shoulder Horizontal Abduction  - 1 x daily - 7 x weekly - 1 sets - 20 reps ?- Prone Shoulder Extension  - 1 x daily - 7 x weekly - 1 sets - 15 reps ?- Prone W Scapular Retraction  - 1 x daily - 7 x weekly - 1 sets - 15 reps ?- Prone Scapular Retraction Y  - 2 x daily - 7 x weekly - 1 sets - 15 reps - 30s hold ?- Standard Plank  - 2 x daily - 7 x weekly - 1 sets - 2 reps - 30s hold ?- Side Plank on Knees  - 2 x daily - 7 x weekly - 1 sets - 1 reps - 30s hold ?- Supine 90/90 Alternating Heel Touches with Posterior Pelvic Tilt  - 2 x daily - 7 x weekly - 1 sets - 2 reps - 30s hold ? ? ?[facility]:                                                DATE: 09/14/2021 ?Therapeutic Exercise: ?UBE L1 3/3 ?Rows GTB 1x10, alt 10/10 ?Shoulder extension GTB 1x10, alt 10/10 ?PNF D1 Flexion against wall YTB 15/15 ?Prone Ws x10 ?Prone ext 20x (i) ER ?Prone flexion x10 (I) ?Prone scapular retraction (T) x10 ?Prone Y scaption lift x10 ?Bird dogs x10 ?Supine 90/90 isometric hold 2x30" ? ? ?  [facility] Adult PT Treatment:                                                DATE: 09/07/21 ?Therapeutic Exercise: ?Nustep 6 min L4 (UBE in use) ?Rows GTB 1x10, alt 10/10 ?Shoulder extension GTB 1x10, alt 10/10 ?PNF D1 Flexion against wall YTB 15/15 ?Prone Ws 20x ?Prone ext 20x (i) ER ?Prone flexion 20x (I) ?Prone scapular retraction (T) 20x ?Prone Y scaption lift x20 ?Supine hor abd RTB 10x B, 10/10alt ?Prone press x10 ?Quadruped LE extension 10/10 ?Quadruped UE extension 10/10 ?Quadruped  UE/LE 10/10 ?Supine 90/90 isometric hold 2x30" ?Supine 90/90 heel taps 2x10  ?Dead bugs 2x10 ? ? ? ?[facility] Adult PT Treatment:                                                DATE: 08/25/2021 ?Therapeutic Exercise: ?UBE L1 3/3 ?Rows GTB 2x10 ?Shoulder extension GTB 2x10 ?Standing chin tuck on ball on wall 5" x10 ?Prone Ws 15x ?Prone ext 15x (i) ?Prone flexion 15x (I) ?Prone scapular retraction (T) 15x ?Prone Y scaption lift x15 ?Supine hor abd RTB 15 ?Supine 90/90 isometric hold 3x30" ?Supine 90/90 heel taps 2x10  ?Dead bugs 2x10 ? ?  ?  ?PATIENT EDUCATION:  ?Education details: Discussed eval findings, rehab rationale and POC and patient is in agreement  ?Person educated: Patient ?Education method: Explanation, Demonstration, and Handouts ?Education comprehension: verbalized understanding, returned demonstration, and needs further education ?  ?  ?HOME EXERCISE PROGRAM: ?Access Code: [email or web address] ?URL: [email or web address] ?Date: 09/21/2021 ?Prepared by: Sharlynn Oliphant ? ?Exercises ?- Prone Shoulder Horizontal Abduction  - 1 x daily - 7 x weekly - 1 sets - 20 reps ?- Prone Shoulder Extension  - 1 x daily - 7 x weekly - 1 sets - 15 reps ?- Prone W Scapular Retraction  - 1 x daily - 7 x weekly - 1 sets - 15 reps ?- Prone Scapular Retraction Y  - 2 x daily - 7 x weekly - 1 sets - 15 reps - 30s hold ?- Standard Plank  - 2 x daily - 7 x weekly - 1 sets - 2 reps - 30s hold ?- Side Plank on Knees  - 2 x daily - 7 x weekly - 1 sets - 1 reps - 30s hold ?- Supine 90/90 Alternating Heel Touches with Posterior Pelvic Tilt  - 2 x daily - 7 x weekly - 1 sets - 2 reps - 30s hold ?  ?  ?ASSESSMENT: ?  ?CLINICAL IMPRESSION:  ?Rehab goals met, patient ready for independent management ? ?  ? OBJECTIVE IMPAIRMENTS decreased activity tolerance, decreased mobility, hypomobility, postural dysfunction, and pain.  ?  ?  ?REHAB POTENTIAL: Good ?  ?CLINICAL DECISION MAKING: Stable/uncomplicated ?  ?EVALUATION COMPLEXITY: Low ?  ?   ?GOALS: ?Goals reviewed with patient? Yes ?  ?SHORT TERM GOALS: Target date: 08/26/2021 ?  ?Patient to demonstrate independence in HEP  ?Baseline: [email or web address] ?Goal status: Met ?  ?2.  Patient to understand and de

## 2021-09-28 ENCOUNTER — Other Ambulatory Visit (INDEPENDENT_AMBULATORY_CARE_PROVIDER_SITE_OTHER): Payer: Self-pay | Admitting: Family Medicine

## 2021-09-28 ENCOUNTER — Encounter (INDEPENDENT_AMBULATORY_CARE_PROVIDER_SITE_OTHER): Payer: Self-pay

## 2021-09-28 ENCOUNTER — Telehealth (INDEPENDENT_AMBULATORY_CARE_PROVIDER_SITE_OTHER): Payer: Self-pay | Admitting: Family Medicine

## 2021-09-28 DIAGNOSIS — E8881 Metabolic syndrome: Secondary | ICD-10-CM

## 2021-09-28 NOTE — Telephone Encounter (Signed)
Dr. Lawson Radar - Prior authorization approved for [facility]. Patient already uses copay card. Patient sent denial message via mychart.  ?

## 2021-09-28 NOTE — Telephone Encounter (Signed)
Prior authorization started for Mounjaro. Will notify patient and provider once response is received.  ?

## 2021-09-28 NOTE — Telephone Encounter (Signed)
Please check PA

## 2021-09-28 NOTE — Telephone Encounter (Signed)
Dr.Ukleja 

## 2021-10-01 ENCOUNTER — Encounter (INDEPENDENT_AMBULATORY_CARE_PROVIDER_SITE_OTHER): Payer: Self-pay | Admitting: Family Medicine

## 2021-10-04 NOTE — Telephone Encounter (Signed)
Dr.Ukleja 

## 2021-10-06 ENCOUNTER — Encounter (INDEPENDENT_AMBULATORY_CARE_PROVIDER_SITE_OTHER): Payer: Self-pay | Admitting: Family Medicine

## 2021-10-06 ENCOUNTER — Ambulatory Visit: Payer: BC Managed Care – PPO | Admitting: Surgical

## 2021-10-06 ENCOUNTER — Ambulatory Visit (INDEPENDENT_AMBULATORY_CARE_PROVIDER_SITE_OTHER): Payer: BC Managed Care – PPO | Admitting: Family Medicine

## 2021-10-06 VITALS — BP 124/80 | HR 85 | Temp 98.2°F | Ht 60.0 in | Wt 124.0 lb

## 2021-10-06 DIAGNOSIS — R21 Rash and other nonspecific skin eruption: Secondary | ICD-10-CM

## 2021-10-06 DIAGNOSIS — E8881 Metabolic syndrome: Secondary | ICD-10-CM | POA: Diagnosis not present

## 2021-10-06 DIAGNOSIS — E669 Obesity, unspecified: Secondary | ICD-10-CM | POA: Diagnosis not present

## 2021-10-06 DIAGNOSIS — M542 Cervicalgia: Secondary | ICD-10-CM | POA: Diagnosis not present

## 2021-10-06 DIAGNOSIS — G8929 Other chronic pain: Secondary | ICD-10-CM | POA: Diagnosis not present

## 2021-10-06 DIAGNOSIS — M546 Pain in thoracic spine: Secondary | ICD-10-CM

## 2021-10-06 DIAGNOSIS — E7849 Other hyperlipidemia: Secondary | ICD-10-CM | POA: Diagnosis not present

## 2021-10-06 DIAGNOSIS — Z6824 Body mass index (BMI) 24.0-24.9, adult: Secondary | ICD-10-CM

## 2021-10-06 DIAGNOSIS — E559 Vitamin D deficiency, unspecified: Secondary | ICD-10-CM

## 2021-10-06 DIAGNOSIS — N62 Hypertrophy of breast: Secondary | ICD-10-CM | POA: Diagnosis not present

## 2021-10-06 MED ORDER — TIRZEPATIDE 2.5 MG/0.5ML ~~LOC~~ SOAJ: 2.5000 mg | SUBCUTANEOUS | 0 refills | Status: DC

## 2021-10-06 NOTE — Progress Notes (Signed)
? ?  Referring Provider ?No referring provider defined for this encounter.  ? ?CC:  ?Chief Complaint  ?Patient presents with  ? Follow-up  ?   ? ?Aimee Gray is an 51 y.o. female.  ?HPI: Patient is a 51 y.o. year old female here for follow up after completing physical therapy for pain related to macromastia. She reports she has completed 6 visits over 6 weeks of physical therapy, she reports she is still doing the exercises at home but however she is still having upper back, mid back and neck pain.  She also reports she is continuing to have rashes beneath folds of her breasts.  She uses an antifungal powder for this. ? ?She reports that she is still interested in pursuing surgical intervention. ? ?Review of Systems ?MSK: Positive back pain and neck pain ?Skin: Positive rashes ? ?Physical Exam ? ?  10/06/2021  ?  8:00 AM 09/06/2021  ?  3:00 PM 08/10/2021  ?  4:00 PM  ?Vitals with BMI  ?Height 5\' 0"  5\' 0"  5\' 0"   ?Weight 124 lbs 128 lbs 129 lbs  ?BMI 24.22 25 25.19  ?Systolic 124 118  ?Diastolic 80 83 85  ?Pulse 85 79 85  ?  ?General:  No acute distress,  Alert and oriented, Non-Toxic, Normal speech and affect ?Psych: Normal behavior and mood ?  ? ?Assessment/Plan ?Patient is interested in pursuing surgical intervention for bilateral breast reduction. Patient has completed at least 6 weeks of physical therapy for pain related to macromastia.  Patient is a good candidate for bilateral breast reduction. ? ?Discussed with patient we would submit to insurance for authorization, discussed approval could take up to 6 weeks.  ? ?Will route to surgical scheduling team ? ? Aimee Gray ?10/06/2021, 11:43 AM  ? ? ? ? ? ?

## 2021-10-07 LAB — LIPID PANEL WITH LDL/HDL RATIO
Cholesterol, Total: 175 mg/dL (ref 100–199)
HDL: 44 mg/dL (ref >39)
LDL Chol Calc (NIH): 115 mg/dL — ABNORMAL HIGH (ref 0–99)
LDL/HDL Ratio: 2.6 ratio (ref 0.0–3.2)
Triglycerides: 83 mg/dL (ref 0–149)
VLDL Cholesterol Cal: 16 mg/dL (ref 5–40)

## 2021-10-07 LAB — VITAMIN D 25 HYDROXY (VIT D DEFICIENCY, FRACTURES): Vit D, 25-Hydroxy: 32.5 ng/mL (ref 30.0–100.0)

## 2021-10-07 LAB — HEMOGLOBIN A1C
Est. average glucose Bld gHb Est-mCnc: 97 mg/dL
Hgb A1c MFr Bld: 5 % (ref 4.8–5.6)

## 2021-10-07 LAB — INSULIN, RANDOM: INSULIN: 4.3 u[IU]/mL (ref 2.6–24.9)

## 2021-10-11 NOTE — Progress Notes (Signed)
Chief Complaint:   OBESITY Aimee Gray is here to discuss her progress with her obesity treatment plan along with follow-up of her obesity related diagnoses. Aimee Gray is on the Category 2 Plan and states she is following her eating plan approximately 85% of the time. Aimee Gray states she is walking 10,000 steps daily and gardening 12-15 hours per week 5 times per week.  Today's visit was #: 30 Starting weight: 156 lbs Starting date: 10/28/2019 Today's weight: 124 lbs Today's date: 10/06/2021 Total lbs lost to date: 32 lbs Total lbs lost since last in-office visit: 4  Interim History: Aimee Gray is going to see plastic surgeon today.  She has finished her physical therapy.  Her discharge for physical therapy is due to mammary hypertrophy.  She is planning to garden today.  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  Aimee Gray wants to get back on track food wise.   Subjective:   1. Vitamin D deficiency Aimee Gray is on prescription Vitamin D.  She denies any nausea, vomiting, or muscle weakness, but complains of fatigue.  Her last Vitamin D level was 32.5.  2. Other hyperlipidemia Aimee Gray's last LDL was 118, HDL 39 Triglycerides 66.  She is currently not on any medication.   3. Insulin resistance Aimee Gray is doing well on Mounjaro.  Assessment/Plan:   1. Vitamin D deficiency Aimee Gray has agreed to have her Vitamin D level checked today.  Labs obtained.   - VITAMIN D 25 Hydroxy (Vit-D Deficiency, Fractures)  2. Other hyperlipidemia Aimee Gray has agreed to have a FLP checked today.  Labs obtained.   - Lipid Panel With LDL/HDL Ratio  3. Insulin resistance Aimee Gray has agreed to continue [facility].  Refilled today, see below. Also, labs obtained for A1c, Insulin level.  - tirzepatide Aimee Gray) 2.5 MG/0.5ML Pen; Inject 2.5 mg into the skin once a week. Patient has copay card  Dispense: 2 mL; Refill: 0 - Hemoglobin A1c - Insulin, random  4. Obesity  with current BMI of 24.4 Aimee Gray is currently in the action stage of change. As such, her goal is to continue with weight loss efforts. She has agreed to the Category 2 Plan.   Exercise goals: All adults should avoid inactivity. Some physical activity is better than none, and adults who participate in any amount of physical activity gain some health benefits.  Behavioral modification strategies: increasing lean protein intake, meal planning and cooking strategies, and keeping healthy foods in the home.  Aimee Gray has agreed to follow-up with our clinic in 3 weeks. She was informed of the importance of frequent follow-up visits to maximize her success with intensive lifestyle modifications for her multiple health conditions.   Aimee Gray was informed we would discuss her lab results at her next visit unless there is a critical issue that needs to be addressed sooner. Aimee Gray agreed to keep her next visit at the agreed upon time to discuss these results.  Objective:   Blood pressure 124/80, pulse 85, temperature 98.2 F (36.8 C), height 5' (1.524 m), weight 124 lb (56.2 kg), SpO2 99 %. Body mass index is 24.22 kg/m.  General: Cooperative, alert, well developed, in no acute distress. HEENT: Conjunctivae and lids unremarkable. Cardiovascular: Regular rhythm.  Lungs: Normal work of breathing. Neurologic: No focal deficits.   Lab Results  Component Value Date   CREATININE 0.87 03/08/2021   BUN 8 03/08/2021   NA 139 03/08/2021   K 4.2 03/08/2021   CL 102 03/08/2021   CO2 23  03/08/2021   Lab Results  Component Value Date   ALT 15 03/08/2021   AST 14 03/08/2021   ALKPHOS 48 03/08/2021   BILITOT 0.4 03/08/2021   Lab Results  Component Value Date   HGBA1C 5.0 10/06/2021   HGBA1C 5.2 10/28/2019   Lab Results  Component Value Date   INSULIN 4.3 10/06/2021   INSULIN 13.9 10/28/2019   Lab Results  Component Value Date   TSH 2.850 10/28/2019   Lab Results  Component Value Date    CHOL 175 10/06/2021   HDL 44 10/06/2021   LDLCALC 115 (H) 10/06/2021   TRIG 83 10/06/2021   CHOLHDL 4.4 03/08/2021   Lab Results  Component Value Date   VD25OH 32.5 10/06/2021   VD25OH 32.5 10/28/2019   Lab Results  Component Value Date   WBC 10.4 03/08/2021   HGB 14.1 03/08/2021   HCT 40.5 03/08/2021   MCV 91 03/08/2021   PLT 373 03/08/2021   No results found for: IRON, TIBC, FERRITIN   Attestation Statements:   Reviewed by clinician on day of visit: allergies, medications, problem list, medical history, surgical history, family history, social history, and previous encounter notes.  I, Malcolm Metro, RMA, am acting as transcriptionist for Reuben Likes, MD. I have reviewed the above documentation for accuracy and completeness, and I agree with the above. - Reuben Likes, MD

## 2021-10-14 ENCOUNTER — Ambulatory Visit (HOSPITAL_COMMUNITY)
Admission: EM | Admit: 2021-10-14 | Discharge: 2021-10-14 | Disposition: A | Discharge status: Home / Self Care | Payer: BC Managed Care – PPO | Attending: Internal Medicine | Admitting: Internal Medicine

## 2021-10-14 ENCOUNTER — Encounter (HOSPITAL_COMMUNITY): Payer: Self-pay | Admitting: Emergency Medicine

## 2021-10-14 DIAGNOSIS — Z20818 Contact with and (suspected) exposure to other bacterial communicable diseases: Secondary | ICD-10-CM | POA: Insufficient documentation

## 2021-10-14 DIAGNOSIS — J029 Acute pharyngitis, unspecified: Secondary | ICD-10-CM | POA: Insufficient documentation

## 2021-10-14 LAB — POCT RAPID STREP A, ED / UC: Streptococcus, Group A Screen (Direct): NEGATIVE

## 2021-10-14 MED ORDER — AMOXICILLIN 500 MG PO CAPS: 500.0000 mg | ORAL_CAPSULE | Freq: Two times a day (BID) | ORAL | 0 refills | Status: AC

## 2021-10-14 MED ORDER — FLUCONAZOLE 150 MG PO TABS: 150.0000 mg | ORAL_TABLET | Freq: Once | ORAL | 0 refills | Status: AC

## 2021-10-14 NOTE — Discharge Instructions (Signed)
Your rapid strep test was negative but I am still suspicious of strep given the appearance of your throat on exam.  Therefore, you are being treated with amoxicillin antibiotic.  Diflucan has also been sent for you to take at first sign of vaginal yeast given your history of yeast infections with antibiotic therapy.

## 2021-10-14 NOTE — ED Provider Notes (Signed)
[facility]    CSN: [id number] Arrival date & time: 10/14/21  0867      History   Chief Complaint Chief Complaint  Patient presents with   Sore Throat    HPI Aimee Gray is a 51 y.o. female.   Patient presents with sore throat that has been present for approximately 2 days.  Denies any other associated upper respiratory symptoms, cough, fever.  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  Denies chest pain, shortness of breath, ear pain, nausea, vomiting, diarrhea, abdominal pain.   Sore Throat   Past Medical History:  Diagnosis Date   Allergy    Anxiety    Back pain    Depression    Infertility, female    Lactose intolerance    Migraines    Palpitations    PCOS (polycystic ovarian syndrome)     Patient Active Problem List   Diagnosis Date Noted   Back pain 07/16/2021   Neck pain 07/16/2021   Symptomatic mammary hypertrophy 07/16/2021   Vitamin D deficiency 03/31/2020   Insulin resistance 03/31/2020   Class 1 obesity with serious comorbidity and body mass index (BMI) of 30.0 to 30.9 in adult 01/22/2020   Insomnia 01/07/2019   Acute bronchitis 08/10/2016   Migraine with aura and with status migrainosus, not intractable 08/10/2016   Depression 10/14/2014   Generalized anxiety disorder 10/14/2014    Past Surgical History:  Procedure Laterality Date   CESAREAN SECTION      OB History     Gravida  3   Para      Term      Preterm      AB      Living         SAB      IAB      Ectopic      Multiple      Live Births               Home Medications    Prior to Admission medications   Medication Sig Start Date End Date Taking? Authorizing Provider  amoxicillin (AMOXIL) 500 MG capsule Take 1 capsule (500 mg total) by mouth 2 (two) times daily for 10 days. 10/14/21 10/24/21 Yes Yeimy Brabant, Acie Fredrickson, FNP  fluconazole (DIFLUCAN) 150 MG tablet Take 1 tablet (150 mg total) by mouth once  for 1 dose. Take at first sign of vaginal yeast 10/14/21 10/14/21 Yes Etherine Mackowiak, Acie Fredrickson, FNP  buPROPion [facility] SR) 150 MG 12 hr tablet Take 1 tablet (150 mg total) by mouth daily. 09/06/21   Langston Reusing, MD  busPIRone (BUSPAR) 7.5 MG tablet Take 1 tablet (7.5 mg total) by mouth 3 (three) times daily as needed (anxiety). 11/25/20   Langston Reusing, MD  Erenumab-aooe ([name]) 140 MG/ML SOAJ Inject 140 mg into the skin every 30 (thirty) days. 06/18/21   Windell Norfolk, MD  levonorgestrel (MIRENA) 20 MCG/24HR IUD 1 each by Intrauterine route once.    [provider]  Melatonin 5 MG CAPS Take 5 mg by mouth.    [provider]  ondansetron (ZOFRAN ODT) 4 MG disintegrating tablet Take 1 tablet (4 mg total) by mouth every 8 (eight) hours as needed for nausea or vomiting. 03/01/21   Windell Norfolk, MD  rizatriptan ([name]-MLT) 10 MG disintegrating tablet DISSOLVE 1 TAB BY MOUTH AS NEEDED FOR MIGRAINE. MAY REPEAT IN 2 HOURS IF NEEDED. NEEDS OFFICE VISIT 06/07/21  Windell Norfolk, MD  tirzepatide [facility]) 2.5 MG/0.5ML Pen Inject 2.5 mg into the skin once a week. Patient has copay card 10/06/21   Langston Reusing, MD  vortioxetine HBr (TRINTELLIX) 20 MG TABS tablet Take 1 tablet (20 mg total) by mouth daily. 09/06/21   Langston Reusing, MD    Family History Family History  Problem Relation Age of Onset   Heart disease Mother    Hypertension Mother    Thyroid disease Mother    Depression Mother    Anxiety disorder Mother    Sleep apnea Mother    Obesity Mother    Diabetes Father    Heart disease Father    Hyperlipidemia Father    Hypertension Father    Cancer Maternal Grandmother    Diabetes Maternal Grandfather    Stroke Maternal Grandfather    Heart disease Paternal Grandmother    Hypertension Paternal Grandmother    Heart disease Paternal Grandfather    Breast cancer Neg Hx     Social History Social History   Tobacco Use   Smoking status: Former    Smokeless tobacco: Former    Quit date: 2000   Tobacco comments:    quit 15 years ago  Substance Use Topics   Alcohol use: No   Drug use: No     Allergies   Hydrocodone   Review of Systems Review of Systems Per HPI  Physical Exam Triage Vital Signs ED Triage Vitals  Enc Vitals Group     BP 10/14/21 0821 123/82     Pulse Rate 10/14/21 0821 80     Resp 10/14/21 0821 16     Temp 10/14/21 0821 98.1 F (36.7 C)     Temp Source 10/14/21 0821 Oral     SpO2 10/14/21 0821 98 %     Weight 10/14/21 0820 124 lb (56.2 kg)     Height 10/14/21 0820 5' (1.524 m)     Head Circumference --      Peak Flow --      Pain Score 10/14/21 0820 7     Pain Loc --      Pain Edu? --      Excl. in GC? --    No data found.  Updated Vital Signs BP 123/82 (BP Location: Left Arm)   Pulse 80   Temp 98.1 F (36.7 C) (Oral)   Resp 16   Ht 5' (1.524 m)   Wt 124 lb (56.2 kg)   SpO2 98%   BMI 24.22 kg/m   Visual Acuity Right Eye Distance:   Left Eye Distance:   Bilateral Distance:    Right Eye Near:   Left Eye Near:    Bilateral Near:     Physical Exam Constitutional:      General: She is not in acute distress.    Appearance: Normal appearance. She is not toxic-appearing or diaphoretic.  HENT:     Head: Normocephalic and atraumatic.     Right Ear: Tympanic membrane and ear canal normal.     Left Ear: Tympanic membrane and ear canal normal.     Nose: Nose normal.     Mouth/Throat:     Mouth: Mucous membranes are moist.     Pharynx: Posterior oropharyngeal erythema present. No oropharyngeal exudate.     Tonsils: No tonsillar exudate or tonsillar abscesses.  Eyes:     Extraocular Movements: Extraocular movements intact.     Conjunctiva/sclera: Conjunctivae normal.     Pupils: Pupils are equal,  round, and reactive to light.  Cardiovascular:     Rate and Rhythm: Normal rate and regular rhythm.     Pulses: Normal pulses.     Heart sounds: Normal heart sounds.  Pulmonary:      Effort: Pulmonary effort is normal. No respiratory distress.     Breath sounds: Normal breath sounds. No wheezing or rales.  Abdominal:     General: Abdomen is flat. Bowel sounds are normal.     Palpations: Abdomen is soft.  Musculoskeletal:        General: Normal range of motion.     Cervical back: Normal range of motion.  Skin:    General: Skin is warm and dry.  Neurological:     General: No focal deficit present.     Mental Status: She is alert and oriented to person, place, and time. Mental status is at baseline.  Psychiatric:        Mood and Affect: Mood normal.        Behavior: Behavior normal.     UC Treatments / Results  Labs (all labs ordered are listed, but only abnormal results are displayed) Labs Reviewed  CULTURE, GROUP A STREP [facility])  POCT RAPID STREP A, ED / UC    EKG   Radiology No results found.  Procedures Procedures (including critical care time)  Medications Ordered in UC Medications - No data to display  Initial Impression / Assessment and Plan / UC Course  I have reviewed the triage vital signs and the nursing notes.  Pertinent labs & imaging results that were available during my care of the patient were reviewed by me and considered in my medical decision making (see chart for details).     Rapid strep test was negative but I am still highly suspicious of strep throat given appearance of posterior pharynx on exam and patient's close exposure to strep.  Will opt to treat with amoxicillin antibiotic.  Throat culture is pending.  Discussed possibility of viral illness with patient as well.  Discussed supportive care and symptom management.  Discussed strict return precautions.  No signs of peritonsillar abscess on exam.  Patient verbalized understanding and was agreeable with plan.  Patient also requesting Diflucan as antibiotics typically give her yeast infection.  Will send 1 pill of Diflucan to take at first sign of vaginal yeast. Final Clinical  Impressions(s) / UC Diagnoses   Final diagnoses:  Sore throat  Exposure to strep throat     Discharge Instructions      Your rapid strep test was negative but I am still suspicious of strep given the appearance of your throat on exam.  Therefore, you are being treated with amoxicillin antibiotic.  Diflucan has also been sent for you to take at first sign of vaginal yeast given your history of yeast infections with antibiotic therapy.    ED Prescriptions     Medication Sig Dispense Auth. Provider   amoxicillin (AMOXIL) 500 MG capsule Take 1 capsule (500 mg total) by mouth 2 (two) times daily for 10 days. 20 capsule [place], [place], [place]   fluconazole (DIFLUCAN) 150 MG tablet Take 1 tablet (150 mg total) by mouth once for 1 dose. Take at first sign of vaginal yeast 1 tablet [place], Acie FredricksonHaley E, [place]NP      PDMP not reviewed this encounter.   Gustavus [place], Kaide Gage E, [place]FNP 10/14/21 [phone]

## 2021-10-14 NOTE — ED Triage Notes (Signed)
Pt reports sore throat x 2 days. 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 States painful to swallow.

## 2021-10-16 LAB — CULTURE, GROUP A STREP (THRC)

## 2021-10-24 ENCOUNTER — Encounter: Payer: Self-pay | Admitting: Neurology

## 2021-10-25 ENCOUNTER — Telehealth: Payer: Self-pay | Admitting: *Deleted

## 2021-10-25 NOTE — Telephone Encounter (Signed)
PA for [name] 140mg  started on covermymeds (key: [id number]). Pharmacy coverage through [employer]. Approved through 01/25/2022.

## 2021-10-27 ENCOUNTER — Encounter (INDEPENDENT_AMBULATORY_CARE_PROVIDER_SITE_OTHER): Payer: Self-pay | Admitting: Family Medicine

## 2021-10-27 ENCOUNTER — Ambulatory Visit (INDEPENDENT_AMBULATORY_CARE_PROVIDER_SITE_OTHER): Payer: BC Managed Care – PPO | Admitting: Family Medicine

## 2021-10-27 VITALS — BP 125/87 | HR 83 | Temp 97.5°F | Ht 60.0 in | Wt 121.0 lb

## 2021-10-27 DIAGNOSIS — E559 Vitamin D deficiency, unspecified: Secondary | ICD-10-CM

## 2021-10-27 DIAGNOSIS — E8881 Metabolic syndrome: Secondary | ICD-10-CM | POA: Diagnosis not present

## 2021-10-27 DIAGNOSIS — E669 Obesity, unspecified: Secondary | ICD-10-CM | POA: Diagnosis not present

## 2021-10-27 DIAGNOSIS — Z6823 Body mass index (BMI) 23.0-23.9, adult: Secondary | ICD-10-CM

## 2021-10-27 MED ORDER — VITAMIN D (ERGOCALCIFEROL) 1.25 MG (50000 UNIT) PO CAPS: 50000.0000 [IU] | ORAL_CAPSULE | ORAL | 0 refills | Status: DC

## 2021-10-27 MED ORDER — TIRZEPATIDE 2.5 MG/0.5ML ~~LOC~~ SOAJ: 2.5000 mg | SUBCUTANEOUS | 0 refills | Status: DC

## 2021-11-01 NOTE — Progress Notes (Unsigned)
Chief Complaint:   OBESITY Aimee Gray is here to discuss her progress with her obesity treatment plan along with follow-up of her obesity related diagnoses. Aimee Gray is on the Category 2 Plan and states she is following her eating plan approximately 90% of the time. Aimee Gray states she is gardening 20 minutes 5 times per week.  Today's visit was #: 31 Starting weight: 156 lbs Starting date: 10/28/2019 Today's weight: 121 lbs Today's date: 10/27/2021 Total lbs lost to date: 35 lbs Total lbs lost since last in-office visit: 3  Interim History: Aimee Gray just was scheduled for breast reduction today for mid August. She has been following meal plan 90% of time and doing well. She felt nauseous on antibiotic that she was on for strep throat. She has definitely worked on all or nothing mentality in related to food.   Subjective:   1. Vitamin D deficiency Aimee Gray has not been taking Vit D for a bit. She notes fatigue.   2. Insulin resistance Aimee Gray last A1c at 5.0, insulin at 4.3. She is currently on Mounjaro 2.5 mg with good control if intake.   Assessment/Plan:   1. Vitamin D deficiency We will refill Vit D 50,000 IU once a week for 1 month with 0 refills.  -Refill Vitamin D, Ergocalciferol, (DRISDOL) 1.25 MG (50000 UNIT) CAPS capsule; Take 1 capsule (50,000 Units total) by mouth every 7 (seven) days.  Dispense: 4 capsule; Refill: 0  2. Insulin resistance We will refill Mounjaro 2.5 mg subcutaneous once weekly for 1 month with 0 refills.  -Refill tirzepatide (MOUNJARO) 2.5 MG/0.5ML Pen; Inject 2.5 mg into the skin once a week. Patient has copay card  Dispense: 2 mL; Refill: 0  3. Obesity with current BMI of 23.7 Aimee Gray is currently in the action stage of change. As such, her goal is to continue with weight loss efforts. She has agreed to the Category 2 Plan.   Exercise goals: All adults should avoid inactivity. Some physical activity is better than none, and adults who participate in  any amount of physical activity gain some health benefits.  Behavioral modification strategies: increasing lean protein intake, meal planning and cooking strategies, keeping healthy foods in the home, and planning for success.  Aimee Gray has agreed to follow-up with our clinic in 5 weeks. She was informed of the importance of frequent follow-up visits to maximize her success with intensive lifestyle modifications for her multiple health conditions.   Objective:   Blood pressure 125/87, pulse 83, temperature (!) 97.5 F (36.4 C), height 5' (1.524 m), weight 121 lb (54.9 kg), SpO2 97 %. Body mass index is 23.63 kg/m.  General: Cooperative, alert, well developed, in no acute distress. HEENT: Conjunctivae and lids unremarkable. Cardiovascular: Regular rhythm.  Lungs: Normal work of breathing. Neurologic: No focal deficits.   Lab Results  Component Value Date   CREATININE 0.87 03/08/2021   BUN 8 03/08/2021   NA 139 03/08/2021   K 4.2 03/08/2021   CL 102 03/08/2021   CO2 23 03/08/2021   Lab Results  Component Value Date   ALT 15 03/08/2021   AST 14 03/08/2021   ALKPHOS 48 03/08/2021   BILITOT 0.4 03/08/2021   Lab Results  Component Value Date   HGBA1C 5.0 10/06/2021   HGBA1C 5.2 10/28/2019   Lab Results  Component Value Date   INSULIN 4.3 10/06/2021   INSULIN 13.9 10/28/2019   Lab Results  Component Value Date   TSH 2.850 10/28/2019   Lab Results  Component  Value Date   CHOL 175 10/06/2021   HDL 44 10/06/2021   LDLCALC 115 (H) 10/06/2021   TRIG 83 10/06/2021   CHOLHDL 4.4 03/08/2021   Lab Results  Component Value Date   VD25OH 32.5 10/06/2021   VD25OH 32.5 10/28/2019   Lab Results  Component Value Date   WBC 10.4 03/08/2021   HGB 14.1 03/08/2021   HCT 40.5 03/08/2021   MCV 91 03/08/2021   PLT 373 03/08/2021   No results found for: "IRON", "TIBC", "FERRITIN"  Attestation Statements:   Reviewed by clinician on day of visit: allergies, medications,  problem list, medical history, surgical history, family history, social history, and previous encounter notes.  I, Fortino Sic, RMA am acting as transcriptionist for Reuben Likes, MD.  I have reviewed the above documentation for accuracy and completeness, and I agree with the above. -  ***

## 2021-11-29 ENCOUNTER — Encounter (INDEPENDENT_AMBULATORY_CARE_PROVIDER_SITE_OTHER): Payer: Self-pay | Admitting: Family Medicine

## 2021-11-30 ENCOUNTER — Encounter (INDEPENDENT_AMBULATORY_CARE_PROVIDER_SITE_OTHER): Payer: Self-pay | Admitting: Family Medicine

## 2021-11-30 ENCOUNTER — Ambulatory Visit (INDEPENDENT_AMBULATORY_CARE_PROVIDER_SITE_OTHER): Payer: BC Managed Care – PPO | Admitting: Family Medicine

## 2021-11-30 VITALS — BP 133/82 | HR 72 | Temp 97.8°F | Ht 60.0 in | Wt 118.0 lb

## 2021-11-30 DIAGNOSIS — E559 Vitamin D deficiency, unspecified: Secondary | ICD-10-CM

## 2021-11-30 DIAGNOSIS — F419 Anxiety disorder, unspecified: Secondary | ICD-10-CM

## 2021-11-30 DIAGNOSIS — F32A Depression, unspecified: Secondary | ICD-10-CM

## 2021-11-30 DIAGNOSIS — Z6823 Body mass index (BMI) 23.0-23.9, adult: Secondary | ICD-10-CM

## 2021-11-30 DIAGNOSIS — R0602 Shortness of breath: Secondary | ICD-10-CM

## 2021-11-30 DIAGNOSIS — E669 Obesity, unspecified: Secondary | ICD-10-CM | POA: Diagnosis not present

## 2021-11-30 MED ORDER — BUPROPION HCL ER (SR) 150 MG PO TB12: 150.0000 mg | ORAL_TABLET | Freq: Every day | ORAL | 1 refills | Status: DC

## 2021-11-30 MED ORDER — VITAMIN D (ERGOCALCIFEROL) 1.25 MG (50000 UNIT) PO CAPS: 50000.0000 [IU] | ORAL_CAPSULE | ORAL | 0 refills | Status: DC

## 2021-12-01 NOTE — Progress Notes (Signed)
Chief Complaint:   OBESITY Aimee Gray is here to discuss her progress with her obesity treatment plan along with follow-up of her obesity related diagnoses. Aimee Gray is on the Category 2 Plan and states she is following her eating plan approximately 90% of the time. Aimee Gray states she is walking and gardening 30 minutes 5/7 times per week.  Today's visit was #: 32 Starting weight: 156 lbs Starting date: 10/28/2019 Today's weight: 118 lbs Today's date: 11/30/2021 Total lbs lost to date: 38 lbs Total lbs lost since last in-office visit: 3  Interim History: Aimee Gray RTC after 5 weeks. She has been eating more fruit recently due to heat. Has not been as hungry. She is doing quite a bit of yogurt, protein shakes and smoothies.  Subjective:   1. SOBOE (shortness of breath on exertion) Aimee Gray's symptoms has improved since two years ago. Initial IC, 1668.  2. Vitamin D deficiency Aimee Gray is currently taking prescription Vit D 50,000 IU once a week. Denies any nausea, vomiting or muscle weakness. She notes fatigue. Her last Vit D level of 32.5.  3. Anxiety and depression Aimee Gray is currently taking Wellbutrin and Trintellix. Denies suicidal ideas, and homicidal ideas.  Assessment/Plan:   1. SOBOE (shortness of breath on exertion) IC today was 1109-- will continue with Cat 2.  2. Vitamin D deficiency We will refill Vit D 50,000 IU once a week for 1 month with 0 refills.  -Refill Vitamin D, Ergocalciferol, (DRISDOL) 1.25 MG (50000 UNIT) CAPS capsule; Take 1 capsule (50,000 Units total) by mouth every 7 (seven) days.  Dispense: 4 capsule; Refill: 0  3. Anxiety and depression We will refill Wellbutrin Srv 150 mg by mouth daily for 3 months with 0 refills.  -Refill buPROPion (WELLBUTRIN SR) 150 MG 12 hr tablet; Take 1 tablet (150 mg total) by mouth daily.  Dispense: 90 tablet; Refill: 1  4. Obesity with current BMI of 23.1 Aimee Gray is currently in the action stage of change. As such, her  goal is to continue with weight loss efforts. She has agreed to the Category 2 Plan.   Exercise goals: All adults should avoid inactivity. Some physical activity is better than none, and adults who participate in any amount of physical activity gain some health benefits.  Behavioral modification strategies: increasing lean protein intake, meal planning and cooking strategies, keeping healthy foods in the home, and planning for success.  Aimee Gray has agreed to follow-up with our clinic in 4 weeks. She was informed of the importance of frequent follow-up visits to maximize her success with intensive lifestyle modifications for her multiple health conditions.   Objective:   Blood pressure 133/82, pulse 72, temperature 97.8 F (36.6 C), height 5' (1.524 m), weight 118 lb (53.5 kg), SpO2 100 %. Body mass index is 23.05 kg/m.  General: Cooperative, alert, well developed, in no acute distress. HEENT: Conjunctivae and lids unremarkable. Cardiovascular: Regular rhythm.  Lungs: Normal work of breathing. Neurologic: No focal deficits.   Lab Results  Component Value Date   CREATININE 0.87 03/08/2021   BUN 8 03/08/2021   NA 139 03/08/2021   K 4.2 03/08/2021   CL 102 03/08/2021   CO2 23 03/08/2021   Lab Results  Component Value Date   ALT 15 03/08/2021   AST 14 03/08/2021   ALKPHOS 48 03/08/2021   BILITOT 0.4 03/08/2021   Lab Results  Component Value Date   HGBA1C 5.0 10/06/2021   HGBA1C 5.2 10/28/2019   Lab Results  Component Value Date  INSULIN 4.3 10/06/2021   INSULIN 13.9 10/28/2019   Lab Results  Component Value Date   TSH 2.850 10/28/2019   Lab Results  Component Value Date   CHOL 175 10/06/2021   HDL 44 10/06/2021   LDLCALC 115 (H) 10/06/2021   TRIG 83 10/06/2021   CHOLHDL 4.4 03/08/2021   Lab Results  Component Value Date   VD25OH 32.5 10/06/2021   VD25OH 32.5 10/28/2019   Lab Results  Component Value Date   WBC 10.4 03/08/2021   HGB 14.1 03/08/2021   HCT  40.5 03/08/2021   MCV 91 03/08/2021   PLT 373 03/08/2021   No results found for: "IRON", "TIBC", "FERRITIN"  Attestation Statements:   Reviewed by clinician on day of visit: allergies, medications, problem list, medical history, surgical history, family history, social history, and previous encounter notes.  I, Fortino Sic, RMA am acting as transcriptionist for Reuben Likes, MD.  I have reviewed the above documentation for accuracy and completeness, and I agree with the above. - Reuben Likes, MD

## 2021-12-06 ENCOUNTER — Ambulatory Visit: Payer: BC Managed Care – PPO | Admitting: Neurology

## 2021-12-06 ENCOUNTER — Encounter: Payer: Self-pay | Admitting: Neurology

## 2021-12-06 VITALS — BP 117/76 | HR 73 | Ht 60.0 in | Wt 123.5 lb

## 2021-12-06 DIAGNOSIS — G43001 Migraine without aura, not intractable, with status migrainosus: Secondary | ICD-10-CM | POA: Diagnosis not present

## 2021-12-06 NOTE — Progress Notes (Signed)
[facility]  PATIENT: Aimee Gray DOB: Feb 16, 1971  REFERRING CLINICIAN: No ref. provider found HISTORY FROM: Patient  REASON FOR VISIT: Migraines follow up    HISTORICAL  CHIEF COMPLAINT:  Chief Complaint  Patient presents with   Follow-up    Rm 13. Alone. States migraines have improved. C/o heat related migraines.   INTERVAL HISTORY 12/06/2021:  Patient presents today for follow-up, at last visit in January plan was to start Botox treatment but it was not approved.  Since then she has been started on [name] and reports a good improvement in her headaches.  In the past she used to get 1-2 headache per week but currently with [name] she has month without headaches.  She reports recently with the summer heat and working outside in her garden causes some additional headaches but the [name] seems to help.  Patient is satisfied with her current headache frequency   INTERNAL HISTORY 06/07/2021:  Patient present today for follow-up, last visit was in October at that time plan was to start gabapentin as preventive medication since since it has helped patient in the past and to also start Nurtec as abortive medication.  She reported her headache frequency is about the same, she will get 1-2 migraine per week, Nurtec is not effective now she went back to [name].  She was not able to complete MRI brain due to insurance issues. Again for preventive medication patient has tried amitriptyline, topiramate, propanolol and gabapentin.   HISTORY OF PRESENT ILLNESS:  This is a 51 year old woman with past medical history of anxiety depression, insomnia and prediabetes who is presenting for migraines.  Patient stated she has a been diagnosed with migraines since her childhood.  For her migraines, they typically started behind the left eye, sometimes it can switch to the right eye.  Her migraine has been fairly controlled with medication, she report when she was pregnant did not  have any episodes but after giving birth the migraine restarted.  Patient reported lately her migraines have increased in frequency, she is getting about 10 migraine days per month.  Her only medication is [name] and is currently ineffective.  She has tried multiple preventive medications including amitriptyline, topiramate, gabapentin, and propanolol.  For abortive medication she has tried Tylenol, ibuprofen and [name].  Migraines are associated with nausea vomiting, she reported she has to stay in a dark room.  She has a strong family history of migraine including her sister and her 2 nieces.   Headache History and Characteristics: Onset: Since child  Location: Behind left eye but can switch between left and right eye  Quality: Excruciating pain, sometimes can be throbbing  Intensity: 6-8/10.  Duration: 12-24 hrs Migrainous Features: Photophobia, phonophobia, nausea, vomiting.  Aura: No  History of brain injury or tumor: No  Family history: Sister and niece with Migraines  Motion sickness: no Cardiac history: no  OTC: tylenol, Ibuprofen  Caffeine: Yes  Sleep: not great, will get up to 7 hours some night, often time cannot sleep without a sleep aid  Mood/ Stress: Teacher, 2nd grade   Prior prophylaxis: Propranolol: Yes Verapamil:No TCA: Yes Topamax: Yes, brain fog Depakote: No Effexor: No Cymbalta: No Neurontin:Yes   Prior abortives: Triptan: [name] Anti-emetic: No Steroids: No Ergotamine suppository: No   OTHER MEDICAL CONDITIONS: Depression, Insomnia, Anxiety, Pre-diabetes    REVIEW OF SYSTEMS: Full 14 system review of systems performed and negative with exception of: as noted in the HPI  ALLERGIES: Allergies  Allergen Reactions  Hydrocodone Nausea And Vomiting    HOME MEDICATIONS: Outpatient Medications Prior to Visit  Medication Sig Dispense Refill   buPROPion (WELLBUTRIN SR) 150 MG 12 hr tablet Take 1 tablet (150 mg total) by mouth daily. 90 tablet 1    Erenumab-aooe ([name]) 140 MG/ML SOAJ Inject 140 mg into the skin every 30 (thirty) days. 1.12 mL 11   levonorgestrel (MIRENA) 20 MCG/24HR IUD 1 each by Intrauterine route once.     Melatonin 5 MG CAPS Take 5 mg by mouth.     methylphenidate (RITALIN LA) 40 MG 24 hr capsule Take 40 mg by mouth daily.     ondansetron (ZOFRAN ODT) 4 MG disintegrating tablet Take 1 tablet (4 mg total) by mouth every 8 (eight) hours as needed for nausea or vomiting. 30 tablet 0   rizatriptan ([name]-MLT) 10 MG disintegrating tablet DISSOLVE 1 TAB BY MOUTH AS NEEDED FOR MIGRAINE. MAY REPEAT IN 2 HOURS IF NEEDED. NEEDS OFFICE VISIT 10 tablet 6   Vitamin D, Ergocalciferol, (DRISDOL) 1.25 MG (50000 UNIT) CAPS capsule Take 1 capsule (50,000 Units total) by mouth every 7 (seven) days. 4 capsule 0   vortioxetine HBr (TRINTELLIX) 20 MG TABS tablet Take 1 tablet (20 mg total) by mouth daily. 90 tablet 1   No facility-administered medications prior to visit.    PAST MEDICAL HISTORY: Past Medical History:  Diagnosis Date   Allergy    Anxiety    Back pain    Depression    Infertility, female    Lactose intolerance    Migraines    Palpitations    PCOS (polycystic ovarian syndrome)     PAST SURGICAL HISTORY: Past Surgical History:  Procedure Laterality Date   CESAREAN SECTION      FAMILY HISTORY: Family History  Problem Relation Age of Onset   Heart disease Mother    Hypertension Mother    Thyroid disease Mother    Depression Mother    Anxiety disorder Mother    Sleep apnea Mother    Obesity Mother    Diabetes Father    Heart disease Father    Hyperlipidemia Father    Hypertension Father    Cancer Maternal Grandmother    Diabetes Maternal Grandfather    Stroke Maternal Grandfather    Heart disease Paternal Grandmother    Hypertension Paternal Grandmother    Heart disease Paternal Grandfather    Breast cancer Neg Hx     SOCIAL HISTORY: Social History   Socioeconomic History   Marital status:  Married    Spouse name: Not on file   Number of children: Not on file   Years of education: Not on file   Highest education level: Not on file  Occupational History   Not on file  Tobacco Use   Smoking status: Former   Smokeless tobacco: Former    Quit date: 2000   Tobacco comments:    quit 15 years ago  Scientific laboratory technician Use: Not on file  Substance and Sexual Activity   Alcohol use: No   Drug use: No   Sexual activity: Never  Other Topics Concern   Not on file  Social History Narrative   Not on file   Social Determinants of Health   Financial Resource Strain: Not on file  Food Insecurity: Not on file  Transportation Needs: Not on file  Physical Activity: Not on file  Stress: Not on file  Social Connections: Not on file  Intimate Partner Violence: Not on file  PHYSICAL EXAM  GENERAL EXAM/CONSTITUTIONAL: Vitals:  Vitals:   12/06/21 0918  BP: 117/76  Pulse: 73  Weight: 123 lb 8 oz (56 kg)  Height: 5' (1.524 m)   Body mass index is 24.12 kg/m. Wt Readings from Last 3 Encounters:  12/06/21 123 lb 8 oz (56 kg)  11/30/21 118 lb (53.5 kg)  10/27/21 121 lb (54.9 kg)   Patient is in no distress; well developed, nourished and groomed; neck is supple  EYES: Pupils round and reactive to light, Visual fields full to confrontation, Extraocular movements intacts,   MUSCULOSKELETAL: Gait, strength, tone, movements noted in Neurologic exam below  NEUROLOGIC: MENTAL STATUS:      No data to display         awake, alert, oriented to person, place and time recent and remote memory intact normal attention and concentration language fluent, comprehension intact, naming intact fund of knowledge appropriate  CRANIAL NERVE:  2nd, 3rd, 4th, 6th - pupils equal and reactive to light, visual fields full to confrontation, extraocular muscles intact, no nystagmus 5th - facial sensation symmetric 7th - facial strength symmetric 8th - hearing intact 9th - palate  elevates symmetrically, uvula midline 11th - shoulder shrug symmetric 12th - tongue protrusion midline  MOTOR:  normal bulk and tone, full strength in the BUE, BLE  SENSORY:  normal and symmetric to light touch, pinprick, temperature, vibration  COORDINATION:  finger-nose-finger, fine finger movements normal  REFLEXES:  deep tendon reflexes present and symmetric  GAIT/STATION:  normal   DIAGNOSTIC DATA (LABS, IMAGING, TESTING) - I reviewed patient records, labs, notes, testing and imaging myself where available.  Lab Results  Component Value Date   WBC 10.4 03/08/2021   HGB 14.1 03/08/2021   HCT 40.5 03/08/2021   MCV 91 03/08/2021   PLT 373 03/08/2021      Component Value Date/Time   NA 139 03/08/2021 1117   K 4.2 03/08/2021 1117   CL 102 03/08/2021 1117   CO2 23 03/08/2021 1117   GLUCOSE 74 03/08/2021 1117   BUN 8 03/08/2021 1117   CREATININE 0.87 03/08/2021 1117   CALCIUM 9.0 03/08/2021 1117   PROT 6.3 03/08/2021 1117   ALBUMIN 4.7 03/08/2021 1117   AST 14 03/08/2021 1117   ALT 15 03/08/2021 1117   ALKPHOS 48 03/08/2021 1117   BILITOT 0.4 03/08/2021 1117   GFRNONAA 102 10/28/2019 1146   GFRAA 118 10/28/2019 1146   Lab Results  Component Value Date   CHOL 175 10/06/2021   HDL 44 10/06/2021   LDLCALC 115 (H) 10/06/2021   TRIG 83 10/06/2021   CHOLHDL 4.4 03/08/2021   Lab Results  Component Value Date   HGBA1C 5.0 10/06/2021   Lab Results  Component Value Date   VITAMINB12 245 10/28/2019   Lab Results  Component Value Date   TSH 2.850 10/28/2019     ASSESSMENT AND PLAN  51 y.o. year old female with past medical history of anxiety depression, insomnia, prediabetes, ADD and headaches who is presenting for migraine follow up.  Since last visit she has been started on on [name] since Botox was not approved.  With [name] she reported good improvement of her headache frequency.  It went from 1-2 headache per week to 0 headache per month.  At the  moment we will continue [name] and [name] as needed. Follow up in 1 year.    1. Migraine without aura and with status migrainosus, not intractable      Patient Instructions  Continue  with monthly [name] injection Continue with [name] as needed for abortive medication Follow-up with your PCP Return in 1 year    No orders of the defined types were placed in this encounter.   No orders of the defined types were placed in this encounter.   Return in about 1 year (around 12/07/2022).  I have spent a total of 30 minutes dedicated to this patient today, preparing to see patient, performing a medically appropriate examination and evaluation, ordering tests and/or medications and procedures, and counseling and educating the patient/family/caregiver; independently interpreting result and communicating results to the family/patient/caregiver; and documenting clinical information in the electronic medical record.   Windell Norfolk, MD 12/06/2021, 9:44 AM  [facility] [address] [place], [place] [address] [phone]

## 2021-12-06 NOTE — Patient Instructions (Signed)
Continue with monthly [name] injection Continue with [name] as needed for abortive medication Follow-up with your PCP Return in 1 year

## 2021-12-13 NOTE — Progress Notes (Unsigned)
Patient ID: Aimee Gray, female    DOB: 07-Mar-1971, 51 y.o.   MRN: [id number]  No chief complaint on file.   No diagnosis found.   History of Present Illness: Aimee Gray is a 51 y.o.  female  with a history of macromastia.  She presents for preoperative evaluation for upcoming procedure, Bilateral Breast Reduction with possible liposuction, scheduled for 01/05/2022 with Dr.  Ulice Bold  The patient {HAS HAS [id number]} had problems with anesthesia. ***  Summary of Previous Visit: Patient was seen for consult by Dr. Ulice Bold on 07/16/2021.  At this visit, patient reported mammary hyperplasia for several years.  She complained of symptoms including back pain, neck pain grooving of her bra straps and activities and hindered by her enlarged breasts including exercise and running.  Her preoperative bra size was in M-cup.  Patient reported she would like to be a C cup.  Patient also reported she lost 30 pounds with the [facility].  Physical therapy and mammogram were ordered for the patient.  Estimated excess breast tissue to be removed at time of surgery: 300-350 grams bilaterally  Job: ***  PMH Significant for: Migraine, depression, anxiety   Past Medical History: Allergies: Allergies  Allergen Reactions   Hydrocodone Nausea And Vomiting    Current Medications:  Current Outpatient Medications:    buPROPion (WELLBUTRIN SR) 150 MG 12 hr tablet, Take 1 tablet (150 mg total) by mouth daily., Disp: 90 tablet, Rfl: 1   Erenumab-aooe ([name]) 140 MG/ML SOAJ, Inject 140 mg into the skin every 30 (thirty) days., Disp: 1.12 mL, Rfl: 11   levonorgestrel (MIRENA) 20 MCG/24HR IUD, 1 each by Intrauterine route once., Disp: , Rfl:    Melatonin 5 MG CAPS, Take 5 mg by mouth., Disp: , Rfl:    methylphenidate (RITALIN LA) 40 MG 24 hr capsule, Take 40 mg by mouth daily., Disp: , Rfl:    ondansetron (ZOFRAN ODT) 4 MG disintegrating tablet, Take 1 tablet (4 mg  total) by mouth every 8 (eight) hours as needed for nausea or vomiting., Disp: 30 tablet, Rfl: 0   rizatriptan ([name]-MLT) 10 MG disintegrating tablet, DISSOLVE 1 TAB BY MOUTH AS NEEDED FOR MIGRAINE. MAY REPEAT IN 2 HOURS IF NEEDED. NEEDS OFFICE VISIT, Disp: 10 tablet, Rfl: 6   Vitamin D, Ergocalciferol, (DRISDOL) 1.25 MG (50000 UNIT) CAPS capsule, Take 1 capsule (50,000 Units total) by mouth every 7 (seven) days., Disp: 4 capsule, Rfl: 0   vortioxetine HBr (TRINTELLIX) 20 MG TABS tablet, Take 1 tablet (20 mg total) by mouth daily., Disp: 90 tablet, Rfl: 1  Past Medical Problems: Past Medical History:  Diagnosis Date   Allergy    Anxiety    Back pain    Depression    Infertility, female    Lactose intolerance    Migraines    Palpitations    PCOS (polycystic ovarian syndrome)     Past Surgical History: Past Surgical History:  Procedure Laterality Date   CESAREAN SECTION      Social History: Social History   Socioeconomic History   Marital status: Married    Spouse name: Not on file   Number of children: Not on file   Years of education: Not on file   Highest education level: Not on file  Occupational History   Not on file  Tobacco Use   Smoking status: Former   Smokeless tobacco: Former    Quit date: 2000   Tobacco comments:  quit 15 years ago  Vaping Use   Vaping Use: Not on file  Substance and Sexual Activity   Alcohol use: No   Drug use: No   Sexual activity: Never  Other Topics Concern   Not on file  Social History Narrative   Not on file   Social Determinants of Health   Financial Resource Strain: Not on file  Food Insecurity: Not on file  Transportation Needs: Not on file  Physical Activity: Not on file  Stress: Not on file  Social Connections: Not on file  Intimate Partner Violence: Not on file    Family History: Family History  Problem Relation Age of Onset   Heart disease Mother    Hypertension Mother    Thyroid disease Mother     Depression Mother    Anxiety disorder Mother    Sleep apnea Mother    Obesity Mother    Diabetes Father    Heart disease Father    Hyperlipidemia Father    Hypertension Father    Cancer Maternal Grandmother    Diabetes Maternal Grandfather    Stroke Maternal Grandfather    Heart disease Paternal Grandmother    Hypertension Paternal Grandmother    Heart disease Paternal Grandfather    Breast cancer Neg Hx     Review of Systems: ROS  Physical Exam: Vital Signs There were no vitals taken for this visit.  Physical Exam *** Constitutional:      General: Not in acute distress.    Appearance: Normal appearance. Not ill-appearing.  HENT:     Head: Normocephalic and atraumatic.  Eyes:     Pupils: Pupils are equal, round Neck:     Musculoskeletal: Normal range of motion.  Cardiovascular:     Rate and Rhythm: Normal rate    Pulses: Normal pulses.  Pulmonary:     Effort: Pulmonary effort is normal. No respiratory distress.  Musculoskeletal: Normal range of motion.  Skin:    General: Skin is warm and dry.     Findings: No erythema or rash.  Neurological:     General: No focal deficit present.     Mental Status: Alert and oriented to person, place, and time. Mental status is at baseline.     Motor: No weakness.  Psychiatric:        Mood and Affect: Mood normal.        Behavior: Behavior normal.    Assessment/Plan: The patient is scheduled for bilateral breast reduction with possible liposuction with Dr. Ulice Bold.  Risks, benefits, and alternatives of procedure discussed, questions answered and consent obtained.    Smoking Status: ***; Counseling Given? ***  Last Mammogram: Patient had screening mammogram done on 07/23/2021 which showed possible asymmetry with calcifications in the right breast which warranted further evaluation, and no findings suspicious for malignancy in the left breast.;    Patient then underwent diagnostic mammogram on 08/17/2021.  Patient results: No  mammographic evidence of malignancy, BI-RADS Category 1: Negative  Caprini Score: ***; Risk Factors include: ***, BMI *** 25, and length of planned surgery. Recommendation for mechanical *** pharmacological prophylaxis. Encourage early ambulation.   Pictures obtained: @consult ***  Post-op Rx sent to pharmacy: {Blank:[id number]::"Oxycodone, Zofran, Keflex","Oxycodone, Zofran"}  Patient was provided with the breast reduction and General Surgical Risk consent document and Pain Medication Agreement prior to their appointment.  They had adequate time to read through the risk consent documents and Pain Medication Agreement. We also discussed them in person together during this preop appointment. All  of their questions were answered to their satisfaction.  Recommended calling if they have any further questions.  Risk consent form and Pain Medication Agreement to be scanned into patient's chart.  The risk that can be encountered with breast reduction were discussed and include the following but not limited to these:  Breast asymmetry, fluid accumulation, firmness of the breast, inability to breast feed, loss of nipple or areola, skin loss, decrease or no nipple sensation, fat necrosis of the breast tissue, bleeding, infection, healing delay.  There are risks of anesthesia, changes to skin sensation and injury to nerves or blood vessels.  The muscle can be temporarily or permanently injured.  You may have an allergic reaction to tape, suture, glue, blood products which can result in skin discoloration, swelling, pain, skin lesions, poor healing.  Any of these can lead to the need for revisonal surgery or stage procedures.  A reduction has potential to interfere with diagnostic procedures.  Nipple or breast piercing can increase risks of infection.  This procedure is best done when the breast is fully developed.  Changes in the breast will continue to occur over time.  Pregnancy can alter the outcomes of previous breast  reduction surgery, weight gain and weigh loss can also effect the long term appearance.     Electronically signed by: Laurena Spies, PA-C 12/13/2021 2:10 PM

## 2021-12-13 NOTE — H&P (View-Only) (Signed)
Patient ID: Aimee Gray, female    DOB: 1970-09-27, 51 y.o.   MRN: [id number]  Chief Complaint  Patient presents with   Pre-op Exam      ICD-10-CM   1. Symptomatic mammary hypertrophy  N62        History of Present Illness: Aimee Gray is a 51 y.o.  female  with a history of macromastia.  She presents for preoperative evaluation for upcoming procedure, Bilateral Breast Reduction with possible liposuction, scheduled for 01/05/2022 with Dr.  Marla Roe  The patient has not had problems with anesthesia.  Patient does report she has felt nauseous and vomited after taking hydrocodone.  Patient denies any personal or family history of breast cancer.  She denies any history of cardiac disease or use of blood thinners.  Patient states she is not a smoker.  Patient reports she has an IUD.  She denies taking any oral birth control or hormone replacement.  Patient reports she has history of 1 miscarriage.  Patient denies any history or family history of blood clot or clotting disease.  Patient denies any recent surgeries, traumas, infections, pregnancies, strokes or heart attacks.  Patient denies history of inflammatory bowel disease, lung disease or cancer.  Patient states she is currently an M cup And would like to be a C cup.  Summary of Previous Visit: Patient was seen for consult by Dr. Marla Roe on 07/16/2021.  At this visit, patient reported mammary hyperplasia for several years.  She complained of symptoms including back pain, neck pain grooving of her bra straps and activities and hindered by her enlarged breasts including exercise and running.  Her preoperative bra size was in M-cup.  Patient reported she would like to be a C cup.  Patient also reported she lost 30 pounds with the [facility].  The STN on the left was 31 cm and the STN on the right with 31 cm as well.  The IMF distance was 15 cm.  Her BMI was 26.2 kg/m.  Physical therapy and mammogram  were ordered for the patient.  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  Discussed with patient she can submit paperwork to our office to be completed.  Patient acknowledged.  PMH Significant for: Migraine, depression, anxiety, PCOS  Patient reports that she does not have diabetes.  She states that she did have her insulin resistance in the past, but her recent fasting blood glucose levels have been normal and her blood sugar has been controlled with diet and exercise.  Per patient's chart, her most recent hemoglobin A1c was in May and it was 5.0  Past Medical History: Allergies: Allergies  Allergen Reactions   Hydrocodone Nausea And Vomiting    Current Medications:  Current Outpatient Medications:    buPROPion (WELLBUTRIN SR) 150 MG 12 hr tablet, Take 1 tablet (150 mg total) by mouth daily., Disp: 90 tablet, Rfl: 1   cephALEXin (KEFLEX) 500 MG capsule, Take 1 capsule (500 mg total) by mouth 4 (four) times daily for 3 days., Disp: 12 capsule, Rfl: 0   Erenumab-aooe ([name]) 140 MG/ML SOAJ, Inject 140 mg into the skin every 30 (thirty) days., Disp: 1.12 mL, Rfl: 11   levonorgestrel (MIRENA) 20 MCG/24HR IUD, 1 each by Intrauterine route once., Disp: , Rfl:    Melatonin 5 MG CAPS, Take 5 mg by mouth., Disp: , Rfl:  methylphenidate (RITALIN LA) 40 MG 24 hr capsule, Take 40 mg by mouth daily., Disp: , Rfl:    ondansetron (ZOFRAN ODT) 4 MG disintegrating tablet, Take 1 tablet (4 mg total) by mouth every 8 (eight) hours as needed for nausea or vomiting., Disp: 30 tablet, Rfl: 0   ondansetron (ZOFRAN) 4 MG tablet, Take 1 tablet (4 mg total) by mouth every 8 (eight) hours as needed for up to 20 doses for nausea or vomiting., Disp: 20 tablet, Rfl: 0   rizatriptan ([name]-MLT) 10 MG disintegrating tablet, DISSOLVE 1 TAB BY MOUTH AS NEEDED FOR MIGRAINE. MAY REPEAT IN 2 HOURS IF NEEDED. NEEDS OFFICE  VISIT, Disp: 10 tablet, Rfl: 6   traMADol (ULTRAM) 50 MG tablet, Take 1 tablet (50 mg total) by mouth every 8 (eight) hours as needed for up to 20 doses., Disp: 20 tablet, Rfl: 0   Vitamin D, Ergocalciferol, (DRISDOL) 1.25 MG (50000 UNIT) CAPS capsule, Take 1 capsule (50,000 Units total) by mouth every 7 (seven) days., Disp: 4 capsule, Rfl: 0   vortioxetine HBr (TRINTELLIX) 20 MG TABS tablet, Take 1 tablet (20 mg total) by mouth daily., Disp: 90 tablet, Rfl: 1  Past Medical Problems: Past Medical History:  Diagnosis Date   Allergy    Anxiety    Back pain    Depression    Infertility, female    Lactose intolerance    Migraines    Palpitations    PCOS (polycystic ovarian syndrome)     Past Surgical History: Past Surgical History:  Procedure Laterality Date   CESAREAN SECTION      Social History: Social History   Socioeconomic History   Marital status: Married    Spouse name: Not on file   Number of children: Not on file   Years of education: Not on file   Highest education level: Not on file  Occupational History   Not on file  Tobacco Use   Smoking status: Former   Smokeless tobacco: Former    Quit date: 2000   Tobacco comments:    quit 15 years ago  Building services engineer Use: Not on file  Substance and Sexual Activity   Alcohol use: No   Drug use: No   Sexual activity: Never  Other Topics Concern   Not on file  Social History Narrative   Not on file   Social Determinants of Health   Financial Resource Strain: Not on file  Food Insecurity: Not on file  Transportation Needs: Not on file  Physical Activity: Not on file  Stress: Not on file  Social Connections: Not on file  Intimate Partner Violence: Not on file    Family History: Family History  Problem Relation Age of Onset   Heart disease Mother    Hypertension Mother    Thyroid disease Mother    Depression Mother    Anxiety disorder Mother    Sleep apnea Mother    Obesity Mother    Diabetes  Father    Heart disease Father    Hyperlipidemia Father    Hypertension Father    Cancer Maternal Grandmother    Diabetes Maternal Grandfather    Stroke Maternal Grandfather    Heart disease Paternal Grandmother    Hypertension Paternal Grandmother    Heart disease Paternal Grandfather    Breast cancer Neg Hx     Review of Systems: Denies any recent changes in her health  Physical Exam: Vital Signs BP 129/85 (BP Location: Left Arm, Cuff  Size: Small)   Pulse 80   Ht 5' (1.524 m)   Wt 125 lb 12.8 oz (57.1 kg)   SpO2 99%   BMI 24.57 kg/m   Physical Exam  Constitutional:      General: Not in acute distress.    Appearance: Normal appearance. Not ill-appearing.  HENT:     Head: Normocephalic and atraumatic.  Eyes:     Pupils: Pupils are equal, round Neck:     Musculoskeletal: Normal range of motion.  Cardiovascular:     Rate and Rhythm: Normal rate Pulmonary:     Effort: Pulmonary effort is normal. No respiratory distress.  Skin:    General: Skin is warm and dry.     Findings: No erythema or rash.  Neurological:     Mental Status: Alert and oriented to person, place, and time. Mental status is at baseline.  Psychiatric:        Mood and Affect: Mood normal.        Behavior: Behavior normal.    Assessment/Plan: The patient is scheduled for bilateral breast reduction with possible liposuction with Dr. Ulice Bold.  Risks, benefits, and alternatives of procedure discussed, questions answered and consent obtained.    Smoking Status: Non-smoker; Counseling Given?  N/A  Last Mammogram: Patient had screening mammogram done on 07/23/2021 which showed possible asymmetry with calcifications in the right breast which warranted further evaluation, and no findings suspicious for malignancy in the left breast.  Patient then underwent diagnostic mammogram on 08/17/2021.  Patient results: No mammographic evidence of malignancy, BI-RADS Category 1: Negative  Caprini Score: 3; Risk  Factors include: age and length of planned surgery. Recommendation for mechanical prophylaxis. Encourage early ambulation.   Pictures obtained: 07/16/2021  Post-op Rx sent to pharmacy:  Tramadol, Zofran, Keflex  Instructed patient to hold Ritalin 1 week before surgery and to hold the rizatriptan the day of surgery.  Patient acknowledged.  Patient was provided with the breast reduction and General Surgical Risk consent document and Pain Medication Agreement prior to their appointment.  They had adequate time to read through the risk consent documents and Pain Medication Agreement. We also discussed them in person together during this preop appointment. All of their questions were answered to their satisfaction.  Recommended calling if they have any further questions.  Risk consent form and Pain Medication Agreement to be scanned into patient's chart.  The risk that can be encountered with breast reduction were discussed and include the following but not limited to these:  Breast asymmetry, fluid accumulation, firmness of the breast, inability to breast feed, loss of nipple or areola, skin loss, decrease or no nipple sensation, fat necrosis of the breast tissue, bleeding, infection, healing delay.  There are risks of anesthesia, changes to skin sensation and injury to nerves or blood vessels.  The muscle can be temporarily or permanently injured.  You may have an allergic reaction to tape, suture, glue, blood products which can result in skin discoloration, swelling, pain, skin lesions, poor healing.  Any of these can lead to the need for revisonal surgery or stage procedures.  A reduction has potential to interfere with diagnostic procedures.  Nipple or breast piercing can increase risks of infection.  This procedure is best done when the breast is fully developed.  Changes in the breast will continue to occur over time.  Pregnancy can alter the outcomes of previous breast reduction surgery, weight gain and  weigh loss can also effect the long term appearance.  Electronically signed by: Clance Boll, PA-C 12/14/2021 2:49 PM

## 2021-12-14 ENCOUNTER — Encounter: Payer: Self-pay | Admitting: Student

## 2021-12-14 ENCOUNTER — Ambulatory Visit (INDEPENDENT_AMBULATORY_CARE_PROVIDER_SITE_OTHER): Payer: BC Managed Care – PPO | Admitting: Student

## 2021-12-14 VITALS — BP 129/85 | HR 80 | Ht 60.0 in | Wt 125.8 lb

## 2021-12-14 DIAGNOSIS — N62 Hypertrophy of breast: Secondary | ICD-10-CM

## 2021-12-14 MED ORDER — TRAMADOL HCL 50 MG PO TABS: 50.0000 mg | ORAL_TABLET | Freq: Three times a day (TID) | ORAL | 0 refills | Status: DC | PRN

## 2021-12-14 MED ORDER — ONDANSETRON HCL 4 MG PO TABS: 4.0000 mg | ORAL_TABLET | Freq: Three times a day (TID) | ORAL | 0 refills | Status: DC | PRN

## 2021-12-14 MED ORDER — CEPHALEXIN 500 MG PO CAPS: 500.0000 mg | ORAL_CAPSULE | Freq: Four times a day (QID) | ORAL | 0 refills | Status: AC

## 2021-12-28 NOTE — Telephone Encounter (Signed)
Error

## 2021-12-29 ENCOUNTER — Encounter (INDEPENDENT_AMBULATORY_CARE_PROVIDER_SITE_OTHER): Payer: Self-pay

## 2021-12-29 ENCOUNTER — Encounter (HOSPITAL_BASED_OUTPATIENT_CLINIC_OR_DEPARTMENT_OTHER): Payer: Self-pay | Admitting: Plastic Surgery

## 2021-12-29 ENCOUNTER — Other Ambulatory Visit: Payer: Self-pay

## 2022-01-03 ENCOUNTER — Encounter (INDEPENDENT_AMBULATORY_CARE_PROVIDER_SITE_OTHER): Payer: Self-pay | Admitting: Family Medicine

## 2022-01-03 ENCOUNTER — Ambulatory Visit (INDEPENDENT_AMBULATORY_CARE_PROVIDER_SITE_OTHER): Payer: BC Managed Care – PPO | Admitting: Family Medicine

## 2022-01-03 VITALS — BP 124/81 | HR 82 | Temp 98.2°F | Ht 60.0 in | Wt 126.0 lb

## 2022-01-03 DIAGNOSIS — F419 Anxiety disorder, unspecified: Secondary | ICD-10-CM | POA: Diagnosis not present

## 2022-01-03 DIAGNOSIS — E559 Vitamin D deficiency, unspecified: Secondary | ICD-10-CM

## 2022-01-03 DIAGNOSIS — E669 Obesity, unspecified: Secondary | ICD-10-CM

## 2022-01-03 DIAGNOSIS — Z6824 Body mass index (BMI) 24.0-24.9, adult: Secondary | ICD-10-CM

## 2022-01-03 DIAGNOSIS — F32A Depression, unspecified: Secondary | ICD-10-CM | POA: Diagnosis not present

## 2022-01-03 DIAGNOSIS — Z6830 Body mass index (BMI) 30.0-30.9, adult: Secondary | ICD-10-CM

## 2022-01-03 MED ORDER — VITAMIN D (ERGOCALCIFEROL) 1.25 MG (50000 UNIT) PO CAPS: 50000.0000 [IU] | ORAL_CAPSULE | ORAL | 0 refills | Status: DC

## 2022-01-04 NOTE — Anesthesia Preprocedure Evaluation (Signed)
Anesthesia Evaluation  Patient identified by MRN, date of birth, ID band Patient awake    Reviewed: Allergy & Precautions, NPO status , Patient's Chart, lab work & pertinent test results  History of Anesthesia Complications (+) history of anesthetic complications  Airway Mallampati: II  TM Distance: >3 FB Neck ROM: Full    Dental no notable dental hx. (+) Teeth Intact, Dental Advisory Given   Pulmonary neg pulmonary ROS, former smoker,    Pulmonary exam normal breath sounds clear to auscultation       Cardiovascular negative cardio ROS Normal cardiovascular exam Rhythm:Regular Rate:Normal     Neuro/Psych  Headaches, Anxiety negative neurological ROS  negative psych ROS   GI/Hepatic negative GI ROS, Neg liver ROS,   Endo/Other  negative endocrine ROS  Renal/GU negative Renal ROS  negative genitourinary   Musculoskeletal negative musculoskeletal ROS (+)   Abdominal   Peds negative pediatric ROS (+)  Hematology negative hematology ROS (+)   Anesthesia Other Findings All: hydrocodone  Reproductive/Obstetrics negative OB ROS                            Anesthesia Physical Anesthesia Plan  ASA: 3  Anesthesia Plan: General   Post-op Pain Management:    Induction: Intravenous  PONV Risk Score and Plan: 4 or greater and Treatment may vary due to age or medical condition, Ondansetron, Dexamethasone and Midazolam  Airway Management Planned: Oral ETT  Additional Equipment: None  Intra-op Plan:   Post-operative Plan: Extubation in OR  Informed Consent: I have reviewed the patients History and Physical, chart, labs and discussed the procedure including the risks, benefits and alternatives for the proposed anesthesia with the patient or authorized representative who has indicated his/her understanding and acceptance.     Dental advisory given  Plan Discussed with:   Anesthesia Plan  Comments:        Anesthesia Quick Evaluation

## 2022-01-05 ENCOUNTER — Other Ambulatory Visit: Payer: Self-pay

## 2022-01-05 ENCOUNTER — Ambulatory Visit (HOSPITAL_BASED_OUTPATIENT_CLINIC_OR_DEPARTMENT_OTHER): Payer: BC Managed Care – PPO | Admitting: Anesthesiology

## 2022-01-05 ENCOUNTER — Encounter (HOSPITAL_BASED_OUTPATIENT_CLINIC_OR_DEPARTMENT_OTHER): Payer: Self-pay | Admitting: Plastic Surgery

## 2022-01-05 ENCOUNTER — Ambulatory Visit (HOSPITAL_BASED_OUTPATIENT_CLINIC_OR_DEPARTMENT_OTHER)
Admission: RE | Admit: 2022-01-05 | Discharge: 2022-01-05 | Disposition: A | Discharge status: Home / Self Care | Payer: BC Managed Care – PPO | Attending: Plastic Surgery | Admitting: Plastic Surgery

## 2022-01-05 ENCOUNTER — Encounter (HOSPITAL_BASED_OUTPATIENT_CLINIC_OR_DEPARTMENT_OTHER)
Admission: RE | Disposition: A | Discharge status: Home / Self Care | Payer: Self-pay | Source: Home / Self Care | Attending: Plastic Surgery

## 2022-01-05 DIAGNOSIS — M542 Cervicalgia: Secondary | ICD-10-CM

## 2022-01-05 DIAGNOSIS — N6081 Other benign mammary dysplasias of right breast: Secondary | ICD-10-CM | POA: Insufficient documentation

## 2022-01-05 DIAGNOSIS — Z419 Encounter for procedure for purposes other than remedying health state, unspecified: Secondary | ICD-10-CM

## 2022-01-05 DIAGNOSIS — N62 Hypertrophy of breast: Secondary | ICD-10-CM | POA: Insufficient documentation

## 2022-01-05 DIAGNOSIS — M546 Pain in thoracic spine: Secondary | ICD-10-CM

## 2022-01-05 DIAGNOSIS — F419 Anxiety disorder, unspecified: Secondary | ICD-10-CM | POA: Insufficient documentation

## 2022-01-05 DIAGNOSIS — Z87891 Personal history of nicotine dependence: Secondary | ICD-10-CM | POA: Diagnosis not present

## 2022-01-05 DIAGNOSIS — N6082 Other benign mammary dysplasias of left breast: Secondary | ICD-10-CM | POA: Diagnosis not present

## 2022-01-05 HISTORY — DX: Gastro-esophageal reflux disease without esophagitis: K21.9

## 2022-01-05 HISTORY — PX: BREAST REDUCTION SURGERY: SHX8

## 2022-01-05 HISTORY — DX: Other specified postprocedural states: Z98.890

## 2022-01-05 LAB — POCT PREGNANCY, URINE: Preg Test, Ur: NEGATIVE

## 2022-01-05 SURGERY — MAMMOPLASTY, REDUCTION
Anesthesia: General | Site: Breast | Laterality: Bilateral

## 2022-01-05 MED ORDER — SCOPOLAMINE 1 MG/3DAYS TD PT72
1.0000 | MEDICATED_PATCH | TRANSDERMAL | Status: DC
  Administered 2022-01-05: 1.5 mg via TRANSDERMAL

## 2022-01-05 MED ORDER — PROPOFOL 500 MG/50ML IV EMUL
INTRAVENOUS | Status: DC | PRN
  Administered 2022-01-05: 175 ug/kg/min via INTRAVENOUS

## 2022-01-05 MED ORDER — LACTATED RINGERS IV SOLN: INTRAVENOUS | Status: DC

## 2022-01-05 MED ORDER — ONDANSETRON HCL 4 MG/2ML IJ SOLN: 4.0000 mg | Freq: Once | INTRAMUSCULAR | Status: DC | PRN

## 2022-01-05 MED ORDER — KETAMINE HCL 10 MG/ML IJ SOLN
INTRAMUSCULAR | Status: DC | PRN
  Administered 2022-01-05: 10 mg via INTRAVENOUS
  Administered 2022-01-05: 20 mg via INTRAVENOUS

## 2022-01-05 MED ORDER — ROCURONIUM BROMIDE 100 MG/10ML IV SOLN
INTRAVENOUS | Status: DC | PRN
  Administered 2022-01-05: 20 mg via INTRAVENOUS
  Administered 2022-01-05: 70 mg via INTRAVENOUS

## 2022-01-05 MED ORDER — 0.9 % SODIUM CHLORIDE (POUR BTL) OPTIME
TOPICAL | Status: DC | PRN
  Administered 2022-01-05: 1000 mL

## 2022-01-05 MED ORDER — FENTANYL CITRATE (PF) 100 MCG/2ML IJ SOLN
INTRAMUSCULAR | Status: AC
  Filled 2022-01-05: qty 2

## 2022-01-05 MED ORDER — OXYCODONE HCL 5 MG/5ML PO SOLN: 5.0000 mg | Freq: Once | ORAL | Status: DC | PRN

## 2022-01-05 MED ORDER — ONDANSETRON HCL 4 MG/2ML IJ SOLN
INTRAMUSCULAR | Status: DC | PRN
  Administered 2022-01-05: 4 mg via INTRAVENOUS

## 2022-01-05 MED ORDER — OXYCODONE HCL 5 MG PO TABS: 5.0000 mg | ORAL_TABLET | Freq: Once | ORAL | Status: DC | PRN

## 2022-01-05 MED ORDER — PROPOFOL 10 MG/ML IV BOLUS
INTRAVENOUS | Status: DC | PRN
  Administered 2022-01-05: 160 mg via INTRAVENOUS

## 2022-01-05 MED ORDER — HYDROMORPHONE HCL 1 MG/ML IJ SOLN: 0.2500 mg | INTRAMUSCULAR | Status: DC | PRN

## 2022-01-05 MED ORDER — CEFAZOLIN SODIUM-DEXTROSE 2-4 GM/100ML-% IV SOLN
2.0000 g | INTRAVENOUS | Status: AC
  Administered 2022-01-05: 2 g via INTRAVENOUS

## 2022-01-05 MED ORDER — DEXMEDETOMIDINE (PRECEDEX) IN NS 20 MCG/5ML (4 MCG/ML) IV SYRINGE
PREFILLED_SYRINGE | INTRAVENOUS | Status: DC | PRN
  Administered 2022-01-05 (×2): 4 ug via INTRAVENOUS

## 2022-01-05 MED ORDER — ONDANSETRON HCL 4 MG/2ML IJ SOLN
INTRAMUSCULAR | Status: AC
  Filled 2022-01-05: qty 2

## 2022-01-05 MED ORDER — CHLORHEXIDINE GLUCONATE CLOTH 2 % EX PADS
6.0000 | MEDICATED_PAD | Freq: Once | CUTANEOUS | Status: AC
  Administered 2022-01-05: 6 via TOPICAL

## 2022-01-05 MED ORDER — MIDAZOLAM HCL 5 MG/5ML IJ SOLN
INTRAMUSCULAR | Status: DC | PRN
  Administered 2022-01-05: 2 mg via INTRAVENOUS

## 2022-01-05 MED ORDER — LIDOCAINE-EPINEPHRINE 1 %-1:100000 IJ SOLN
INTRAMUSCULAR | Status: DC | PRN
  Administered 2022-01-05: 45 mL

## 2022-01-05 MED ORDER — EPINEPHRINE PF 1 MG/ML IJ SOLN
INTRAMUSCULAR | Status: AC
  Filled 2022-01-05: qty 1

## 2022-01-05 MED ORDER — SUGAMMADEX SODIUM 200 MG/2ML IV SOLN
INTRAVENOUS | Status: DC | PRN
  Administered 2022-01-05: 125 mg via INTRAVENOUS

## 2022-01-05 MED ORDER — CEFAZOLIN SODIUM-DEXTROSE 2-4 GM/100ML-% IV SOLN
INTRAVENOUS | Status: AC
  Filled 2022-01-05: qty 100

## 2022-01-05 MED ORDER — DEXMEDETOMIDINE HCL IN NACL 80 MCG/20ML IV SOLN
INTRAVENOUS | Status: AC
  Filled 2022-01-05: qty 20

## 2022-01-05 MED ORDER — LIDOCAINE HCL (CARDIAC) PF 100 MG/5ML IV SOSY
PREFILLED_SYRINGE | INTRAVENOUS | Status: DC | PRN
  Administered 2022-01-05: 60 mg via INTRAVENOUS

## 2022-01-05 MED ORDER — LIDOCAINE HCL (PF) 1 % IJ SOLN
INTRAMUSCULAR | Status: AC
  Filled 2022-01-05: qty 60

## 2022-01-05 MED ORDER — FENTANYL CITRATE (PF) 100 MCG/2ML IJ SOLN
INTRAMUSCULAR | Status: DC | PRN
  Administered 2022-01-05 (×3): 50 ug via INTRAVENOUS
  Administered 2022-01-05: 25 ug via INTRAVENOUS

## 2022-01-05 MED ORDER — LIDOCAINE HCL 1 % IJ SOLN
INTRAVENOUS | Status: DC | PRN
  Administered 2022-01-05: 300 mL

## 2022-01-05 MED ORDER — SCOPOLAMINE 1 MG/3DAYS TD PT72
MEDICATED_PATCH | TRANSDERMAL | Status: AC
  Filled 2022-01-05: qty 1

## 2022-01-05 MED ORDER — DEXAMETHASONE SODIUM PHOSPHATE 4 MG/ML IJ SOLN
INTRAMUSCULAR | Status: DC | PRN
  Administered 2022-01-05: 10 mg via INTRAVENOUS

## 2022-01-05 MED ORDER — KETAMINE HCL 50 MG/5ML IJ SOSY
PREFILLED_SYRINGE | INTRAMUSCULAR | Status: AC
  Filled 2022-01-05: qty 5

## 2022-01-05 MED ORDER — MIDAZOLAM HCL 2 MG/2ML IJ SOLN
INTRAMUSCULAR | Status: AC
  Filled 2022-01-05: qty 2

## 2022-01-05 MED ORDER — KETOROLAC TROMETHAMINE 30 MG/ML IJ SOLN: 30.0000 mg | Freq: Once | INTRAMUSCULAR | Status: DC | PRN

## 2022-01-05 MED ORDER — PROPOFOL 10 MG/ML IV BOLUS
INTRAVENOUS | Status: AC
  Filled 2022-01-05: qty 20

## 2022-01-05 SURGICAL SUPPLY — 68 items
ADH SKN CLS APL DERMABOND .7 (GAUZE/BANDAGES/DRESSINGS) ×2
BAG DECANTER FOR FLEXI CONT (MISCELLANEOUS) ×2 IMPLANT
BINDER BREAST LRG (GAUZE/BANDAGES/DRESSINGS) ×1 IMPLANT
BINDER BREAST MEDIUM (GAUZE/BANDAGES/DRESSINGS) IMPLANT
BINDER BREAST XLRG (GAUZE/BANDAGES/DRESSINGS) IMPLANT
BINDER BREAST XXLRG (GAUZE/BANDAGES/DRESSINGS) IMPLANT
BIOPATCH RED 1 DISK 7.0 (GAUZE/BANDAGES/DRESSINGS) IMPLANT
BLADE HEX COATED 2.75 (ELECTRODE) ×2 IMPLANT
BLADE KNIFE PERSONA 10 (BLADE) ×8 IMPLANT
BLADE SURG 15 STRL LF DISP TIS (BLADE) IMPLANT
BLADE SURG 15 STRL SS (BLADE) ×2
CANISTER SUCT 1200ML W/VALVE (MISCELLANEOUS) ×3 IMPLANT
COVER BACK TABLE 60X90IN (DRAPES) ×3 IMPLANT
COVER MAYO STAND STRL (DRAPES) ×3 IMPLANT
DERMABOND ADVANCED (GAUZE/BANDAGES/DRESSINGS) ×2
DERMABOND ADVANCED .7 DNX12 (GAUZE/BANDAGES/DRESSINGS) ×4 IMPLANT
DRAIN CHANNEL 19F RND (DRAIN) IMPLANT
DRAPE LAPAROSCOPIC ABDOMINAL (DRAPES) ×3 IMPLANT
DRSG OPSITE POSTOP 4X12 (GAUZE/BANDAGES/DRESSINGS) ×2 IMPLANT
DRSG OPSITE POSTOP 4X6 (GAUZE/BANDAGES/DRESSINGS) IMPLANT
DRSG PAD ABDOMINAL 8X10 ST (GAUZE/BANDAGES/DRESSINGS) ×6 IMPLANT
ELECT BLADE 4.0 EZ CLEAN MEGAD (MISCELLANEOUS) ×2
ELECT REM PT RETURN 9FT ADLT (ELECTROSURGICAL) ×2
ELECTRODE BLDE 4.0 EZ CLN MEGD (MISCELLANEOUS) ×2 IMPLANT
ELECTRODE REM PT RTRN 9FT ADLT (ELECTROSURGICAL) ×2 IMPLANT
EVACUATOR SILICONE 100CC (DRAIN) IMPLANT
GAUZE SPONGE 4X4 12PLY STRL LF (GAUZE/BANDAGES/DRESSINGS) IMPLANT
GLOVE BIO SURGEON STRL SZ 6.5 (GLOVE) ×9 IMPLANT
GLOVE BIOGEL PI IND STRL 7.0 (GLOVE) ×2 IMPLANT
GLOVE BIOGEL PI INDICATOR 7.0 (GLOVE) ×1
GOWN STRL REUS W/ TWL LRG LVL3 (GOWN DISPOSABLE) ×4 IMPLANT
GOWN STRL REUS W/TWL LRG LVL3 (GOWN DISPOSABLE) ×8
NDL FILTER BLUNT 18X1 1/2 (NEEDLE) ×2 IMPLANT
NDL HYPO 25X1 1.5 SAFETY (NEEDLE) ×2 IMPLANT
NDL SAFETY ECLIPSE 18X1.5 (NEEDLE) IMPLANT
NEEDLE FILTER BLUNT 18X 1/2SAF (NEEDLE)
NEEDLE FILTER BLUNT 18X1 1/2 (NEEDLE) IMPLANT
NEEDLE HYPO 18GX1.5 SHARP (NEEDLE) ×2
NEEDLE HYPO 25X1 1.5 SAFETY (NEEDLE) ×2 IMPLANT
NS IRRIG 1000ML POUR BTL (IV SOLUTION) ×1 IMPLANT
PACK BASIN DAY SURGERY FS (CUSTOM PROCEDURE TRAY) ×3 IMPLANT
PAD ALCOHOL SWAB (MISCELLANEOUS) ×1 IMPLANT
PAD FOAM SILICONE BACKED (GAUZE/BANDAGES/DRESSINGS) IMPLANT
PENCIL SMOKE EVACUATOR (MISCELLANEOUS) ×3 IMPLANT
PIN SAFETY STERILE (MISCELLANEOUS) IMPLANT
SLEEVE SCD COMPRESS KNEE MED (STOCKING) ×3 IMPLANT
SPIKE FLUID TRANSFER (MISCELLANEOUS) IMPLANT
SPONGE T-LAP 18X18 ~~LOC~~+RFID (SPONGE) ×7 IMPLANT
STRIP SUTURE WOUND CLOSURE 1/2 (MISCELLANEOUS) ×8 IMPLANT
SUT MNCRL AB 4-0 PS2 18 (SUTURE) ×15 IMPLANT
SUT MON AB 3-0 SH 27 (SUTURE) ×8
SUT MON AB 3-0 SH27 (SUTURE) ×8 IMPLANT
SUT MON AB 5-0 PS2 18 (SUTURE) ×1 IMPLANT
SUT PDS 3-0 CT2 (SUTURE) ×14
SUT PDS II 3-0 CT2 27 ABS (SUTURE) ×12 IMPLANT
SUT SILK 3 0 PS 1 (SUTURE) IMPLANT
SYR 3ML 23GX1 SAFETY (SYRINGE) ×1 IMPLANT
SYR 50ML LL SCALE MARK (SYRINGE) ×1 IMPLANT
SYR BULB IRRIG 60ML STRL (SYRINGE) ×3 IMPLANT
SYR CONTROL 10ML LL (SYRINGE) ×3 IMPLANT
TAPE MEASURE VINYL STERILE (MISCELLANEOUS) IMPLANT
TOWEL GREEN STERILE FF (TOWEL DISPOSABLE) ×6 IMPLANT
TRAY DSU PREP LF (CUSTOM PROCEDURE TRAY) ×3 IMPLANT
TUBE CONNECTING 20X1/4 (TUBING) ×3 IMPLANT
TUBING INFILTRATION IT-10001 (TUBING) ×1 IMPLANT
TUBING SET GRADUATE ASPIR 12FT (MISCELLANEOUS) ×1 IMPLANT
UNDERPAD 30X36 HEAVY ABSORB (UNDERPADS AND DIAPERS) ×6 IMPLANT
YANKAUER SUCT BULB TIP NO VENT (SUCTIONS) ×3 IMPLANT

## 2022-01-05 NOTE — Transfer of Care (Signed)
Immediate Anesthesia Transfer of Care Note  Patient: Aimee Gray  Procedure(s) Performed: MAMMARY REDUCTION  (BREAST) (Bilateral: Breast)  Patient Location: PACU  Anesthesia Type:General  Level of Consciousness: drowsy  Airway & Oxygen Therapy: Patient Spontanous Breathing and Patient connected to face mask oxygen  Post-op Assessment: Report given to RN and Post -op Vital signs reviewed and stable  Post vital signs: Reviewed and stable  Last Vitals:  Vitals Value Taken Time  BP 137/89 01/05/22 1126  Temp    Pulse 81 01/05/22 1127  Resp 18 01/05/22 1127  SpO2 99 % 01/05/22 1127  Vitals shown include unvalidated device data.  Last Pain:  Vitals:   01/05/22 0734  TempSrc: Oral  PainSc: 0-No pain      Patients Stated Pain Goal: 3 (01/05/22 0734)  Complications: No notable events documented.

## 2022-01-05 NOTE — Anesthesia Procedure Notes (Signed)
Procedure Name: Intubation Date/Time: 01/05/2022 8:44 AM  Performed by: Thornell Mule, CRNAPre-anesthesia Checklist: Patient identified, Emergency Drugs available, Suction available and Patient being monitored Patient Re-evaluated:Patient Re-evaluated prior to induction Oxygen Delivery Method: Circle system utilized Preoxygenation: Pre-oxygenation with 100% oxygen Induction Type: IV induction Ventilation: Mask ventilation without difficulty Laryngoscope Size: Miller and 3 Grade View: Grade I Tube type: Oral Tube size: 7.0 mm Number of attempts: 1 Airway Equipment and Method: Stylet and Oral airway Placement Confirmation: ETT inserted through vocal cords under direct vision, positive ETCO2 and breath sounds checked- equal and bilateral Secured at: 20 cm Tube secured with: Tape Dental Injury: Teeth and Oropharynx as per pre-operative assessment

## 2022-01-05 NOTE — Anesthesia Postprocedure Evaluation (Signed)
Anesthesia Post Note  Patient: Aimee Gray  Procedure(s) Performed: MAMMARY REDUCTION  (BREAST) (Bilateral: Breast)     Patient location during evaluation: PACU Anesthesia Type: General Level of consciousness: awake and alert Pain management: pain level controlled Vital Signs Assessment: post-procedure vital signs reviewed and stable Respiratory status: spontaneous breathing, nonlabored ventilation, respiratory function stable and patient connected to nasal cannula oxygen Cardiovascular status: blood pressure returned to baseline and stable Postop Assessment: no apparent nausea or vomiting Anesthetic complications: no   No notable events documented.  Last Vitals:  Vitals:   01/05/22 1230 01/05/22 1300  BP: 137/82 137/81  Pulse: 77 74  Resp: 13 20  Temp:  (!) 36.4 C  SpO2: 96% 98%    Last Pain:  Vitals:   01/05/22 1300  TempSrc: Oral  PainSc: 0-No pain                 Trevor Iha

## 2022-01-05 NOTE — Interval H&P Note (Signed)
History and Physical Interval Note:  01/05/2022 7:51 AM  Aimee Gray  has presented today for surgery, with the diagnosis of Symptomatic mammary hypertrophy.  The various methods of treatment have been discussed with the patient and family. After consideration of risks, benefits and other options for treatment, the patient has consented to  Procedure(s): MAMMARY REDUCTION  (BREAST) (Bilateral) as a surgical intervention.  The patient's history has been reviewed, patient examined, no change in status, stable for surgery.  I have reviewed the patient's chart and labs.  Questions were answered to the patient's satisfaction.     Alena Bills Romona Murdy

## 2022-01-05 NOTE — Discharge Instructions (Addendum)
INSTRUCTIONS FOR AFTER BREAST SURGERY   You will likely have some questions about what to expect following your operation.  The following information will help you and your family understand what to expect when you are discharged from the hospital.  Following these guidelines will help ensure a smooth recovery and reduce risks of complications.  Postoperative instructions include information on: diet, wound care, medications and physical activity.  AFTER SURGERY Expect to go home after the procedure.  In some cases, you may need to spend one night in the hospital for observation.  DIET Breast surgery does not require a specific diet.  However, the healthier you eat the better your body can start healing. It is important to increasing your protein intake.  This means limiting the foods with sugar and carbohydrates.  Focus on vegetables and some meat.  If you have any liposuction during your procedure be sure to drink water.  If your urine is bright yellow, then it is concentrated, and you need to drink more water.  As a general rule after surgery, you should have 8 ounces of water every hour while awake.  If you find you are persistently nauseated or unable to take in liquids let us know.  NO TOBACCO USE or EXPOSURE.  This will slow your healing process and increase the risk of a wound.  WOUND CARE Leave the binder on for 3 days . Use fragrance free soap.   After 3 days you can remove the binder to shower. Once dry apply ACE wrap, binder or sports bra.  Use a mild soap like Dial, Dove and Ivory. You may have Topifoam or Lipofoam on.  It is soft and spongy and helps keep you from getting creases if you have liposuction.  This can be removed before the shower and then replaced.  If you need more it is available on [employer] (Lipofoam). If you have steri-strips / tape directly attached to your skin leave them in place. It is OK to get these wet.   No baths, pools or hot tubs for four weeks. We close your  incision to leave the smallest and best-looking scar. No ointment or creams on your incisions until given the go ahead.  Especially not Neosporin (Too many skin reactions with this one).  A few weeks after surgery you can use Mederma and start massaging the scar. We ask you to wear your binder or sports bra for the first 6 weeks around the clock, including while sleeping. This provides added comfort and helps reduce the fluid accumulation at the surgery site.  ACTIVITY No heavy lifting until cleared by the doctor.  This usually means no more than a half-gallon of milk.  It is OK to walk and climb stairs. In fact, moving your legs is very important to decrease your risk of a blood clot.  It will also help keep you from getting deconditioned.  Every 1 to 2 hours get up and walk for 5 minutes. This will help with a quicker recovery back to normal.  Let pain be your guide so you don't do too much.  This is not the time for spring cleaning and don't plan on taking care of anyone else.  This time is for you to recover,  You will be more comfortable if you sleep and rest with your head elevated either with a few pillows under you or in a recliner.  No stomach sleeping for a three months.  WORK Everyone returns to work at different times.   As a rough guide, most people take at least 1 - 2 weeks off prior to returning to work. If you need documentation for your job, bring the forms to your postoperative follow up visit.  DRIVING Arrange for someone to bring you home from the hospital.  You may be able to drive a few days after surgery but not while taking any narcotics or valium.  BOWEL MOVEMENTS Constipation can occur after anesthesia and while taking pain medication.  It is important to stay ahead for your comfort.  We recommend taking Milk of Magnesia (2 tablespoons; twice a day) while taking the pain pills.  MEDICATIONS You may be prescribed should start after surgery At your preoperative visit for you  history and physical you may have been given the following medications: An antibiotic: Start this medication when you get home and take according to the instructions on the bottle. Zofran 4 mg:  This is to treat nausea and vomiting.  You can take this every 6 hours as needed and only if needed. Valium 2 mg: This is for muscle tightness if you have an implant or expander. This will help relax your muscle which also helps with pain control.  This can be taken every 12 hours as needed. Don't drive after taking this medication. Norco (hydrocodone/acetaminophen) 5/325 mg:  This is only to be used after you have taken the motrin or the tylenol. Every 8 hours as needed.   Over the counter Medication to take: Ibuprofen (Motrin) 600 mg:  Take this every 6 hours.  If you have additional pain then take 500 mg of the tylenol every 8 hours.  Only take the Norco after you have tried these two. Miralax or stool softener of choice: Take this according to the bottle if you take the Norco.  WHEN TO CALL Call your surgeon's office if any of the following occur: Fever 101 degrees F or greater Excessive bleeding or fluid from the incision site. Pain that increases over time without aid from the medications Redness, warmth, or pus draining from incision sites Persistent nausea or inability to take in liquids Severe misshapen area that underwent the operation.   Post Anesthesia Home Care Instructions  Activity: Get plenty of rest for the remainder of the day. A responsible individual must stay with you for 24 hours following the procedure.  For the next 24 hours, DO NOT: -Drive a car -Operate machinery -Drink alcoholic beverages -Take any medication unless instructed by your physician -Make any legal decisions or sign important papers.  Meals: Start with liquid foods such as gelatin or soup. Progress to regular foods as tolerated. Avoid greasy, spicy, heavy foods. If nausea and/or vomiting occur, drink  only clear liquids until the nausea and/or vomiting subsides. Call your physician if vomiting continues.  Special Instructions/Symptoms: Your throat may feel dry or sore from the anesthesia or the breathing tube placed in your throat during surgery. If this causes discomfort, gargle with warm salt water. The discomfort should disappear within 24 hours.  If you had a scopolamine patch placed behind your ear for the management of post- operative nausea and/or vomiting:  1. The medication in the patch is effective for 72 hours, after which it should be removed.  Wrap patch in a tissue and discard in the trash. Wash hands thoroughly with soap and water. 2. You may remove the patch earlier than 72 hours if you experience unpleasant side effects which may include dry mouth, dizziness or visual disturbances. 3. Avoid touching   the patch. Wash your hands with soap and water after contact with the patch.   

## 2022-01-05 NOTE — Op Note (Signed)
Breast Reduction Op note:    DATE OF PROCEDURE: 01/05/2022  LOCATION: [facility]  SURGEON: [facility] Nysia Dell, DO  ASSISTANT: Caroline More, PA  PREOPERATIVE DIAGNOSIS 1. Macromastia 2. Neck Pain 3. Back Pain  POSTOPERATIVE DIAGNOSIS 1. Macromastia 2. Neck Pain 3. Back Pain  PROCEDURES 1. Bilateral breast reduction.  Right reduction 700 g, Left reduction 690 g  COMPLICATIONS: None.  DRAINS: none  INDICATIONS FOR PROCEDURE Aimee Gray is a 51 y.o. year-old female born on 07-Feb-1971,with a history of symptomatic macromastia with concominant back pain, neck pain, shoulder grooving from her bra.   MRN: [id number]  CONSENT Informed consent was obtained directly from the patient. The risks, benefits and alternatives were fully discussed. Specific risks including but not limited to bleeding, infection, hematoma, seroma, scarring, pain, nipple necrosis, asymmetry, poor cosmetic results, and need for further surgery were discussed. The patient had ample opportunity to have her questions answered to her satisfaction.  DESCRIPTION OF PROCEDURE  Patient was brought into the operating room and placed in a supine position.  SCDs were placed and appropriate padding was performed.  Antibiotics were given. The patient underwent general anesthesia and the chest was prepped and draped in a sterile fashion.  A timeout was performed and all information was confirmed to be correct. Tumescent was placed in the lateral breasts and liposuction done for improved contour.  Right side: Preoperative markings were confirmed.  Incision lines were injected with local with epinephrine.  After waiting for vasoconstriction, the marked lines were incised.  A Wise-pattern superomedial breast reduction was performed by de-epithelializing the pedicle, using bovie to create the superomedial pedicle, and removing breast tissue from the superior, lateral, and inferior portions of the  breast.  Care was taken to not undermine the breast pedicle. Hemostasis was achieved.  The nipple was gently rotated into position and the soft tissue closed with 4-0 Monocryl.   The pocket was irrigated and hemostasis confirmed.  The deep tissues were approximated with 3-0 PDS and 3-0 Monocryl sutures and the skin was closed with deep dermal and subcuticular 4-0 Monocryl sutures.  The nipple and skin flaps had good capillary refill at the end of the procedure.    Left side: Preoperative markings were confirmed.  Incision lines were injected with local with epinephrine.  After waiting for vasoconstriction, the marked lines were incised.  A Wise-pattern superomedial breast reduction was performed by de-epithelializing the pedicle, using bovie to create the superomedial pedicle, and removing breast tissue from the superior, lateral, and inferior portions of the breast.  Care was taken to not undermine the breast pedicle. Hemostasis was achieved.  The nipple was gently rotated into position and the soft tissue was closed with 4-0 Monocryl.  The patient was sat upright and size and shape symmetry was confirmed.  The pocket was irrigated and hemostasis confirmed.  The deep tissues were approximated with 3-0 PDS and 3-0 Monocryl sutures and the skin was closed with deep dermal and subcuticular 4-0 Monocryl sutures.  Dermabond was applied.  A breast binder and ABDs were placed.  The nipple and skin flaps had good capillary refill at the end of the procedure.  The patient tolerated the procedure well. The patient was allowed to wake from anesthesia and taken to the recovery room in satisfactory condition.  The advanced practice practitioner (APP) assisted throughout the case.  The APP was essential in retraction and counter traction when needed to make the case progress smoothly.  This retraction and assistance  made it possible to see the tissue plans for the procedure.  The assistance was needed for blood control,  tissue re-approximation and assisted with closure of the incision site.

## 2022-01-06 ENCOUNTER — Encounter (HOSPITAL_BASED_OUTPATIENT_CLINIC_OR_DEPARTMENT_OTHER): Payer: Self-pay | Admitting: Plastic Surgery

## 2022-01-06 LAB — SURGICAL PATHOLOGY

## 2022-01-11 NOTE — Progress Notes (Signed)
Chief Complaint:   OBESITY Aimee Gray is here to discuss her progress with her obesity treatment plan along with follow-up of her obesity related diagnoses. Aimee Gray is on the Category 2 Plan and states she is following her eating plan approximately 70% of the time. Aimee Gray states she is walking/strengthening 15 minutes 3 times per week.  Today's visit was #: 33 Starting weight: 156 lbs Starting date: 10/28/2019 Today's weight: 126 lbs Today's date: 01/03/2022 Total lbs lost to date: 30 lbs Total lbs lost since last in-office visit: 0  Interim History: Aimee Gray recently vacationed with her family. She went off ADHD medication to prep for her upcoming surgery. She did recognize that she ate more indulgence than she anticipated. She has breast reduction scheduled in 2 days. Realizes without her ADHD medication she has struggled with anxiety and focus.  Subjective:   1. Vitamin D deficiency Aimee Gray is currently taking prescription Vit D 50,000 IU once a week. Her last Vit D level of 32.5. Denies any nausea, vomiting or muscle weakness. She notes fatigue.  2. Anxiety and depression Aimee Gray is currently on Trintellix and Wellbutrin. Denies suicidal ideas, and homicidal ideas. Symptoms increased off Ritalin and while on beach trip.  Assessment/Plan:   1. Vitamin D deficiency We will refill Vit D 50,000 IU once a week for 1 month with 0 refills  -Refill Vitamin D, Ergocalciferol, (DRISDOL) 1.25 MG (50000 UNIT) CAPS capsule; Take 1 capsule (50,000 Units total) by mouth every 7 (seven) days.  Dispense: 4 capsule; Refill: 0  2. Anxiety and depression Aimee Gray will continue current medications with no changes in dose or medications.  3. Obesity with current BMI of 24.6 Aimee Gray is currently in the action stage of change. As such, her goal is to continue with weight loss efforts. She has agreed to the Category 2 Plan.   Exercise goals: All adults should avoid inactivity. Some physical activity  is better than none, and adults who participate in any amount of physical activity gain some health benefits. Per surgeon for post op recovery.  Behavioral modification strategies: increasing lean protein intake, meal planning and cooking strategies, and keeping healthy foods in the home.  Aimee Gray has agreed to follow-up with our clinic in 4 weeks. She was informed of the importance of frequent follow-up visits to maximize her success with intensive lifestyle modifications for her multiple health conditions.   Objective:   Blood pressure 124/81, pulse 82, temperature 98.2 F (36.8 C), height 5' (1.524 m), weight 126 lb (57.2 kg), SpO2 98 %. Body mass index is 24.61 kg/m.  General: Cooperative, alert, well developed, in no acute distress. HEENT: Conjunctivae and lids unremarkable. Cardiovascular: Regular rhythm.  Lungs: Normal work of breathing. Neurologic: No focal deficits.   Lab Results  Component Value Date   CREATININE 0.87 03/08/2021   BUN 8 03/08/2021   NA 139 03/08/2021   K 4.2 03/08/2021   CL 102 03/08/2021   CO2 23 03/08/2021   Lab Results  Component Value Date   ALT 15 03/08/2021   AST 14 03/08/2021   ALKPHOS 48 03/08/2021   BILITOT 0.4 03/08/2021   Lab Results  Component Value Date   HGBA1C 5.0 10/06/2021   HGBA1C 5.2 10/28/2019   Lab Results  Component Value Date   INSULIN 4.3 10/06/2021   INSULIN 13.9 10/28/2019   Lab Results  Component Value Date   TSH 2.850 10/28/2019   Lab Results  Component Value Date   CHOL 175 10/06/2021   HDL  44 10/06/2021   LDLCALC 115 (H) 10/06/2021   TRIG 83 10/06/2021   CHOLHDL 4.4 03/08/2021   Lab Results  Component Value Date   VD25OH 32.5 10/06/2021   VD25OH 32.5 10/28/2019   Lab Results  Component Value Date   WBC 10.4 03/08/2021   HGB 14.1 03/08/2021   HCT 40.5 03/08/2021   MCV 91 03/08/2021   PLT 373 03/08/2021   No results found for: "IRON", "TIBC", "FERRITIN"  Attestation Statements:   Reviewed  by clinician on day of visit: allergies, medications, problem list, medical history, surgical history, family history, social history, and previous encounter notes.  I, Fortino Sic, RMA am acting as transcriptionist for Reuben Likes, MD.  I have reviewed the above documentation for accuracy and completeness, and I agree with the above. - Reuben Likes, MD

## 2022-01-14 ENCOUNTER — Encounter: Payer: Self-pay | Admitting: Plastic Surgery

## 2022-01-17 NOTE — Telephone Encounter (Signed)
I called the patient back regarding her MyChart message.  Patient states that she has been experiencing nausea and vomiting in the mornings recently.  She states that she has been taking the Zofran and this helps with her nausea and vomiting.  States that she is no longer taking tramadol.  Patient states that she is finished her antibiotic.  She states that she is not having much pain from her surgery and that she has not been taking Tylenol or ibuprofen either.  Patient denies any fevers or chills.  Patient reports she is able to eat and drink.  Patient states that she has been having regular bowel movements and has been voiding.    I discussed with the patient to plan on keeping her scheduled appointment tomorrow so we can further evaluate her symptoms and examine her surgical site.  I encouraged the patient to continue to drink plenty of water.  I discussed with the patient to call us back if she has any concerns or if anything changes before tomorrow.  Patient expressed understanding.

## 2022-01-18 ENCOUNTER — Encounter: Payer: Self-pay | Admitting: Student

## 2022-01-18 ENCOUNTER — Ambulatory Visit (INDEPENDENT_AMBULATORY_CARE_PROVIDER_SITE_OTHER): Payer: BC Managed Care – PPO | Admitting: Student

## 2022-01-18 VITALS — BP 150/89 | HR 89

## 2022-01-18 DIAGNOSIS — N62 Hypertrophy of breast: Secondary | ICD-10-CM

## 2022-01-18 NOTE — Progress Notes (Signed)
Patient is a 51 year old female with history of macromastia.  Patient underwent bilateral breast reduction with Dr. Ulice Bold on 01/05/2022.  700 g removed from the right breast and 690 g removed from the left wrist.  Patient presents to the clinic today for postoperative follow-up.  Today, patient reports she is doing well.  She states that she is still experiencing nausea and vomiting in the morning.  She states that she finds relief by taking a Zofran.  Patient reports she is having regular bowel movements and is passing gas.  She denies any current abdominal pain.  She denies any fevers or chills.  Patient states that she is voiding without issue.  Patient states that she tolerates food and water without issue.  Patient states that she has not taken medications for about a week or so at this point.  Patient denies any issues with the surgical sites.  Chaperone present on exam.  On exam, patient is sitting upright in no acute distress.  Breasts are fairly symmetric and soft bilaterally.  There is overlying ecchymosis noted to her bilateral breast, and extending down to her lateral lower chest bilaterally.  There are no significant fluid collections palpated exam.  There is some minor swelling noted.  NAC's appear viable bilaterally.  Honeycomb dressings were removed.  Incisions appear to be intact with Steri-Strips.  There is no overlying erythema to the breasts bilaterally.  There is no drainage coming from the incisions.  Patient's blood pressure was slightly elevated today.  Patient attributes this to a migraine that she is having today.  I discussed with the patient that the bruising may be due to the liposuction that was done during her surgery.  I offered the patient Arnica during this visit to help with the bruising, but she declined it at this time.  I encouraged the patient to drink plenty of water given that she is experiencing nausea and vomiting.  I discussed with the patient that she may  want to follow-up with her primary care provider regarding her nausea and vomiting.  Patient expressed understanding.  I discussed with the patient to continue compression and activity restrictions.  I instructed the patient to call if she feels that her symptoms get worse or if she has any other questions or concerns.  Patient to follow-up next week.  Objective findings and plan were discussed with Dr. Ulice Bold.

## 2022-01-25 ENCOUNTER — Telehealth: Payer: Self-pay

## 2022-01-25 ENCOUNTER — Telehealth: Payer: Self-pay | Admitting: *Deleted

## 2022-01-25 NOTE — Telephone Encounter (Signed)
A PA for [name] 140MG /ML auto-injectors has been started on CMM. Key: - PA Case [id number]. It is awaiting authorization.

## 2022-01-25 NOTE — Telephone Encounter (Signed)
Spoke to Aimee Gray to see if she could switch appointment from tomorrow to this afternoon (per request from [place], [place]). States unable to switch but questioned if she needed to be seen tomorrow since this was to check on post-op bruising. Advised her I would give message to Alan Ripper and have her call her to discuss. She stated she is available after 2:30 today to talk. Alan Ripper, PA-C made aware and states will contact pt.

## 2022-01-26 ENCOUNTER — Ambulatory Visit (INDEPENDENT_AMBULATORY_CARE_PROVIDER_SITE_OTHER): Payer: BC Managed Care – PPO | Admitting: Student

## 2022-01-26 DIAGNOSIS — N62 Hypertrophy of breast: Secondary | ICD-10-CM

## 2022-01-26 NOTE — Progress Notes (Signed)
Patient is a 52 year old female who underwent bilateral breast reduction with Dr. Ulice Bold on 01/05/2022.  Patient presents to the clinic today for postoperative follow-up.  Patient was last seen in the clinic on 01/18/2022.  At this visit, she reported she was doing well.  She states she was experiencing some nausea and vomiting in the mornings.  On exam, there was overlying ecchymosis to her breast bilaterally.  NAC's were viable bilaterally.  Patient was to follow-up in 1 week.  Today, patient reports she is feeling better.  She states that her nausea and vomiting has resolved.  Patient reports that she feels her bruising has been improving.  She states she has been using over-the-counter Arnica for the bruising.  She denies any other issues with her surgical site.  She denies any fevers or chills.  Chaperone present on exam.  On exam, patient is sitting upright in no acute distress.  Breasts are soft and fairly symmetric.  There is some ecchymosis to the lateral breast bilaterally, but this appears improved from last week's exam.  NAC's appear viable bilaterally.  There is some mild firmness medially to each breast that appears consistent with fat necrosis.  Steri-Strips were removed from incisions.  Incisions to right breast are intact.  There is a small approximately 2 cm x 1 cm superficial wound to the inferior T-zone of the left breast.  There is no surrounding erythema, swelling or drainage.  Incisions are otherwise intact to the left breast.  Several suture knots were noted and these were cut and removed.  Patient tolerated well.  I discussed with the patient that she should apply Vaseline daily over her incisions, and to the wound to her left breast.  I discussed with the patient that she should continue compression at all times.  I discussed with patient to avoid strenuous activities.  I recommended to the patient that she should gently massage the areas of firmness to the medial aspect of her  breast.  Patient expressed understanding.  Instructed the patient to call if she has any questions or concerns.  I told her to call if the wound to her left breast becomes swollen, there is drainage from the wound, there is redness around the wound or if the wound worsens.  Patient expressed understanding.  Pictures were obtained of the patient and placed in the chart with the patient's or guardian's permission.  Patient to follow-up in 1 and half to 2 weeks.  All of the patient's questions were answered to her satisfaction.

## 2022-01-31 ENCOUNTER — Encounter: Payer: BC Managed Care – PPO | Admitting: Surgical

## 2022-01-31 NOTE — Telephone Encounter (Signed)
This request has received an approval. 01/25/2022 - 01/26/2023

## 2022-02-08 ENCOUNTER — Ambulatory Visit (INDEPENDENT_AMBULATORY_CARE_PROVIDER_SITE_OTHER): Payer: BC Managed Care – PPO | Admitting: Student

## 2022-02-08 DIAGNOSIS — N62 Hypertrophy of breast: Secondary | ICD-10-CM

## 2022-02-08 NOTE — Progress Notes (Signed)
Patient is a 51 year old female who underwent bilateral breast reduction with Dr. Marla Roe on 01/05/2022.  Patient presents to the clinic today for postoperative follow-up.  Patient was last seen in the clinic on 01/26/2022.  At this visit, she is doing well.  Patient on exam had a small 2 cm x 1 cm superficial wound to the inferior T-zone of the left breast.  Plan was for patient to apply Vaseline daily over her incisions into the wound, and to continue to gently massage to the medial aspect of her breast.  Patient was to follow-up in 2 weeks.  Today, patient reports she is doing well.  She has no new issues or concerns.  She states that she has not really looked at her wound to the left breast, but has been placing Vaseline daily over it.  She denies any fevers or chills.  She denies any nausea or vomiting.  She denies any issues at the surgical site.  Patient states that her bruising is much improved.  Chaperone present on exam.  On exam, patient is sitting upright in no acute distress.  Breasts are soft and fairly symmetric.  There is no overlying erythema.  There are some areas with residual mild ecchymosis.  There is still some areas of firmness to the medial aspect of the left breast and to the lateral aspect of the right breast, that both appear to be consistent with fat necrosis.  NAC's appear to be viable bilaterally.  Incisions to right breast appear to be intact and healing well.  The small superficial wound to the inferior T-zone of the left breast appears to be improved, but is still present.  There is no surrounding erythema, drainage or swelling.  Incision is otherwise intact.  I discussed with the patient that she should continue to place the Vaseline over her incisions into the wound to her left breast.  I discussed with the patient to continue compression.  I discussed with the patient to continue gently massaging the areas of firmness.  Patient expressed understanding.  I instructed the  patient to call if she has any questions or concerns.  Patient to follow-up in 2 weeks.

## 2022-02-14 ENCOUNTER — Ambulatory Visit (INDEPENDENT_AMBULATORY_CARE_PROVIDER_SITE_OTHER): Payer: BC Managed Care – PPO | Admitting: Family Medicine

## 2022-02-14 ENCOUNTER — Encounter (INDEPENDENT_AMBULATORY_CARE_PROVIDER_SITE_OTHER): Payer: Self-pay | Admitting: Family Medicine

## 2022-02-14 VITALS — BP 134/81 | HR 88 | Temp 98.1°F | Ht 60.0 in | Wt 129.0 lb

## 2022-02-14 DIAGNOSIS — E669 Obesity, unspecified: Secondary | ICD-10-CM | POA: Diagnosis not present

## 2022-02-14 DIAGNOSIS — F419 Anxiety disorder, unspecified: Secondary | ICD-10-CM | POA: Diagnosis not present

## 2022-02-14 DIAGNOSIS — E559 Vitamin D deficiency, unspecified: Secondary | ICD-10-CM | POA: Diagnosis not present

## 2022-02-14 DIAGNOSIS — Z6825 Body mass index (BMI) 25.0-25.9, adult: Secondary | ICD-10-CM

## 2022-02-14 DIAGNOSIS — F32A Depression, unspecified: Secondary | ICD-10-CM | POA: Diagnosis not present

## 2022-02-14 MED ORDER — VITAMIN D (ERGOCALCIFEROL) 1.25 MG (50000 UNIT) PO CAPS: 50000.0000 [IU] | ORAL_CAPSULE | ORAL | 0 refills | Status: DC

## 2022-02-14 MED ORDER — BUPROPION HCL ER (SR) 150 MG PO TB12: 150.0000 mg | ORAL_TABLET | Freq: Every day | ORAL | 0 refills | Status: DC

## 2022-02-16 NOTE — Progress Notes (Unsigned)
Chief Complaint:   OBESITY Aimee Gray is here to discuss her progress with her obesity treatment plan along with follow-up of her obesity related diagnoses. Aimee Gray is on the Category 2 Plan and states she is following her eating plan approximately 70% of the time. Aimee Gray states she is doing a little walking.  Today's visit was #: 42 Starting weight: 156 lbs Starting date: 10/28/2019 Today's weight: 129 lbs Today's date: 02/14/2022 Total lbs lost to date: 27 lbs Total lbs lost since last in-office visit: 0  Interim History: Aimee Gray is back form breast reduction surgery. Had some nausea after surgery that resulted in vomiting for a few days. She still has a few open areas that haven't completely healed. 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 Has gotten more on track this week.   Subjective:   1. Vitamin D deficiency Aimee Gray is currently taking prescription Vit D 50,000 IU once a week. Her last Vit D level of 32.5.  2. Anxiety and depression Aimee Gray is on Wellbutrin and Trintellix. Denies suicidal ideas, and homicidal ideas.  Assessment/Plan:   1. Vitamin D deficiency We will refill Vit D 50k IU once a week for 1 month with 0 refills.  -Refill Vitamin D, Ergocalciferol, (DRISDOL) 1.25 MG (50000 UNIT) CAPS capsule; Take 1 capsule (50,000 Units total) by mouth every 7 (seven) days.  Dispense: 4 capsule; Refill: 0  2. Anxiety and depression We will refill Wellbutrin SR 150 mg by mouth daily for 1 month with 0 refills.  -Refill buPROPion (WELLBUTRIN SR) 150 MG 12 hr tablet; Take 1 tablet (150 mg total) by mouth daily.  Dispense: 90 tablet; Refill: 0  3. Obesity with current BMI of 25.3 Aimee Gray is currently in the action stage of change. As such, her goal is to continue with weight loss efforts. She has agreed to the Category 2 Plan.   Exercise goals: All adults should avoid inactivity. Some physical activity is better than none, and adults who  participate in any amount of physical activity gain some health benefits.  Behavioral modification strategies: increasing lean protein intake, meal planning and cooking strategies, keeping healthy foods in the home, and planning for success.  [name] has agreed to follow-up with our clinic in 5 weeks. She was informed of the importance of frequent follow-up visits to maximize her success with intensive lifestyle modifications for her multiple health conditions.   Objective:   Blood pressure 134/81, pulse 88, temperature 98.1 F (36.7 C), height 5' (1.524 m), weight 129 lb (58.5 kg), SpO2 99 %. Body mass index is 25.19 kg/m.  General: Cooperative, alert, well developed, in no acute distress. HEENT: Conjunctivae and lids unremarkable. Cardiovascular: Regular rhythm.  Lungs: Normal work of breathing. Neurologic: No focal deficits.   Lab Results  Component Value Date   CREATININE 0.87 03/08/2021   BUN 8 03/08/2021   NA 139 03/08/2021   K 4.2 03/08/2021   CL 102 03/08/2021   CO2 23 03/08/2021   Lab Results  Component Value Date   ALT 15 03/08/2021   AST 14 03/08/2021   ALKPHOS 48 03/08/2021   BILITOT 0.4 03/08/2021   Lab Results  Component Value Date   HGBA1C 5.0 10/06/2021   HGBA1C 5.2 10/28/2019   Lab Results  Component Value Date   INSULIN 4.3 10/06/2021   INSULIN 13.9 10/28/2019   Lab Results  Component Value Date   TSH 2.850 10/28/2019   Lab Results  Component Value Date   CHOL  175 10/06/2021   HDL 44 10/06/2021   LDLCALC 115 (H) 10/06/2021   TRIG 83 10/06/2021   CHOLHDL 4.4 03/08/2021   Lab Results  Component Value Date   VD25OH 32.5 10/06/2021   VD25OH 32.5 10/28/2019   Lab Results  Component Value Date   WBC 10.4 03/08/2021   HGB 14.1 03/08/2021   HCT 40.5 03/08/2021   MCV 91 03/08/2021   PLT 373 03/08/2021   No results found for: "IRON", "TIBC", "FERRITIN"  Attestation Statements:   Reviewed by clinician on day of visit: allergies,  medications, problem list, medical history, surgical history, family history, social history, and previous encounter notes.  I, Fortino Sic, RMA am acting as transcriptionist for Reuben Likes, MD.  I have reviewed the above documentation for accuracy and completeness, and I agree with the above. - Reuben Likes, MD

## 2022-02-21 ENCOUNTER — Ambulatory Visit (INDEPENDENT_AMBULATORY_CARE_PROVIDER_SITE_OTHER): Payer: BC Managed Care – PPO | Admitting: Student

## 2022-02-21 DIAGNOSIS — N62 Hypertrophy of breast: Secondary | ICD-10-CM

## 2022-02-21 NOTE — Progress Notes (Signed)
Patient is a 51 year old female who underwent bilateral breast reduction with Dr. Marla Roe on 01/05/2022.  Patient presents to the clinic today for postoperative follow-up.  Patient was last seen in the clinic on 02/08/2022.  At this visit, she reported she was doing well.  She had no new issues or concerns.  On exam, there were some areas to the bilateral breast that appeared consistent with fat necrosis.  Her NAC's appeared viable bilaterally.  There is a small superficial wound to the inferior T-zone of the left breast which appears to be improving.  Plan was for patient to continue to place Vaseline over her wound to the left breast and to continue to gently massage the areas of firmness.  Patient was to follow-up in 2 weeks.  Today, patient reports she is doing well.  She states that she sometimes feels her breasts are a little swollen in the mornings, but states the swelling improves throughout the day.  She denies any fevers or chills.  She denies any nausea or vomiting.  She denies any issues with the surgical sites.  Chaperone present on exam.  On exam, patient is sitting upright in no acute distress.  Her breasts are soft and symmetric bilaterally.  There is no significant swelling noted at the time of exam.  There is still little area of firmness to the right medial breast that is consistent with fat necrosis.  NAC's are viable bilaterally.  Incisions are intact and healing well bilaterally.  Wound to the left inferior T-zone appears to be healed.  There is some erythema medially to the breast bilaterally.  There are no subcutaneous fluid collections palpated.  There is no tenderness upon palpation.  It appears to be more irritation to the skin rather than infectious.  I discussed with the patient that her redness may be from irritation from her bra.  I discussed with the patient that my suspicion for infection is low.  I discussed with the patient that she needs to monitor the area closely for  the next 48 hours.  I discussed with the patient that if it does not improve in 48 hours, or if she develops fevers or chills or if it worsens before then, she needs to come back and be reevaluated.  Patient expressed understanding.  I discussed with the patient that she should continue compression at night given she is still experiencing some swelling at night.  I discussed with the patient that since she is 6 weeks postop she may transition into a regular bra without underwire.  I also discussed with the patient that she may start using scar creams such as Mederma, Silagen or Skinuva in about a week or so.  I discussed with the patient that she should not use scar creams until the redness has resolved.  I also discussed with the patient that she should continue to massage that area of firmness to her right breast.  Patient expressed understanding.  I instructed the patient to call if she has any questions or concerns.  Dr. Lovena Le also had the opportunity to examine the patient.  He was in agreement with the plan.  Patient to follow-up in 2 weeks.

## 2022-03-01 ENCOUNTER — Other Ambulatory Visit: Payer: Self-pay | Admitting: Neurology

## 2022-03-01 DIAGNOSIS — G43101 Migraine with aura, not intractable, with status migrainosus: Secondary | ICD-10-CM

## 2022-03-01 NOTE — Telephone Encounter (Signed)
Rx sent 

## 2022-03-04 ENCOUNTER — Ambulatory Visit (HOSPITAL_COMMUNITY)
Admission: RE | Admit: 2022-03-04 | Discharge: 2022-03-04 | Disposition: A | Discharge status: Home / Self Care | Payer: BC Managed Care – PPO | Source: Ambulatory Visit | Attending: Physician Assistant | Admitting: Physician Assistant

## 2022-03-04 ENCOUNTER — Encounter (HOSPITAL_COMMUNITY): Payer: Self-pay

## 2022-03-04 VITALS — BP 134/88 | HR 85 | Temp 97.9°F | Resp 16

## 2022-03-04 DIAGNOSIS — N3090 Cystitis, unspecified without hematuria: Secondary | ICD-10-CM

## 2022-03-04 DIAGNOSIS — R300 Dysuria: Secondary | ICD-10-CM

## 2022-03-04 LAB — POCT URINALYSIS DIPSTICK, ED / UC
Bilirubin Urine: NEGATIVE
Glucose, UA: NEGATIVE mg/dL
Ketones, ur: NEGATIVE mg/dL
Nitrite: NEGATIVE
Protein, ur: NEGATIVE mg/dL
Specific Gravity, Urine: 1.02 (ref 1.005–1.030)
Urobilinogen, UA: 0.2 mg/dL (ref 0.0–1.0)
pH: 6 (ref 5.0–8.0)

## 2022-03-04 MED ORDER — PHENAZOPYRIDINE HCL 200 MG PO TABS: 200.0000 mg | ORAL_TABLET | Freq: Three times a day (TID) | ORAL | 0 refills | Status: DC

## 2022-03-04 MED ORDER — NITROFURANTOIN MONOHYD MACRO 100 MG PO CAPS: 100.0000 mg | ORAL_CAPSULE | Freq: Two times a day (BID) | ORAL | 0 refills | Status: DC

## 2022-03-04 NOTE — ED Triage Notes (Signed)
Patient having bladder spasms, urinary frequency, urgency, burning, and decreased output. Onset 1 week.

## 2022-03-04 NOTE — Discharge Instructions (Addendum)
Advised take the Macrobid every 12 hours on a regular basis to treat the cystitis. Advised take the Pyridium every 6 hours to help relieve bladder spasm and dysuria, this may turn your urine a different color. Advised to increase fluid intake in order to flush the urinary system. Advised to follow-up with PCP or return to urgent care if symptoms fail to improve.

## 2022-03-04 NOTE — ED Provider Notes (Signed)
[facility]    CSN: [id number] Arrival date & time: 03/04/22  1452      History   Chief Complaint Chief Complaint  Patient presents with   Urinary Frequency    Discomfort. Burning. - Entered by patient   Urinary Tract Infection    HPI Aimee Gray is a 51 y.o. female.   51 year old female presents with frequency and dysuria.  She indicates for the past week she has been having increasing frequency, urgency, and intermittent dysuria.  She relates that she continues to have mild bladder pressure and spasms along with the symptoms.  She indicates she tried to take some AZO but it made her nauseated.  She indicates that she has not have any back pain, fever or chills.  She does relate that she has had frequent urinary tract infections in the past but has been a while since she has had 1 occur.  She has been drinking fluids to try to flush the bacteria out.  She is tolerating fluids well. (Patient has been increasing her fluid intake in order to flush the urinary system and this may have a bearing on the urinalysis results.  The urine results and culture need to interpreted with caution.)   Urinary Frequency  Urinary Tract Infection   Past Medical History:  Diagnosis Date   Allergy    Anxiety    Back pain    Depression    GERD (gastroesophageal reflux disease)    Infertility, female    Lactose intolerance    Migraines    Palpitations    PCOS (polycystic ovarian syndrome)    PONV (postoperative nausea and vomiting)     Patient Active Problem List   Diagnosis Date Noted   Back pain 07/16/2021   Neck pain 07/16/2021   Symptomatic mammary hypertrophy 07/16/2021   Vitamin D deficiency 03/31/2020   Insulin resistance 03/31/2020   Class 1 obesity with serious comorbidity and body mass index (BMI) of 30.0 to 30.9 in adult 01/22/2020   Insomnia 01/07/2019   Acute bronchitis 08/10/2016   Migraine with aura and with status migrainosus, not intractable 08/10/2016    Depression 10/14/2014   Generalized anxiety disorder 10/14/2014    Past Surgical History:  Procedure Laterality Date   BREAST REDUCTION SURGERY Bilateral 01/05/2022   Procedure: MAMMARY REDUCTION  (BREAST);  Surgeon: Peggye Form, DO;  Location: [facility];  Service: Plastics;  Laterality: Bilateral;   CESAREAN SECTION      OB History     Gravida  3   Para      Term      Preterm      AB      Living         SAB      IAB      Ectopic      Multiple      Live Births               Home Medications    Prior to Admission medications   Medication Sig Start Date End Date Taking? Authorizing Provider  buPROPion (WELLBUTRIN SR) 150 MG 12 hr tablet Take 1 tablet (150 mg total) by mouth daily. 02/14/22  Yes Langston Reusing, MD  Erenumab-aooe ([name]) 140 MG/ML SOAJ Inject 140 mg into the skin every 30 (thirty) days. 06/18/21  Yes Windell Norfolk, MD  levonorgestrel (MIRENA) 20 MCG/24HR IUD 1 each by Intrauterine route once.   Yes [provider]  methylphenidate (RITALIN LA)  40 MG 24 hr capsule Take 40 mg by mouth daily. 11/01/21  Yes [provider]  nitrofurantoin, macrocrystal-monohydrate, (MACROBID) 100 MG capsule Take 1 capsule (100 mg total) by mouth 2 (two) times daily. 03/04/22  Yes Ellsworth Lennox, PA-C  ondansetron (ZOFRAN ODT) 4 MG disintegrating tablet Take 1 tablet (4 mg total) by mouth every 8 (eight) hours as needed for nausea or vomiting. 03/01/21  Yes Windell Norfolk, MD  phenazopyridine (PYRIDIUM) 200 MG tablet Take 1 tablet (200 mg total) by mouth 3 (three) times daily. 03/04/22  Yes Ellsworth Lennox, PA-C  rizatriptan ([name]-MLT) 10 MG disintegrating tablet DISSOLVE 1 TAB BY MOUTH AS NEEDED FOR MIGRAINE. MAY REPEAT IN 2 HOURS IF NEEDED. 03/01/22  Yes Camara, Amadou, MD  Vitamin D, Ergocalciferol, (DRISDOL) 1.25 MG (50000 UNIT) CAPS capsule Take 1 capsule (50,000 Units total) by mouth every 7 (seven) days. 02/14/22  Yes  Langston Reusing, MD  vortioxetine HBr (TRINTELLIX) 20 MG TABS tablet Take 1 tablet (20 mg total) by mouth daily. 09/06/21  Yes Langston Reusing, MD  ondansetron (ZOFRAN) 4 MG tablet Take 1 tablet (4 mg total) by mouth every 8 (eight) hours as needed for up to 20 doses for nausea or vomiting. 12/14/21   Laurena Spies, PA-C    Family History Family History  Problem Relation Age of Onset   Heart disease Mother    Hypertension Mother    Thyroid disease Mother    Depression Mother    Anxiety disorder Mother    Sleep apnea Mother    Obesity Mother    Diabetes Father    Heart disease Father    Hyperlipidemia Father    Hypertension Father    Cancer Maternal Grandmother    Diabetes Maternal Grandfather    Stroke Maternal Grandfather    Heart disease Paternal Grandmother    Hypertension Paternal Grandmother    Heart disease Paternal Grandfather    Breast cancer Neg Hx     Social History Social History   Tobacco Use   Smoking status: Former   Smokeless tobacco: Former    Quit date: 2000   Tobacco comments:    quit 15 years ago  Substance Use Topics   Alcohol use: No   Drug use: No     Allergies   Hydrocodone   Review of Systems Review of Systems  Genitourinary:  Positive for dysuria and frequency.     Physical Exam Triage Vital Signs ED Triage Vitals  Enc Vitals Group     BP 03/04/22 1517 134/88     Pulse Rate 03/04/22 1517 85     Resp 03/04/22 1517 16     Temp 03/04/22 1517 97.9 F (36.6 C)     Temp Source 03/04/22 1517 Oral     SpO2 03/04/22 1517 97 %     Weight --      Height --      Head Circumference --      Peak Flow --      Pain Score 03/04/22 1519 0     Pain Loc --      Pain Edu? --      Excl. in GC? --    No data found.  Updated Vital Signs BP 134/88 (BP Location: Left Arm)   Pulse 85   Temp 97.9 F (36.6 C) (Oral)   Resp 16   LMP  (LMP Unknown)   SpO2 97%   Visual Acuity Right Eye Distance:   Left Eye Distance:  Bilateral Distance:    Right Eye Near:   Left Eye Near:    Bilateral Near:     Physical Exam Constitutional:      Appearance: Normal appearance.  Abdominal:     General: Abdomen is flat. Bowel sounds are normal.     Palpations: Abdomen is soft.     Tenderness: There is no abdominal tenderness. There is no guarding or rebound.  Neurological:     Mental Status: She is alert.      UC Treatments / Results  Labs (all labs ordered are listed, but only abnormal results are displayed) Labs Reviewed  POCT URINALYSIS DIPSTICK, ED / UC - Abnormal; Notable for the following components:      Result Value   Hgb urine dipstick SMALL (*)    Leukocytes,Ua TRACE (*)    All other components within normal limits  URINE CULTURE    EKG   Radiology No results found.  Procedures Procedures (including critical care time)  Medications Ordered in UC Medications - No data to display  Initial Impression / Assessment and Plan / UC Course  I have reviewed the triage vital signs and the nursing notes.  Pertinent labs & imaging results that were available during my care of the patient were reviewed by me and considered in my medical decision making (see chart for details).    Plan: 1.  The dysuria will be treated with the following: A.  Pyridium every 6 hours as needed to decrease the burning on urination. 2.  The cystitis will be treated with the following: A. Macrobid every 12 hours to treat the infection. B.  Urine culture is pending to ensure that the antibiotic will treat the bacterial infection. C.  Advised patient to increase fluid intake in order to flush the urinary system. 3.  Advised to follow-up with PCP or return to urgent care if symptoms fail to improve. Final Clinical Impressions(s) / UC Diagnoses   Final diagnoses:  Dysuria  Cystitis     Discharge Instructions      Advised take the Macrobid every 12 hours on a regular basis to treat the cystitis. Advised take  the Pyridium every 6 hours to help relieve bladder spasm and dysuria, this may turn your urine a different color. Advised to increase fluid intake in order to flush the urinary system. Advised to follow-up with PCP or return to urgent care if symptoms fail to improve.     ED Prescriptions     Medication Sig Dispense Auth. Provider   nitrofurantoin, macrocrystal-monohydrate, (MACROBID) 100 MG capsule Take 1 capsule (100 mg total) by mouth 2 (two) times daily. 10 capsule Nyoka Lint, PA-C   phenazopyridine (PYRIDIUM) 200 MG tablet Take 1 tablet (200 mg total) by mouth 3 (three) times daily. 6 tablet Nyoka Lint, PA-C      PDMP not reviewed this encounter.   Nyoka Lint, PA-C 03/04/22 1556

## 2022-03-06 LAB — URINE CULTURE: Culture: 50000 — AB

## 2022-03-07 ENCOUNTER — Encounter: Payer: Self-pay | Admitting: Student

## 2022-03-07 ENCOUNTER — Ambulatory Visit (INDEPENDENT_AMBULATORY_CARE_PROVIDER_SITE_OTHER): Payer: BC Managed Care – PPO | Admitting: Student

## 2022-03-07 DIAGNOSIS — N62 Hypertrophy of breast: Secondary | ICD-10-CM

## 2022-03-07 NOTE — Progress Notes (Signed)
Patient is a 51 year old female who underwent bilateral breast reduction with Dr. Marla Roe on 01/05/2022.  Patient presents to the clinic today for postoperative follow-up.  Patient was seen in the clinic on 02/21/2022.  At the visit, patient reported she was doing well.  On exam, there was a little bit of erythema noted to the medial aspect of the breast bilaterally.  It did not appear infectious, but patient was instructed to monitor it closely.  Plan is for patient to follow-up in 2 weeks or sooner if she had any concerns.  Today, patient reports she is doing well.  She states that the redness is still present, but it has not really changed.  Patient does believe it is her bra that may be causing some irritation.  She denies any other issues or concerns at this time.  She denies any fevers or chills.  Chaperone present on exam.  On exam, patient is sitting upright in no acute distress.  Her breasts are soft and symmetric bilaterally.  Incisions appear to have healed well.  There is a small area on the right inframammary incision that appears consistent with a suture knot underneath the skin.  There is no surrounding erythema.  There is a little bit of mild erythema medially bilaterally, a little more to the left breast than the right.  There does appear to be a little bit of fat necrosis medially on the left breast.  There are no subcutaneous fluid collections palpated on exam.  It appears to be irritation rather than an infection.  I discussed with the patient that she should continue to monitor the area closely.  I discussed that she should continue to massage the area of firmness as this is most likely fat necrosis.  I discussed with the patient that if she feels the redness worsens, if it spreads, if it becomes swollen, or if she develops fevers or chills, she needs to let us know.  Patient expressed understanding.  We will plan to see the patient back in 1 week for reevaluation.  I instructed the  patient to call if she has any concerns or questions in the meantime.  Pictures were obtained of the patient and placed in the chart with the patient's or guardian's permission.

## 2022-03-14 ENCOUNTER — Ambulatory Visit (INDEPENDENT_AMBULATORY_CARE_PROVIDER_SITE_OTHER): Payer: Self-pay | Admitting: Student

## 2022-03-14 VITALS — BP 141/95 | HR 100 | Temp 98.3°F

## 2022-03-14 DIAGNOSIS — N62 Hypertrophy of breast: Secondary | ICD-10-CM

## 2022-03-14 NOTE — Progress Notes (Signed)
Patient is a 51 year old female who underwent bilateral breast reduction with Dr. Marla Roe on 01/05/2022.  Patient presents to the clinic today for postoperative follow-up.  Patient was last seen in the clinic on 03/07/2022.  At this visit, patient reported she was doing well.  She states that the redness was still present, but she did not experience any changes and she did not experience any fevers or chills.  On exam, her incisions appear to have healed well.  There were some mild erythema bilaterally to the medial aspects of the breast, a little more to the left breast in the right breast.  There were no fluid collections palpated on exam.  Plan was for patient to monitor the area closely and to follow-up in 1 week.  Today, patient reports she is doing well.  She states that she feels she is coming down with an upper respiratory cold.  She states she feels that she has a little bit of congestion.  She denies any fevers or chills.  She states she is overall feeling okay otherwise.  Patient reports the area to her left breast is still red.  She denies any other issues or complaints at this time.  Chaperone present on exam.  On exam, patient is sitting upright in no acute distress.  Vital signs were obtained and were stable.  Patient is afebrile.  Breasts are fairly symmetric bilaterally.  There is some very mild erythema noted to the medial left breast and a little bit of swelling.  It appears to be improved from the previous exam.  There is some underlying firmness in this area that appears to be consistent with fat necrosis.  There are no subcutaneous fluid collections palpated and there is no fluctuance noted.  The mild erythema appears to be more irritation rather than infection.  Incisions are intact and healing nicely bilaterally.  I discussed with the patient to continue to closely monitor the area and to monitor her symptoms.  I discussed with the patient that this appears to be more irritation  from fat necrosis rather than an infectious source.  I discussed with the patient that she should continue to gently massage this area.  I discussed with the patient that if the firmness and the mild redness continued to persist, we may have to get imaging in the future.  Patient expressed understanding.  Patient to follow-up in 1 month for reevaluation.  I discussed with the patient that if she has any concerns at all in the meantime, that she should call us.  I discussed with the patient that if she becomes febrile or if she feels the area is worsening, she should let us know.  Patient expressed understanding.  Pictures were obtained of the patient and placed in the chart with the patient's or guardian's permission.   I discussed the objective findings and plan with Dr. Marla Roe

## 2022-04-07 ENCOUNTER — Ambulatory Visit (INDEPENDENT_AMBULATORY_CARE_PROVIDER_SITE_OTHER): Payer: BC Managed Care – PPO | Admitting: Family Medicine

## 2022-04-07 ENCOUNTER — Encounter (INDEPENDENT_AMBULATORY_CARE_PROVIDER_SITE_OTHER): Payer: Self-pay | Admitting: Family Medicine

## 2022-04-07 VITALS — BP 137/74 | HR 86 | Temp 98.0°F | Ht 60.0 in | Wt 128.0 lb

## 2022-04-07 DIAGNOSIS — F419 Anxiety disorder, unspecified: Secondary | ICD-10-CM

## 2022-04-07 DIAGNOSIS — E88819 Insulin resistance, unspecified: Secondary | ICD-10-CM | POA: Diagnosis not present

## 2022-04-07 DIAGNOSIS — E7849 Other hyperlipidemia: Secondary | ICD-10-CM

## 2022-04-07 DIAGNOSIS — E669 Obesity, unspecified: Secondary | ICD-10-CM

## 2022-04-07 DIAGNOSIS — Z6825 Body mass index (BMI) 25.0-25.9, adult: Secondary | ICD-10-CM

## 2022-04-07 DIAGNOSIS — E559 Vitamin D deficiency, unspecified: Secondary | ICD-10-CM | POA: Diagnosis not present

## 2022-04-07 DIAGNOSIS — F32A Depression, unspecified: Secondary | ICD-10-CM

## 2022-04-07 MED ORDER — VORTIOXETINE HBR 20 MG PO TABS: 20.0000 mg | ORAL_TABLET | Freq: Every day | ORAL | 1 refills | Status: DC

## 2022-04-07 MED ORDER — VITAMIN D (ERGOCALCIFEROL) 1.25 MG (50000 UNIT) PO CAPS: 50000.0000 [IU] | ORAL_CAPSULE | ORAL | 0 refills | Status: DC

## 2022-04-08 LAB — COMPREHENSIVE METABOLIC PANEL
ALT: 26 IU/L (ref 0–32)
AST: 20 IU/L (ref 0–40)
Albumin/Globulin Ratio: 2.2 (ref 1.2–2.2)
Albumin: 4.4 g/dL (ref 3.9–4.9)
Alkaline Phosphatase: 51 IU/L (ref 44–121)
BUN/Creatinine Ratio: 13 (ref 9–23)
BUN: 11 mg/dL (ref 6–24)
Bilirubin Total: 0.2 mg/dL (ref 0.0–1.2)
CO2: 26 mmol/L (ref 20–29)
Calcium: 9.2 mg/dL (ref 8.7–10.2)
Chloride: 102 mmol/L (ref 96–106)
Creatinine, Ser: 0.83 mg/dL (ref 0.57–1.00)
Globulin, Total: 2 g/dL (ref 1.5–4.5)
Glucose: 80 mg/dL (ref 70–99)
Potassium: 4.1 mmol/L (ref 3.5–5.2)
Sodium: 142 mmol/L (ref 134–144)
Total Protein: 6.4 g/dL (ref 6.0–8.5)
eGFR: 86 mL/min/{1.73_m2} (ref >59)

## 2022-04-08 LAB — HEMOGLOBIN A1C
Est. average glucose Bld gHb Est-mCnc: 103 mg/dL
Hgb A1c MFr Bld: 5.2 % (ref 4.8–5.6)

## 2022-04-08 LAB — LIPID PANEL WITH LDL/HDL RATIO
Cholesterol, Total: 153 mg/dL (ref 100–199)
HDL: 48 mg/dL (ref >39)
LDL Chol Calc (NIH): 94 mg/dL (ref 0–99)
LDL/HDL Ratio: 2 ratio (ref 0.0–3.2)
Triglycerides: 54 mg/dL (ref 0–149)
VLDL Cholesterol Cal: 11 mg/dL (ref 5–40)

## 2022-04-08 LAB — INSULIN, RANDOM: INSULIN: 4.5 u[IU]/mL (ref 2.6–24.9)

## 2022-04-08 LAB — VITAMIN D 25 HYDROXY (VIT D DEFICIENCY, FRACTURES): Vit D, 25-Hydroxy: 39.3 ng/mL (ref 30.0–100.0)

## 2022-04-13 ENCOUNTER — Ambulatory Visit (INDEPENDENT_AMBULATORY_CARE_PROVIDER_SITE_OTHER): Payer: BC Managed Care – PPO | Admitting: Surgical

## 2022-04-13 DIAGNOSIS — N62 Hypertrophy of breast: Secondary | ICD-10-CM

## 2022-04-13 NOTE — Progress Notes (Signed)
51 year old female status post bilateral breast reduction with Dr. Ulice Bold on 01/05/2022.  She is doing well today, she does not have any issues or concerns.  She did previously have some redness of the left medial breast but this has resolved.  She had some firmness of bilateral breast, left greater than right but this is also improving and not causing her any discomfort.  She is very happy with her reduction, she reports it has been "life-changing".  Chaperone present on exam On exam bilateral NACs are viable, bilateral breast incisions are intact.  No erythema or cellulitic changes noted.  No subcutaneous fluid collection noted with palpation.  Incisions are well-healed.  A/P:  No restrictions at this time. No signs of infection on exam. We discussed use of scar creams if she would like. All of her questions were answered to her content.  Follow-up as needed.  Picture taken and placed in her chart with her permission.

## 2022-04-20 NOTE — Progress Notes (Signed)
Chief Complaint:   OBESITY Aimee Gray is here to discuss her progress with her obesity treatment plan along with follow-up of her obesity related diagnoses. Aimee Gray is on the Category 2 Plan and states she is following her eating plan approximately 80% of the time. Aimee Gray states she is doing strength training 20-25 minutes 4 times per week.  Today's visit was #: 54 Starting weight: 156 lbs Starting date: 10/28/2019 Today's weight: 128 lbs Today's date: 04/07/2022 Total lbs lost to date: 28 lbs Total lbs lost since last in-office visit: 1  Interim History: Karelys feeling more down than normal with end daylight savings. She has been sick for weeks. She has hit protein goals most days. Has been cooking cauliflower rice more frequently. Sodas have trickled back in. She did indulge in some Halloween candy.  Subjective:   1. Other hyperlipidemia Aimee Gray's last LDL at 115, HDL at 44, Trigly at 83. She is not currently taking medications.  2. Vitamin D deficiency Aimee Gray's Vit D level last done in May 2023 at 32.5. Denies any nausea, vomiting or muscle weakness. She notes fatigue.  3. Insulin resistance Aimee Gray's last A1c at 5.0, insulin at 5.0. She was previously on Mounjaro.  4. Anxiety and depression Aimee Gray is taking Trintellix and Wellbutrin. Some increase in symptoms with change of seasons.  Assessment/Plan:   1. Other hyperlipidemia We will obtain labs today.  - Lipid Panel With LDL/HDL Ratio  2. Vitamin D deficiency We will obtain labs today. We will refill Vit D 50K IU once a week for 1 month with 0 refills.  -Refill Vitamin D, Ergocalciferol, (DRISDOL) 1.25 MG (50000 UNIT) CAPS capsule; Take 1 capsule (50,000 Units total) by mouth every 7 (seven) days.  Dispense: 4 capsule; Refill: 0  - VITAMIN D 25 Hydroxy (Vit-D Deficiency, Fractures)  3. Insulin resistance We will obtain labs today.  - Comprehensive metabolic panel - Hemoglobin A1c - Insulin, random  4.  Anxiety and depression We will refill Trintellix 20 mg daily for 1 month with 0 refills.  -Refill vortioxetine HBr (TRINTELLIX) 20 MG TABS tablet; Take 1 tablet (20 mg total) by mouth daily.  Dispense: 90 tablet; Refill: 1  5. Obesity with current BMI of 25.1 Aimee Gray is currently in the action stage of change. As such, her goal is to continue with weight loss efforts. She has agreed to the Category 2 Plan.   Exercise goals: Discussed increasing intensity of weight training.  Behavioral modification strategies: increasing lean protein intake, meal planning and cooking strategies, keeping healthy foods in the home, and planning for success.  Aimee Gray has agreed to follow-up with our clinic in 6 weeks. She was informed of the importance of frequent follow-up visits to maximize her success with intensive lifestyle modifications for her multiple health conditions.   Aimee Gray was informed we would discuss her lab results at her next visit unless there is a critical issue that needs to be addressed sooner. Aimee Gray agreed to keep her next visit at the agreed upon time to discuss these results.  Objective:   Blood pressure 137/74, pulse 86, temperature 98 F (36.7 C), height 5' (1.524 m), weight 128 lb (58.1 kg), SpO2 99 %. Body mass index is 25 kg/m.  General: Cooperative, alert, well developed, in no acute distress. HEENT: Conjunctivae and lids unremarkable. Cardiovascular: Regular rhythm.  Lungs: Normal work of breathing. Neurologic: No focal deficits.   Lab Results  Component Value Date   CREATININE 0.83 04/07/2022   BUN 11 04/07/2022  NA 142 04/07/2022   K 4.1 04/07/2022   CL 102 04/07/2022   CO2 26 04/07/2022   Lab Results  Component Value Date   ALT 26 04/07/2022   AST 20 04/07/2022   ALKPHOS 51 04/07/2022   BILITOT 0.2 04/07/2022   Lab Results  Component Value Date   HGBA1C 5.2 04/07/2022   HGBA1C 5.0 10/06/2021   HGBA1C 5.2 10/28/2019   Lab Results  Component Value  Date   INSULIN 4.5 04/07/2022   INSULIN 4.3 10/06/2021   INSULIN 13.9 10/28/2019   Lab Results  Component Value Date   TSH 2.850 10/28/2019   Lab Results  Component Value Date   CHOL 153 04/07/2022   HDL 48 04/07/2022   LDLCALC 94 04/07/2022   TRIG 54 04/07/2022   CHOLHDL 4.4 03/08/2021   Lab Results  Component Value Date   VD25OH 39.3 04/07/2022   VD25OH 32.5 10/06/2021   VD25OH 32.5 10/28/2019   Lab Results  Component Value Date   WBC 10.4 03/08/2021   HGB 14.1 03/08/2021   HCT 40.5 03/08/2021   MCV 91 03/08/2021   PLT 373 03/08/2021   No results found for: "IRON", "TIBC", "FERRITIN"  Attestation Statements:   Reviewed by clinician on day of visit: allergies, medications, problem list, medical history, surgical history, family history, social history, and previous encounter notes.  I, Fortino Sic, RMA am acting as transcriptionist for Reuben Likes, MD.  I have reviewed the above documentation for accuracy and completeness, and I agree with the above. - Reuben Likes, MD

## 2022-04-26 ENCOUNTER — Telehealth: Payer: Self-pay | Admitting: Plastic Surgery

## 2022-04-26 NOTE — Telephone Encounter (Signed)
LVM to have pt contact office to schedule a virtual visit per provider.

## 2022-05-14 ENCOUNTER — Encounter: Payer: Self-pay | Admitting: Neurology

## 2022-05-25 ENCOUNTER — Ambulatory Visit (INDEPENDENT_AMBULATORY_CARE_PROVIDER_SITE_OTHER): Payer: BC Managed Care – PPO | Admitting: Family Medicine

## 2022-05-25 ENCOUNTER — Encounter (INDEPENDENT_AMBULATORY_CARE_PROVIDER_SITE_OTHER): Payer: Self-pay | Admitting: Family Medicine

## 2022-05-25 VITALS — BP 127/83 | HR 90 | Temp 98.3°F | Ht 60.0 in | Wt 135.0 lb

## 2022-05-25 DIAGNOSIS — F419 Anxiety disorder, unspecified: Secondary | ICD-10-CM

## 2022-05-25 DIAGNOSIS — Z6826 Body mass index (BMI) 26.0-26.9, adult: Secondary | ICD-10-CM

## 2022-05-25 DIAGNOSIS — E559 Vitamin D deficiency, unspecified: Secondary | ICD-10-CM

## 2022-05-25 DIAGNOSIS — F32A Depression, unspecified: Secondary | ICD-10-CM

## 2022-05-25 DIAGNOSIS — E669 Obesity, unspecified: Secondary | ICD-10-CM | POA: Diagnosis not present

## 2022-05-25 MED ORDER — VITAMIN D (ERGOCALCIFEROL) 1.25 MG (50000 UNIT) PO CAPS: 50000.0000 [IU] | ORAL_CAPSULE | ORAL | 0 refills | Status: DC

## 2022-05-25 MED ORDER — VORTIOXETINE HBR 20 MG PO TABS: 20.0000 mg | ORAL_TABLET | Freq: Every day | ORAL | 1 refills | Status: DC

## 2022-05-30 ENCOUNTER — Other Ambulatory Visit: Payer: Self-pay | Admitting: Neurology

## 2022-05-30 MED ORDER — EMGALITY 120 MG/ML ~~LOC~~ SOAJ: 120.0000 mg | SUBCUTANEOUS | 11 refills | Status: AC

## 2022-05-30 NOTE — Progress Notes (Signed)
Patient unable to afford [name] due to cost, will switch her to [employer].

## 2022-05-30 NOTE — Telephone Encounter (Signed)
Please call and inform patient that I will switch her to [facility]. It is also a monthly injection.

## 2022-06-04 ENCOUNTER — Other Ambulatory Visit (INDEPENDENT_AMBULATORY_CARE_PROVIDER_SITE_OTHER): Payer: Self-pay | Admitting: Family Medicine

## 2022-06-04 DIAGNOSIS — F32A Depression, unspecified: Secondary | ICD-10-CM

## 2022-06-06 NOTE — Progress Notes (Signed)
Chief Complaint:   OBESITY Aimee Gray is here to discuss her progress with her obesity treatment plan along with follow-up of her obesity related diagnoses. Aimee Gray is on the Category 2 Plan and states she is following her eating plan approximately 60% of the time. Aimee Gray states she is walking dogs 15 minutes 5 times per week.  Today's visit was #: 53 Starting weight: 156 lbs Starting date: 10/28/2019 Today's weight: 135 lbs Today's date: 05/25/2022 Total lbs lost to date: 21 lbs Total lbs lost since last in-office visit: 0  Interim History: Aimee Gray was sick over the holiday season with URI/influenza.  She then had residual cough.  Recognizes she did not do as well taking care of herself as she wanted to.  Was previously doing a good job with meal prep at but realized that she was not doing as much over the holidays.  Subjective:   1. Vitamin D deficiency Aimee Gray is currently taking prescription Vit D 50,000 IU once a week previously.  Vitamin D level of 39.3 at last appointment.  2. Anxiety and depression Aimee Gray is on Trintellix and Wellbutrin daily.  Denies suicidal ideas, and homicidal ideas.  Assessment/Plan:   1. Vitamin D deficiency We will refill Vit D 50K IU once a week for 1 month with 0 refills.  -Refill Vitamin D, Ergocalciferol, (DRISDOL) 1.25 MG (50000 UNIT) CAPS capsule; Take 1 capsule (50,000 Units total) by mouth every 7 (seven) days.  Dispense: 4 capsule; Refill: 0  2. Anxiety and depression We will refill Trintellix 20 mg daily for 3 months with 0 refills.  -Refill vortioxetine HBr (TRINTELLIX) 20 MG TABS tablet; Take 1 tablet (20 mg total) by mouth daily.  Dispense: 90 tablet; Refill: 1  3. Obesity with current BMI of 26.5 Aimee Gray is currently in the action stage of change. As such, her goal is to continue with weight loss efforts. She has agreed to the Category 2 Plan.   Exercise goals: All adults should avoid inactivity. Some physical activity is better  than none, and adults who participate in any amount of physical activity gain some health benefits.  Behavioral modification strategies: increasing lean protein intake, meal planning and cooking strategies, keeping healthy foods in the home, and planning for success.  Aimee Gray has agreed to follow-up with our clinic in 4 weeks. She was informed of the importance of frequent follow-up visits to maximize her success with intensive lifestyle modifications for her multiple health conditions.   Objective:   Blood pressure 127/83, pulse 90, temperature 98.3 F (36.8 C), height 5' (1.524 m), weight 135 lb (61.2 kg), SpO2 99 %. Body mass index is 26.37 kg/m.  General: Cooperative, alert, well developed, in no acute distress. HEENT: Conjunctivae and lids unremarkable. Cardiovascular: Regular rhythm.  Lungs: Normal work of breathing. Neurologic: No focal deficits.   Lab Results  Component Value Date   CREATININE 0.83 04/07/2022   BUN 11 04/07/2022   NA 142 04/07/2022   K 4.1 04/07/2022   CL 102 04/07/2022   CO2 26 04/07/2022   Lab Results  Component Value Date   ALT 26 04/07/2022   AST 20 04/07/2022   ALKPHOS 51 04/07/2022   BILITOT 0.2 04/07/2022   Lab Results  Component Value Date   HGBA1C 5.2 04/07/2022   HGBA1C 5.0 10/06/2021   HGBA1C 5.2 10/28/2019   Lab Results  Component Value Date   INSULIN 4.5 04/07/2022   INSULIN 4.3 10/06/2021   INSULIN 13.9 10/28/2019   Lab Results  Component Value Date   TSH 2.850 10/28/2019   Lab Results  Component Value Date   CHOL 153 04/07/2022   HDL 48 04/07/2022   LDLCALC 94 04/07/2022   TRIG 54 04/07/2022   CHOLHDL 4.4 03/08/2021   Lab Results  Component Value Date   VD25OH 39.3 04/07/2022   VD25OH 32.5 10/06/2021   VD25OH 32.5 10/28/2019   Lab Results  Component Value Date   WBC 10.4 03/08/2021   HGB 14.1 03/08/2021   HCT 40.5 03/08/2021   MCV 91 03/08/2021   PLT 373 03/08/2021   No results found for: "IRON", "TIBC",  "FERRITIN"  Attestation Statements:   Reviewed by clinician on day of visit: allergies, medications, problem list, medical history, surgical history, family history, social history, and previous encounter notes.  I, Elnora Morrison, RMA am acting as transcriptionist for Coralie Common, MD.  I have reviewed the above documentation for accuracy and completeness, and I agree with the above. - Coralie Common, MD

## 2022-06-22 ENCOUNTER — Ambulatory Visit (INDEPENDENT_AMBULATORY_CARE_PROVIDER_SITE_OTHER): Payer: BC Managed Care – PPO | Admitting: Family Medicine

## 2022-06-22 ENCOUNTER — Encounter (INDEPENDENT_AMBULATORY_CARE_PROVIDER_SITE_OTHER): Payer: Self-pay | Admitting: Family Medicine

## 2022-06-22 VITALS — BP 127/79 | HR 96 | Temp 98.1°F | Ht 60.0 in | Wt 135.0 lb

## 2022-06-22 DIAGNOSIS — E559 Vitamin D deficiency, unspecified: Secondary | ICD-10-CM | POA: Diagnosis not present

## 2022-06-22 DIAGNOSIS — E669 Obesity, unspecified: Secondary | ICD-10-CM

## 2022-06-22 DIAGNOSIS — Z6826 Body mass index (BMI) 26.0-26.9, adult: Secondary | ICD-10-CM

## 2022-06-22 DIAGNOSIS — F32A Depression, unspecified: Secondary | ICD-10-CM

## 2022-06-22 DIAGNOSIS — F419 Anxiety disorder, unspecified: Secondary | ICD-10-CM | POA: Diagnosis not present

## 2022-06-22 MED ORDER — VITAMIN D (ERGOCALCIFEROL) 1.25 MG (50000 UNIT) PO CAPS: 50000.0000 [IU] | ORAL_CAPSULE | ORAL | 0 refills | Status: DC

## 2022-06-22 MED ORDER — BUPROPION HCL ER (SR) 150 MG PO TB12: 150.0000 mg | ORAL_TABLET | Freq: Two times a day (BID) | ORAL | 0 refills | Status: DC

## 2022-07-05 NOTE — Progress Notes (Signed)
Chief Complaint:   OBESITY Aimee Gray is here to discuss her progress with her obesity treatment plan along with follow-up of her obesity related diagnoses. Aimee Gray is on the Category 2 Plan and states she is following her eating plan approximately 85% of the time. Aimee Gray states she is walking/strength training 20 minutes 2 times per week.  Today's visit was #: 104 Starting weight: 156 lbs Starting date: 10/28/2019 Today's weight: 135 lbs Today's date: 06/22/2022 Total lbs lost to date: 21 lbs Total lbs lost since last in-office visit: 0  Interim History: Aimee Gray voices that she has struggled with some emotional eating and more depressive type symptoms since last appointment.  She is getting more protein in and staying consistent with her intake there.  Subjective:   1. Vitamin D deficiency Aimee Gray is currently taking prescription Vit D 50,000 IU once a week.   Denies any nausea, vomiting or muscle weakness.   She notes fatigue.  2. Anxiety and depression Aimee Gray is on Wellbutrin and Trintellix.  Denies suicidal ideas, and homicidal ideas.  Significant increase in symptoms recently after son's appointment at his specialist.  Assessment/Plan:   1. Vitamin D deficiency We will refill Vit D 50K IU once a week for 1 month with 0 refills.  -Refill Vitamin D, Ergocalciferol, (DRISDOL) 1.25 MG (50000 UNIT) CAPS capsule; Take 1 capsule (50,000 Units total) by mouth every 7 (seven) days.  Dispense: 4 capsule; Refill: 0  2. Anxiety and depression Increase/refill Wellbutrin SR 150 mg twice a day for 1 month with 0 refills.  -Increase/Refill buPROPion (WELLBUTRIN SR) 150 MG 12 hr tablet; Take 1 tablet (150 mg total) by mouth 2 (two) times daily.  Dispense: 180 tablet; Refill: 0  3. Obesity with current BMI of 26.5 Aimee Gray is currently in the action stage of change. As such, her goal is to continue with weight loss efforts. She has agreed to the Category 2 Plan.   Exercise goals: As  is.  Behavioral modification strategies: increasing lean protein intake, meal planning and cooking strategies, keeping healthy foods in the home, and planning for success.  Aimee Gray has agreed to follow-up with our clinic in 8 weeks. She was informed of the importance of frequent follow-up visits to maximize her success with intensive lifestyle modifications for her multiple health conditions.   Objective:   Blood pressure 127/79, pulse 96, temperature 98.1 F (36.7 C), height 5' (1.524 m), weight 135 lb (61.2 kg), SpO2 100 %. Body mass index is 26.37 kg/m.  General: Cooperative, alert, well developed, in no acute distress. HEENT: Conjunctivae and lids unremarkable. Cardiovascular: Regular rhythm.  Lungs: Normal work of breathing. Neurologic: No focal deficits.   Lab Results  Component Value Date   CREATININE 0.83 04/07/2022   BUN 11 04/07/2022   NA 142 04/07/2022   K 4.1 04/07/2022   CL 102 04/07/2022   CO2 26 04/07/2022   Lab Results  Component Value Date   ALT 26 04/07/2022   AST 20 04/07/2022   ALKPHOS 51 04/07/2022   BILITOT 0.2 04/07/2022   Lab Results  Component Value Date   HGBA1C 5.2 04/07/2022   HGBA1C 5.0 10/06/2021   HGBA1C 5.2 10/28/2019   Lab Results  Component Value Date   INSULIN 4.5 04/07/2022   INSULIN 4.3 10/06/2021   INSULIN 13.9 10/28/2019   Lab Results  Component Value Date   TSH 2.850 10/28/2019   Lab Results  Component Value Date   CHOL 153 04/07/2022   HDL 48 04/07/2022  LDLCALC 94 04/07/2022   TRIG 54 04/07/2022   CHOLHDL 4.4 03/08/2021   Lab Results  Component Value Date   VD25OH 39.3 04/07/2022   VD25OH 32.5 10/06/2021   VD25OH 32.5 10/28/2019   Lab Results  Component Value Date   WBC 10.4 03/08/2021   HGB 14.1 03/08/2021   HCT 40.5 03/08/2021   MCV 91 03/08/2021   PLT 373 03/08/2021   No results found for: "IRON", "TIBC", "FERRITIN"  Attestation Statements:   Reviewed by clinician on day of visit: allergies,  medications, problem list, medical history, surgical history, family history, social history, and previous encounter notes.  I, Elnora Morrison, RMA am acting as transcriptionist for Coralie Common, MD.  I have reviewed the above documentation for accuracy and completeness, and I agree with the above. - Coralie Common, MD

## 2022-07-19 ENCOUNTER — Other Ambulatory Visit (INDEPENDENT_AMBULATORY_CARE_PROVIDER_SITE_OTHER): Payer: Self-pay | Admitting: Family Medicine

## 2022-07-19 DIAGNOSIS — E559 Vitamin D deficiency, unspecified: Secondary | ICD-10-CM

## 2022-07-28 ENCOUNTER — Other Ambulatory Visit (HOSPITAL_BASED_OUTPATIENT_CLINIC_OR_DEPARTMENT_OTHER): Payer: Self-pay

## 2022-07-28 MED ORDER — METHYLPHENIDATE HCL ER (LA) 30 MG PO CP24
30.0000 mg | ORAL_CAPSULE | Freq: Every day | ORAL | 0 refills | Status: DC
  Filled 2022-07-28: qty 30, 30d supply, fill #0

## 2022-08-03 ENCOUNTER — Encounter (INDEPENDENT_AMBULATORY_CARE_PROVIDER_SITE_OTHER): Payer: Self-pay | Admitting: Family Medicine

## 2022-08-03 ENCOUNTER — Ambulatory Visit (INDEPENDENT_AMBULATORY_CARE_PROVIDER_SITE_OTHER): Payer: BC Managed Care – PPO | Admitting: Family Medicine

## 2022-08-03 VITALS — BP 127/83 | HR 89 | Temp 98.4°F | Ht 60.0 in | Wt 139.0 lb

## 2022-08-03 DIAGNOSIS — E559 Vitamin D deficiency, unspecified: Secondary | ICD-10-CM

## 2022-08-03 DIAGNOSIS — F32A Depression, unspecified: Secondary | ICD-10-CM | POA: Diagnosis not present

## 2022-08-03 DIAGNOSIS — Z6827 Body mass index (BMI) 27.0-27.9, adult: Secondary | ICD-10-CM

## 2022-08-03 DIAGNOSIS — Z6830 Body mass index (BMI) 30.0-30.9, adult: Secondary | ICD-10-CM

## 2022-08-03 DIAGNOSIS — F419 Anxiety disorder, unspecified: Secondary | ICD-10-CM | POA: Diagnosis not present

## 2022-08-03 DIAGNOSIS — E669 Obesity, unspecified: Secondary | ICD-10-CM

## 2022-08-03 MED ORDER — VITAMIN D (ERGOCALCIFEROL) 1.25 MG (50000 UNIT) PO CAPS: 50000.0000 [IU] | ORAL_CAPSULE | ORAL | 0 refills | Status: DC

## 2022-08-03 MED ORDER — BUPROPION HCL ER (SR) 150 MG PO TB12: 150.0000 mg | ORAL_TABLET | Freq: Two times a day (BID) | ORAL | 0 refills | Status: DC

## 2022-08-03 NOTE — Progress Notes (Deleted)
Patient returns to clinic after 6 weeks.  Has felt up and down with meal plan.  She has strength trained consistently since last appointment.  Food is still challenging for her.  Her son has gone thru a major growth spurt and her meal prep isn't making it thru the week now.  She also recognizes she is a relatively picky eater and so this sometimes increases her meal prep time. Just got back on her ritalin and she has noticed an improvement in her impulse control.  She is going to [place] over spring break with her kids and then going camping wit her husband to celebrate their 15 wedding anniversary.  Thinks she may have been over indulgent in cheese and fattier deli meats.

## 2022-08-10 NOTE — Progress Notes (Signed)
Chief Complaint:   OBESITY Aimee Gray is here to discuss her progress with her obesity treatment plan along with follow-up of her obesity related diagnoses. [name] is on the Category 2 Plan and states she is following her eating plan approximately 85-90% of the time. [name] states she is strength 30 minutes 3 times per week.  Today's visit was #: 13 Starting weight: 156 LBS Starting date: 10/28/2019 Today's weight: 139 LBS Today's date: 08/03/2022 Total lbs lost to date: 17 LBS Total lbs lost since last in-office visit: +4 LBS  Interim History:  Patient returns to clinic after 6 weeks.  Has felt up and down with meal plan.  She has strength trained consistently since last appointment.  Food is still challenging for her.  Her son has gone thru a major growth spurt and her meal prep isn't making it thru the week now.  She also recognizes she is a relatively picky eater and so this sometimes increases her meal prep time. Just got back on her ritalin and she has noticed an improvement in her impulse control.  She is going to [place] over spring break with her kids and then going camping wit her husband to celebrate their 46 wedding anniversary.  Thinks she may have been over indulgent in cheese and fattier deli meats.    Subjective:   1. Vitamin D deficiency Patient is on prescription vitamin D.  Last vitamin D level was 39.3.  Patient denies nausea, vomiting, muscle weakness but is positive for fatigue.  2. Anxiety and depression Patient is doing well on Wellbutrin and Trintellix.  Patient denies suicidal or homicidal ideations.  Assessment/Plan:   1. Vitamin D deficiency Refill- Vitamin D, Ergocalciferol, (DRISDOL) 1.25 MG (50000 UNIT) CAPS capsule; Take 1 capsule (50,000 Units total) by mouth every 7 (seven) days.  Dispense: 4 capsule; Refill: 0  2. Anxiety and depression Refill- buPROPion (WELLBUTRIN SR) 150 MG 12 hr tablet; Take 1 tablet (150 mg total) by mouth 2 (two) times daily.   Dispense: 180 tablet; Refill: 0  3. Obesity with current BMI of 27.1 [name] is currently in the action stage of change. As such, her goal is to continue with weight loss efforts. She has agreed to the Category 2 Plan.   Exercise goals:  As is, patient to focus on increased intensity.  Behavioral modification strategies: increasing lean protein intake, increasing vegetables, meal planning and cooking strategies, and planning for success.  Dorace has agreed to follow-up with our clinic in 4 weeks. She was informed of the importance of frequent follow-up visits to maximize her success with intensive lifestyle modifications for her multiple health conditions.   Objective:   Blood pressure 127/83, pulse 89, temperature 98.4 F (36.9 C), height 5' (1.524 m), weight 139 lb (63 kg), SpO2 99 %. Body mass index is 27.15 kg/m.  General: Cooperative, alert, well developed, in no acute distress. HEENT: Conjunctivae and lids unremarkable. Cardiovascular: Regular rhythm.  Lungs: Normal work of breathing. Neurologic: No focal deficits.   Lab Results  Component Value Date   CREATININE 0.83 04/07/2022   BUN 11 04/07/2022   NA 142 04/07/2022   K 4.1 04/07/2022   CL 102 04/07/2022   CO2 26 04/07/2022   Lab Results  Component Value Date   ALT 26 04/07/2022   AST 20 04/07/2022   ALKPHOS 51 04/07/2022   BILITOT 0.2 04/07/2022   Lab Results  Component Value Date   HGBA1C 5.2 04/07/2022   HGBA1C 5.0 10/06/2021  HGBA1C 5.2 10/28/2019   Lab Results  Component Value Date   INSULIN 4.5 04/07/2022   INSULIN 4.3 10/06/2021   INSULIN 13.9 10/28/2019   Lab Results  Component Value Date   TSH 2.850 10/28/2019   Lab Results  Component Value Date   CHOL 153 04/07/2022   HDL 48 04/07/2022   LDLCALC 94 04/07/2022   TRIG 54 04/07/2022   CHOLHDL 4.4 03/08/2021   Lab Results  Component Value Date   VD25OH 39.3 04/07/2022   VD25OH 32.5 10/06/2021   VD25OH 32.5 10/28/2019   Lab Results   Component Value Date   WBC 10.4 03/08/2021   HGB 14.1 03/08/2021   HCT 40.5 03/08/2021   MCV 91 03/08/2021   PLT 373 03/08/2021   No results found for: "IRON", "TIBC", "FERRITIN"  Attestation Statements:   Reviewed by clinician on day of visit: allergies, medications, problem list, medical history, surgical history, family history, social history, and previous encounter notes.  I, Davy Pique, RMA, am acting as transcriptionist for Coralie Common, MD.  I have reviewed the above documentation for accuracy and completeness, and I agree with the above. - Coralie Common, MD

## 2022-09-01 ENCOUNTER — Encounter (INDEPENDENT_AMBULATORY_CARE_PROVIDER_SITE_OTHER): Payer: Self-pay | Admitting: Family Medicine

## 2022-09-01 ENCOUNTER — Ambulatory Visit (INDEPENDENT_AMBULATORY_CARE_PROVIDER_SITE_OTHER): Payer: BC Managed Care – PPO | Admitting: Family Medicine

## 2022-09-01 VITALS — BP 130/79 | HR 75 | Temp 98.4°F | Ht 63.0 in | Wt 140.0 lb

## 2022-09-01 DIAGNOSIS — E559 Vitamin D deficiency, unspecified: Secondary | ICD-10-CM

## 2022-09-01 DIAGNOSIS — F908 Attention-deficit hyperactivity disorder, other type: Secondary | ICD-10-CM

## 2022-09-01 DIAGNOSIS — Z6827 Body mass index (BMI) 27.0-27.9, adult: Secondary | ICD-10-CM

## 2022-09-01 DIAGNOSIS — E669 Obesity, unspecified: Secondary | ICD-10-CM

## 2022-09-01 MED ORDER — VITAMIN D (ERGOCALCIFEROL) 1.25 MG (50000 UNIT) PO CAPS: 50000.0000 [IU] | ORAL_CAPSULE | ORAL | 0 refills | Status: DC

## 2022-09-01 NOTE — Progress Notes (Signed)
Chief Complaint:   OBESITY Aimee Gray is here to discuss her progress with her obesity treatment plan along with follow-up of her obesity related diagnoses. Aimee Gray is on the Category 2 Plan and states she is following her eating plan approximately 60% of the time. Aimee Gray states she is walking 15-30 minutes 5 times per week.  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  She is fixing food for her meals that is in line with her meal plan but has been struggling to control her indulgent snacking of carbohydrate.  She feels frustrated with weight gain after being on mounjaro.  She has been recently switched from Ritalin to Adderral because she was not able to focus on Ritalin.  Subjective:   1. Vitamin D deficiency Patient is on prescription Vitamin D.  Patient denies nausea, vomiting, muscle weakness, but is positive for fatigue.  2. Attention deficit hyperactivity disorder (ADHD), other type Patient is switching from Ritalin to Adderall.  Ritalin is not working.  Assessment/Plan:   1. Vitamin D deficiency Refill- Vitamin D, Ergocalciferol, (DRISDOL) 1.25 MG (50000 UNIT) CAPS capsule; Take 1 capsule (50,000 Units total) by mouth every 7 (seven) days.  Dispense: 4 capsule; Refill: 0  2. Attention deficit hyperactivity disorder (ADHD), other type Adderall 15 mg is prescribed by Dr. Kathe Mariner at [facility].   3. Obesity with current BMI of 27.4 Aimee Gray is currently in the action stage of change. As such, her goal is to continue with weight loss efforts. She has agreed to the Category 2 Plan.   Exercise goals: All adults should avoid inactivity. Some physical activity is better than none, and adults who participate in any amount of physical  activity gain some health benefits.  Behavioral modification strategies: increasing lean protein intake, meal planning and cooking strategies, keeping healthy foods in the home, and planning for success.  Aimee Gray has agreed to follow-up with our clinic in 5-6 weeks. She was informed of the importance of frequent follow-up visits to maximize her success with intensive lifestyle modifications for her multiple health conditions.   Objective:   Blood pressure 130/79, pulse 75, temperature 98.4 F (36.9 C), height 5\' 3"  (1.6 m), weight 140 lb (63.5 kg), SpO2 100 %. Body mass index is 24.8 kg/m.  General: Cooperative, alert, well developed, in no acute distress. HEENT: Conjunctivae and lids unremarkable. Cardiovascular: Regular rhythm.  Lungs: Normal work of breathing. Neurologic: No focal deficits.   Lab Results  Component Value Date   CREATININE 0.83 04/07/2022   BUN 11 04/07/2022   NA 142 04/07/2022   K 4.1 04/07/2022   CL 102 04/07/2022   CO2 26 04/07/2022   Lab Results  Component Value Date   ALT 26 04/07/2022   AST 20 04/07/2022   ALKPHOS 51 04/07/2022   BILITOT 0.2 04/07/2022   Lab Results  Component Value Date   HGBA1C 5.2 04/07/2022   HGBA1C 5.0 10/06/2021   HGBA1C 5.2 10/28/2019   Lab Results  Component Value Date   INSULIN 4.5 04/07/2022   INSULIN 4.3 10/06/2021   INSULIN 13.9 10/28/2019   Lab Results  Component Value Date   TSH 2.850 10/28/2019   Lab Results  Component Value Date   CHOL 153 04/07/2022   HDL  48 04/07/2022   LDLCALC 94 04/07/2022   TRIG 54 04/07/2022   CHOLHDL 4.4 03/08/2021   Lab Results  Component Value Date   VD25OH 39.3 04/07/2022   VD25OH 32.5 10/06/2021   VD25OH 32.5 10/28/2019   Lab Results  Component Value Date   WBC 10.4 03/08/2021   HGB 14.1 03/08/2021   HCT 40.5 03/08/2021   MCV 91 03/08/2021   PLT 373 03/08/2021   No results found for: "IRON", "TIBC", "FERRITIN"  Attestation Statements:   Reviewed by  clinician on day of visit: allergies, medications, problem list, medical history, surgical history, family history, social history, and previous encounter notes.  I, Malcolm Metro, RMA, am acting as transcriptionist for Reuben Likes, MD.  I have reviewed the above documentation for accuracy and completeness, and I agree with the above. - Reuben Likes, MD

## 2022-09-28 ENCOUNTER — Other Ambulatory Visit: Payer: Self-pay | Admitting: Neurology

## 2022-09-28 DIAGNOSIS — G43101 Migraine with aura, not intractable, with status migrainosus: Secondary | ICD-10-CM

## 2022-10-13 ENCOUNTER — Encounter (INDEPENDENT_AMBULATORY_CARE_PROVIDER_SITE_OTHER): Payer: Self-pay | Admitting: Family Medicine

## 2022-10-13 ENCOUNTER — Ambulatory Visit (INDEPENDENT_AMBULATORY_CARE_PROVIDER_SITE_OTHER): Payer: BC Managed Care – PPO | Admitting: Family Medicine

## 2022-10-13 VITALS — BP 122/81 | HR 86 | Temp 99.0°F | Ht 60.0 in | Wt 137.0 lb

## 2022-10-13 DIAGNOSIS — F32A Depression, unspecified: Secondary | ICD-10-CM | POA: Diagnosis not present

## 2022-10-13 DIAGNOSIS — Z6826 Body mass index (BMI) 26.0-26.9, adult: Secondary | ICD-10-CM

## 2022-10-13 DIAGNOSIS — E669 Obesity, unspecified: Secondary | ICD-10-CM

## 2022-10-13 DIAGNOSIS — F419 Anxiety disorder, unspecified: Secondary | ICD-10-CM | POA: Diagnosis not present

## 2022-10-13 DIAGNOSIS — R232 Flushing: Secondary | ICD-10-CM | POA: Diagnosis not present

## 2022-10-13 MED ORDER — VORTIOXETINE HBR 20 MG PO TABS: 20.0000 mg | ORAL_TABLET | Freq: Every day | ORAL | 1 refills | Status: DC

## 2022-10-13 NOTE — Progress Notes (Signed)
Chief Complaint:   OBESITY Aimee Gray is here to discuss her progress with her obesity treatment plan along with follow-up of her obesity related diagnoses. Aimee Gray is on the Category 2 Plan and states she is following her eating plan approximately 85% of the time. Aimee Gray states she is walking for 15 minutes 7 times per week.  Today's visit was #: 40 Starting weight: 156 lbs Starting date: 10/28/2019 Today's weight: 137 lbs Today's date: 10/13/2022 Total lbs lost to date: 19 Total lbs lost since last in-office visit: 3  Interim History: Patient has been planting and gardening and weeding.  She had to cancel her anniversary trip because her father in law is struggling with diverticulitis.  She is not sleeping well and she is wondering if she having hormonal issues.  She is having significant sweating and emotionally lability.   Subjective:   1. Hot flashes Patient has not been to her GYN in a while.  She is having some side effects of perimenopause.  2. Anxiety and depression Patient is on Trintellix and Wellbutrin.  She denies suicidal or homicidal ideations.  Assessment/Plan:   1. Hot flashes We will follow-up with the patient on her treatment plan after being seen by the GYN.  2. Anxiety and depression Patient will continue Trintellix 20 mg daily, and we will refill for 90 days.  - vortioxetine HBr (TRINTELLIX) 20 MG TABS tablet; Take 1 tablet (20 mg total) by mouth daily.  Dispense: 90 tablet; Refill: 1  3. BMI 26.0-26.9,adult  4. Obesity with starting BMI of 30.4 Aimee Gray is currently in the action stage of change. As such, her goal is to continue with weight loss efforts. She has agreed to the Category 2 Plan.   We will repeat fasting labs at her next appointment.  Exercise goals: All adults should avoid inactivity. Some physical activity is better than none, and adults who participate in any amount of physical activity gain some health benefits.  5-minute resistant  activity 4 times per week.  Behavioral modification strategies: increasing lean protein intake, meal planning and cooking strategies, keeping healthy foods in the home, and planning for success.  Aimee Gray has agreed to follow-up with our clinic in 5 weeks. She was informed of the importance of frequent follow-up visits to maximize her success with intensive lifestyle modifications for her multiple health conditions.   Objective:   Blood pressure 122/81, pulse 86, temperature 99 F (37.2 C), height 5' (1.524 m), weight 137 lb (62.1 kg), SpO2 98 %. Body mass index is 26.76 kg/m.  General: Cooperative, alert, well developed, in no acute distress. HEENT: Conjunctivae and lids unremarkable. Cardiovascular: Regular rhythm.  Lungs: Normal work of breathing. Neurologic: No focal deficits.   Lab Results  Component Value Date   CREATININE 0.83 04/07/2022   BUN 11 04/07/2022   NA 142 04/07/2022   K 4.1 04/07/2022   CL 102 04/07/2022   CO2 26 04/07/2022   Lab Results  Component Value Date   ALT 26 04/07/2022   AST 20 04/07/2022   ALKPHOS 51 04/07/2022   BILITOT 0.2 04/07/2022   Lab Results  Component Value Date   HGBA1C 5.2 04/07/2022   HGBA1C 5.0 10/06/2021   HGBA1C 5.2 10/28/2019   Lab Results  Component Value Date   INSULIN 4.5 04/07/2022   INSULIN 4.3 10/06/2021   INSULIN 13.9 10/28/2019   Lab Results  Component Value Date   TSH 2.850 10/28/2019   Lab Results  Component Value Date  CHOL 153 04/07/2022   HDL 48 04/07/2022   LDLCALC 94 04/07/2022   TRIG 54 04/07/2022   CHOLHDL 4.4 03/08/2021   Lab Results  Component Value Date   VD25OH 39.3 04/07/2022   VD25OH 32.5 10/06/2021   VD25OH 32.5 10/28/2019   Lab Results  Component Value Date   WBC 10.4 03/08/2021   HGB 14.1 03/08/2021   HCT 40.5 03/08/2021   MCV 91 03/08/2021   PLT 373 03/08/2021   No results found for: "IRON", "TIBC", "FERRITIN"  Attestation Statements:   Reviewed by clinician on day of  visit: allergies, medications, problem list, medical history, surgical history, family history, social history, and previous encounter notes.    I, Burt Knack, am acting as transcriptionist for Reuben Likes, MD.  I have reviewed the above documentation for accuracy and completeness, and I agree with the above. - Reuben Likes, MD

## 2022-11-14 ENCOUNTER — Ambulatory Visit (INDEPENDENT_AMBULATORY_CARE_PROVIDER_SITE_OTHER): Payer: BC Managed Care – PPO | Admitting: Family Medicine

## 2022-11-14 ENCOUNTER — Encounter (INDEPENDENT_AMBULATORY_CARE_PROVIDER_SITE_OTHER): Payer: Self-pay | Admitting: Family Medicine

## 2022-11-14 VITALS — BP 113/70 | HR 74 | Temp 97.7°F | Ht 60.0 in | Wt 141.0 lb

## 2022-11-14 DIAGNOSIS — E669 Obesity, unspecified: Secondary | ICD-10-CM | POA: Diagnosis not present

## 2022-11-14 DIAGNOSIS — E7849 Other hyperlipidemia: Secondary | ICD-10-CM | POA: Diagnosis not present

## 2022-11-14 DIAGNOSIS — E88819 Insulin resistance, unspecified: Secondary | ICD-10-CM

## 2022-11-14 DIAGNOSIS — Z6827 Body mass index (BMI) 27.0-27.9, adult: Secondary | ICD-10-CM

## 2022-11-14 DIAGNOSIS — E559 Vitamin D deficiency, unspecified: Secondary | ICD-10-CM

## 2022-11-14 NOTE — Progress Notes (Unsigned)
Chief Complaint:   OBESITY Aimee Gray is here to discuss her progress with her obesity treatment plan along with follow-up of her obesity related diagnoses. Aimee Gray is on the Category 2 Plan and states she is following her eating plan approximately 85% of the time. Aimee Gray states she is walking for 15 minutes 7 times per week.  Today's visit was #: 41 Starting weight: 156 lbs Starting date: 10/28/2019 Today's weight: 141 lbs Today's date: 11/14/2022 Total lbs lost to date: 15 Total lbs lost since last in-office visit: 0  Interim History: Patient voices that last night she thinks she had an ovarian cyst burst last night and is in pretty significant pain today. This is her first week really off after school got out.  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  She has taken advantage of summer break and is reading again.  Aimee Gray is more on board with planning meals and being more aware of nutrition. She recognizes that once school let out she dropped breakfast and that has made it difficult to get all her protein and calories in.   Subjective:   1. Insulin resistance Patient's last A1c was 5.2 and insulin 4.5.  Previously on [facility] with great results.  2. Vitamin D deficiency Patient is on prescription vitamin D, and she notes fatigue.  3. Other hyperlipidemia Patient is not on medications.  Her LDL was previously 94, HDL 48, and triglycerides 54.  Assessment/Plan:   1. Insulin resistance We will check labs today, and we will follow-up at patient's next appointment.  - Comprehensive metabolic panel - Hemoglobin A1c - Insulin, random  2. Vitamin D deficiency We will check labs today, and we will follow-up at patient's next appointment.  - VITAMIN D 25 Hydroxy (Vit-D Deficiency, Fractures)  3. Other hyperlipidemia We will check labs today, and we will follow-up at patient's next appointment.  - Lipid Panel  With LDL/HDL Ratio  4. BMI 27.0-27.9,adult  5. Obesity with starting BMI of 30.4 Aimee Gray is currently in the action stage of change. As such, her goal is to continue with weight loss efforts. She has agreed to the Category 2 Plan.   Exercise goals: All adults should avoid inactivity. Some physical activity is better than none, and adults who participate in any amount of physical activity gain some health benefits.  Behavioral modification strategies: increasing lean protein intake, meal planning and cooking strategies, keeping healthy foods in the home, and planning for success.  Aimee Gray has agreed to follow-up with our clinic in 4 weeks. She was informed of the importance of frequent follow-up visits to maximize her success with intensive lifestyle modifications for her multiple health conditions.   Aimee Gray was informed we would discuss her lab results at her next visit unless there is a critical issue that needs to be addressed sooner. Aimee Gray agreed to keep her next visit at the agreed upon time to discuss these results.  Objective:   Blood pressure 113/70, pulse 74, temperature 97.7 F (36.5 C), height 5' (1.524 m), weight 141 lb (64 kg), SpO2 100 %. Body mass index is 27.54 kg/m.  General: Cooperative, alert, well developed, in no acute distress. HEENT: Conjunctivae and lids unremarkable. Cardiovascular: Regular rhythm.  Lungs: Normal work of breathing. Neurologic: No focal deficits.   Lab Results  Component Value Date   CREATININE 0.83 11/14/2022   BUN 11 11/14/2022   NA 141 11/14/2022   K  4.2 11/14/2022   CL 103 11/14/2022   CO2 24 11/14/2022   Lab Results  Component Value Date   ALT 17 11/14/2022   AST 15 11/14/2022   ALKPHOS 53 11/14/2022   BILITOT 0.4 11/14/2022   Lab Results  Component Value Date   HGBA1C 5.3 11/14/2022   HGBA1C 5.2 04/07/2022   HGBA1C 5.0 10/06/2021   HGBA1C 5.2 10/28/2019   Lab Results  Component Value Date   INSULIN 8.2 11/14/2022    INSULIN 4.5 04/07/2022   INSULIN 4.3 10/06/2021   INSULIN 13.9 10/28/2019   Lab Results  Component Value Date   TSH 2.850 10/28/2019   Lab Results  Component Value Date   CHOL 172 11/14/2022   HDL 44 11/14/2022   LDLCALC 111 (H) 11/14/2022   TRIG 91 11/14/2022   CHOLHDL 4.4 03/08/2021   Lab Results  Component Value Date   VD25OH 31.9 11/14/2022   VD25OH 39.3 04/07/2022   VD25OH 32.5 10/06/2021   Lab Results  Component Value Date   WBC 10.4 03/08/2021   HGB 14.1 03/08/2021   HCT 40.5 03/08/2021   MCV 91 03/08/2021   PLT 373 03/08/2021   No results found for: "IRON", "TIBC", "FERRITIN"  Attestation Statements:   Reviewed by clinician on day of visit: allergies, medications, problem list, medical history, surgical history, family history, social history, and previous encounter notes.   I, Burt Knack, am acting as transcriptionist for Reuben Likes, MD.  I have reviewed the above documentation for accuracy and completeness, and I agree with the above. - Reuben Likes, MD

## 2022-11-15 LAB — COMPREHENSIVE METABOLIC PANEL
ALT: 17 IU/L (ref 0–32)
AST: 15 IU/L (ref 0–40)
Albumin: 4.1 g/dL (ref 3.9–4.9)
Alkaline Phosphatase: 53 IU/L (ref 44–121)
BUN/Creatinine Ratio: 13 (ref 9–23)
BUN: 11 mg/dL (ref 6–24)
Bilirubin Total: 0.4 mg/dL (ref 0.0–1.2)
CO2: 24 mmol/L (ref 20–29)
Calcium: 9.1 mg/dL (ref 8.7–10.2)
Chloride: 103 mmol/L (ref 96–106)
Creatinine, Ser: 0.83 mg/dL (ref 0.57–1.00)
Globulin, Total: 1.8 g/dL (ref 1.5–4.5)
Glucose: 79 mg/dL (ref 70–99)
Potassium: 4.2 mmol/L (ref 3.5–5.2)
Sodium: 141 mmol/L (ref 134–144)
Total Protein: 5.9 g/dL — ABNORMAL LOW (ref 6.0–8.5)
eGFR: 86 mL/min/{1.73_m2} (ref >59)

## 2022-11-15 LAB — LIPID PANEL WITH LDL/HDL RATIO
Cholesterol, Total: 172 mg/dL (ref 100–199)
HDL: 44 mg/dL (ref >39)
LDL Chol Calc (NIH): 111 mg/dL — ABNORMAL HIGH (ref 0–99)
LDL/HDL Ratio: 2.5 ratio (ref 0.0–3.2)
Triglycerides: 91 mg/dL (ref 0–149)
VLDL Cholesterol Cal: 17 mg/dL (ref 5–40)

## 2022-11-15 LAB — INSULIN, RANDOM: INSULIN: 8.2 u[IU]/mL (ref 2.6–24.9)

## 2022-11-15 LAB — HEMOGLOBIN A1C
Est. average glucose Bld gHb Est-mCnc: 105 mg/dL
Hgb A1c MFr Bld: 5.3 % (ref 4.8–5.6)

## 2022-11-15 LAB — VITAMIN D 25 HYDROXY (VIT D DEFICIENCY, FRACTURES): Vit D, 25-Hydroxy: 31.9 ng/mL (ref 30.0–100.0)

## 2022-12-07 ENCOUNTER — Encounter: Payer: Self-pay | Admitting: Neurology

## 2022-12-07 ENCOUNTER — Ambulatory Visit: Payer: BC Managed Care – PPO | Admitting: Neurology

## 2022-12-07 DIAGNOSIS — G43101 Migraine with aura, not intractable, with status migrainosus: Secondary | ICD-10-CM | POA: Diagnosis not present

## 2022-12-07 MED ORDER — RIZATRIPTAN BENZOATE 10 MG PO TBDP: ORAL_TABLET | ORAL | 11 refills | Status: DC

## 2022-12-07 MED ORDER — EMGALITY 120 MG/ML ~~LOC~~ SOAJ: 120.0000 mg | SUBCUTANEOUS | 11 refills | Status: DC

## 2022-12-07 MED ORDER — ONDANSETRON HCL 4 MG PO TABS: 4.0000 mg | ORAL_TABLET | Freq: Three times a day (TID) | ORAL | 6 refills | Status: DC | PRN

## 2022-12-07 NOTE — Progress Notes (Signed)
[facility]  PATIENT: Aimee Gray DOB: [birth year]  REFERRING CLINICIAN: No ref. provider found HISTORY FROM: Patient  REASON FOR VISIT: Migraines follow up    HISTORICAL  CHIEF COMPLAINT:  Chief Complaint  Patient presents with   Follow-up    Rm 13, alone, migraines stable, 2-3 migraine days a month (weather related)   INTERVAL HISTORY 12/07/2022 Patient presents today for follow-up, she is alone.  Last visit was a year ago since then she has been doing well on Emgality.  [name] was not approved by her insurance therefore we switched her to [name]  She reports 1-2 headaches per month for which she takes [name] as needed which helped.  Currently she is satisfied with her frequency.  She does reports heat intolerance, waking up in the middle of the night with sweats but feels it might be related to menopause.  No other concerns, no other questions.   INTERVAL HISTORY 12/06/2021:  Patient presents today for follow-up, at last visit in January plan was to start Botox treatment but it was not approved.  Since then she has been started on [name] and reports a good improvement in her headaches.  In the past she used to get 1-2 headache per week but currently with [name] she has month without headaches.  She reports recently with the summer heat and working outside in her garden causes some additional headaches but the [name] seems to help.  Patient is satisfied with her current headache frequency   INTERNAL HISTORY 06/07/2021:  Patient present today for follow-up, last visit was in October at that time plan was to start gabapentin as preventive medication since since it has helped patient in the past and to also start Nurtec as abortive medication.  She reported her headache frequency is about the same, she will get 1-2 migraine per week, Nurtec is not effective now she went back to [name].  She was not able to complete MRI brain due to insurance issues. Again for  preventive medication patient has tried amitriptyline, topiramate, propanolol and gabapentin.   HISTORY OF PRESENT ILLNESS:  This is a 52 year old woman with past medical history of anxiety depression, insomnia and prediabetes who is presenting for migraines.  Patient stated she has a been diagnosed with migraines since her childhood.  For her migraines, they typically started behind the left eye, sometimes it can switch to the right eye.  Her migraine has been fairly controlled with medication, she report when she was pregnant did not have any episodes but after giving birth the migraine restarted.  Patient reported lately her migraines have increased in frequency, she is getting about 10 migraine days per month.  Her only medication is [name] and is currently ineffective.  She has tried multiple preventive medications including amitriptyline, topiramate, gabapentin, and propanolol.  For abortive medication she has tried Tylenol, ibuprofen and [name].  Migraines are associated with nausea vomiting, she reported she has to stay in a dark room.  She has a strong family history of migraine including her sister and her 2 nieces.   Headache History and Characteristics: Onset: Since child  Location: Behind left eye but can switch between left and right eye  Quality: Excruciating pain, sometimes can be throbbing  Intensity: 6-8/10.  Duration: 12-24 hrs Migrainous Features: Photophobia, phonophobia, nausea, vomiting.  Aura: No  History of brain injury or tumor: No  Family history: Sister and niece with Migraines  Motion sickness: no Cardiac history: no  OTC: tylenol, Ibuprofen  Caffeine:  Yes  Sleep: not great, will get up to 7 hours some night, often time cannot sleep without a sleep aid  Mood/ Stress: Teacher, 2nd grade   Prior prophylaxis: Propranolol: Yes Verapamil:No TCA: Yes Topamax: Yes, brain fog Depakote: No Effexor: No Cymbalta: No Neurontin:Yes   Prior abortives: Triptan:  [name] Anti-emetic: No Steroids: No Ergotamine suppository: No   OTHER MEDICAL CONDITIONS: Depression, Insomnia, Anxiety, Pre-diabetes    REVIEW OF SYSTEMS: Full 14 system review of systems performed and negative with exception of: as noted in the HPI  ALLERGIES: Allergies  Allergen Reactions   Hydrocodone Nausea And Vomiting    HOME MEDICATIONS: Outpatient Medications Prior to Visit  Medication Sig Dispense Refill   amphetamine-dextroamphetamine (ADDERALL XR) 15 MG 24 hr capsule Take 15 mg by mouth every morning.     buPROPion (WELLBUTRIN SR) 150 MG 12 hr tablet Take 1 tablet (150 mg total) by mouth 2 (two) times daily. 180 tablet 0   levonorgestrel (MIRENA) 20 MCG/24HR IUD 1 each by Intrauterine route once.     vortioxetine HBr (TRINTELLIX) 20 MG TABS tablet Take 1 tablet (20 mg total) by mouth daily. 90 tablet 1   ondansetron (ZOFRAN) 4 MG tablet Take 1 tablet (4 mg total) by mouth every 8 (eight) hours as needed for up to 20 doses for nausea or vomiting. 20 tablet 0   rizatriptan ([name]-MLT) 10 MG disintegrating tablet DISSOLVE 1 TAB BY MOUTH AS NEEDED FOR MIGRAINE. MAY REPEAT IN 2 HOURS IF NEEDED. 10 tablet 6   Vitamin D, Ergocalciferol, (DRISDOL) 1.25 MG (50000 UNIT) CAPS capsule Take 1 capsule (50,000 Units total) by mouth every 7 (seven) days. (Patient not taking: Reported on 12/07/2022) 4 capsule 0   No facility-administered medications prior to visit.    PAST MEDICAL HISTORY: Past Medical History:  Diagnosis Date   Allergy    Anxiety    Back pain    Depression    GERD (gastroesophageal reflux disease)    Infertility, female    Lactose intolerance    Migraines    Palpitations    PCOS (polycystic ovarian syndrome)    PONV (postoperative nausea and vomiting)     PAST SURGICAL HISTORY: Past Surgical History:  Procedure Laterality Date   BREAST REDUCTION SURGERY Bilateral 01/05/2022   Procedure: MAMMARY REDUCTION  (BREAST);  Surgeon: Peggye Form, DO;   Location: [facility];  Service: Plastics;  Laterality: Bilateral;   CESAREAN SECTION      FAMILY HISTORY: Family History  Problem Relation Age of Onset   Heart disease Mother    Hypertension Mother    Thyroid disease Mother    Depression Mother    Anxiety disorder Mother    Sleep apnea Mother    Obesity Mother    Diabetes Father    Heart disease Father    Hyperlipidemia Father    Hypertension Father    Cancer Maternal Grandmother    Diabetes Maternal Grandfather    Stroke Maternal Grandfather    Heart disease Paternal Grandmother    Hypertension Paternal Grandmother    Heart disease Paternal Grandfather    Breast cancer Neg Hx     SOCIAL HISTORY: Social History   Socioeconomic History   Marital status: Married    Spouse name: Not on file   Number of children: Not on file   Years of education: Not on file   Highest education level: Not on file  Occupational History   Not on file  Tobacco Use  Smoking status: Former   Smokeless tobacco: Former    Quit date: 2000   Tobacco comments:    quit 15 years ago  Vaping Use   Vaping status: Not on file  Substance and Sexual Activity   Alcohol use: No   Drug use: No   Sexual activity: Never  Other Topics Concern   Not on file  Social History Narrative   Right handed   Lives with husband and 2 kids   Caffeine-1-2 cups daily   Social Determinants of Health   Financial Resource Strain: Not on file  Food Insecurity: Not on file  Transportation Needs: Not on file  Physical Activity: Not on file  Stress: Not on file  Social Connections: Not on file  Intimate Partner Violence: Not on file     PHYSICAL EXAM  GENERAL EXAM/CONSTITUTIONAL: Vitals:  Vitals:   12/07/22 0919  BP: (!) 136/90  Pulse: 81  SpO2: 99%  Weight: 145 lb (65.8 kg)  Height: 5' (1.524 m)   Body mass index is 28.32 kg/m. Wt Readings from Last 3 Encounters:  12/07/22 145 lb (65.8 kg)  11/14/22 141 lb (64 kg)  10/13/22 137  lb (62.1 kg)   Patient is in no distress; well developed, nourished and groomed; neck is supple  MUSCULOSKELETAL: Gait, strength, tone, movements noted in Neurologic exam below  NEUROLOGIC: MENTAL STATUS:      No data to display         awake, alert, oriented to person, place and time recent and remote memory intact normal attention and concentration language fluent, comprehension intact, naming intact fund of knowledge appropriate  CRANIAL NERVE:  2nd, 3rd, 4th, 6th - pupils equal and reactive to light, visual fields full to confrontation, extraocular muscles intact, no nystagmus 5th - facial sensation symmetric 7th - facial strength symmetric 8th - hearing intact 9th - palate elevates symmetrically, uvula midline 11th - shoulder shrug symmetric 12th - tongue protrusion midline  MOTOR:  normal bulk and tone, full strength in the BUE, BLE  SENSORY:  normal and symmetric to light touch  COORDINATION:  finger-nose-finger, fine finger movements normal  GAIT/STATION:  normal   DIAGNOSTIC DATA (LABS, IMAGING, TESTING) - I reviewed patient records, labs, notes, testing and imaging myself where available.  Lab Results  Component Value Date   WBC 10.4 03/08/2021   HGB 14.1 03/08/2021   HCT 40.5 03/08/2021   MCV 91 03/08/2021   PLT 373 03/08/2021      Component Value Date/Time   NA 141 11/14/2022 0901   K 4.2 11/14/2022 0901   CL 103 11/14/2022 0901   CO2 24 11/14/2022 0901   GLUCOSE 79 11/14/2022 0901   BUN 11 11/14/2022 0901   CREATININE 0.83 11/14/2022 0901   CALCIUM 9.1 11/14/2022 0901   PROT 5.9 (L) 11/14/2022 0901   ALBUMIN 4.1 11/14/2022 0901   AST 15 11/14/2022 0901   ALT 17 11/14/2022 0901   ALKPHOS 53 11/14/2022 0901   BILITOT 0.4 11/14/2022 0901   GFRNONAA 102 10/28/2019 1146   GFRAA 118 10/28/2019 1146   Lab Results  Component Value Date   CHOL 172 11/14/2022   HDL 44 11/14/2022   LDLCALC 111 (H) 11/14/2022   TRIG 91 11/14/2022    CHOLHDL 4.4 03/08/2021   Lab Results  Component Value Date   HGBA1C 5.3 11/14/2022   Lab Results  Component Value Date   VITAMINB12 245 10/28/2019   Lab Results  Component Value Date   TSH 2.850 10/28/2019  ASSESSMENT AND PLAN  52 y.o. year old female with past medical history of anxiety depression, insomnia, prediabetes, ADD and headaches who is presenting for migraine follow up.  She is doing well on Emgality now as [name] was no longer approved by insurance.  1-2 headaches per month for which [name] helped.  Plan for now is to continue patient on Emgality and [name] as needed.  Follow-up in 1 year or sooner if worse.   1. Migraine with aura and with status migrainosus, not intractable    \  Patient Instructions  Continue with Emgality monthly for migraine prevention Continue with [name] as needed for acute headaches Continue with Zofran as needed for nausea Continue to follow with PCP Return in 1 year, can follow-up with NP.    No orders of the defined types were placed in this encounter.   Meds ordered this encounter  Medications   Galcanezumab-gnlm (EMGALITY) 120 MG/ML SOAJ    Sig: Inject 120 mg into the skin every 30 (thirty) days.    Dispense:  1.12 mL    Refill:  11   rizatriptan ([name]-MLT) 10 MG disintegrating tablet    Sig: DISSOLVE 1 TAB BY MOUTH AS NEEDED FOR MIGRAINE. MAY REPEAT IN 2 HOURS IF NEEDED.    Dispense:  10 tablet    Refill:  11   ondansetron (ZOFRAN) 4 MG tablet    Sig: Take 1 tablet (4 mg total) by mouth every 8 (eight) hours as needed for up to 140 doses for nausea or vomiting.    Dispense:  20 tablet    Refill:  6    Return in about 1 year (around 12/07/2023).   Windell Norfolk, MD 12/07/2022, 12:53 PM  [facility] [address] [place], [place] [address] [phone]

## 2022-12-07 NOTE — Patient Instructions (Signed)
Continue with Emgality monthly for migraine prevention Continue with [name] as needed for acute headaches Continue with Zofran as needed for nausea Continue to follow with PCP Return in 1 year, can follow-up with NP.

## 2022-12-12 ENCOUNTER — Encounter (INDEPENDENT_AMBULATORY_CARE_PROVIDER_SITE_OTHER): Payer: Self-pay | Admitting: Family Medicine

## 2022-12-12 ENCOUNTER — Ambulatory Visit (INDEPENDENT_AMBULATORY_CARE_PROVIDER_SITE_OTHER): Payer: BC Managed Care – PPO | Admitting: Family Medicine

## 2022-12-12 VITALS — BP 124/63 | HR 80 | Temp 98.2°F | Ht 60.0 in | Wt 143.0 lb

## 2022-12-12 DIAGNOSIS — E559 Vitamin D deficiency, unspecified: Secondary | ICD-10-CM

## 2022-12-12 DIAGNOSIS — F419 Anxiety disorder, unspecified: Secondary | ICD-10-CM | POA: Diagnosis not present

## 2022-12-12 DIAGNOSIS — F32A Depression, unspecified: Secondary | ICD-10-CM | POA: Diagnosis not present

## 2022-12-12 DIAGNOSIS — Z6828 Body mass index (BMI) 28.0-28.9, adult: Secondary | ICD-10-CM

## 2022-12-12 DIAGNOSIS — E669 Obesity, unspecified: Secondary | ICD-10-CM | POA: Diagnosis not present

## 2022-12-12 DIAGNOSIS — Z6827 Body mass index (BMI) 27.0-27.9, adult: Secondary | ICD-10-CM

## 2022-12-12 MED ORDER — BUPROPION HCL ER (SR) 150 MG PO TB12: 150.0000 mg | ORAL_TABLET | Freq: Two times a day (BID) | ORAL | 0 refills | Status: DC

## 2022-12-12 MED ORDER — VITAMIN D (ERGOCALCIFEROL) 1.25 MG (50000 UNIT) PO CAPS: 50000.0000 [IU] | ORAL_CAPSULE | ORAL | 0 refills | Status: DC

## 2022-12-12 NOTE — Progress Notes (Signed)
Chief Complaint:   OBESITY Aimee Gray is here to discuss her progress with her obesity treatment plan along with follow-up of her obesity related diagnoses. Aimee Gray is on the Category 2 Plan and states she is following her eating plan approximately 85% of the time. Aimee Gray states she is walking 2 miles 5-6 times per week, and strengthening for 30 minutes 3 times per week.  Today's visit was #: 42 Starting weight: 156 lbs Starting date: 10/28/2019 Today's weight: 143 lbs Today's date: 12/12/2022 Total lbs lost to date: 13 Total lbs lost since last in-office visit: 0  Interim History: Since last appointment patient mentions all of her vehicles have struggled and she is going through a "financial catastrophe".  This has led her to some rumination.  She has been strength training about 3 days a week for about 30 minutes.  She is trying to be mindful of reps to increase total reps.  She is working on lower back strength.  She is walking in the am now. She has had some binges sporadically recently.   Subjective:   1. Vitamin D deficiency Patient is on prescription vitamin D.  She denies nausea, vomiting, or muscle weakness but notes fatigue.  Last lab appointment, vitamin D level was of 31.9.  2. Anxiety and depression Patient denies suicidal or homicidal ideations but she has noticed an increase in anger symptoms recently.  She is wondering if menopause is playing into her symptoms.  Assessment/Plan:   1. Vitamin D deficiency We will refill prescription vitamin D 50,000 IU once weekly for 1 month.  - Vitamin D, Ergocalciferol, (DRISDOL) 1.25 MG (50000 UNIT) CAPS capsule; Take 1 capsule (50,000 Units total) by mouth every 7 (seven) days.  Dispense: 4 capsule; Refill: 0  2. Anxiety and depression We will refill Wellbutrin SR 150 mg twice daily for 90 days.  - buPROPion (WELLBUTRIN SR) 150 MG 12 hr tablet; Take 1 tablet (150 mg total) by mouth 2 (two) times daily.  Dispense: 180 tablet;  Refill: 0  3. BMI 28.0-28.9,adult  4. Obesity with starting BMI of 30.4 Aimee Gray is currently in the action stage of change. As such, her goal is to continue with weight loss efforts. She has agreed to the Category 2 Plan.   Exercise goals: As is.   Behavioral modification strategies: increasing lean protein intake, meal planning and cooking strategies, keeping healthy foods in the home, and planning for success.  Aimee Gray has agreed to follow-up with our clinic in 5 weeks. She was informed of the importance of frequent follow-up visits to maximize her success with intensive lifestyle modifications for her multiple health conditions.   Objective:   Blood pressure 124/63, pulse 80, temperature 98.2 F (36.8 C), height 5' (1.524 m), weight 143 lb (64.9 kg), SpO2 99%. Body mass index is 27.93 kg/m.  General: Cooperative, alert, well developed, in no acute distress. HEENT: Conjunctivae and lids unremarkable. Cardiovascular: Regular rhythm.  Lungs: Normal work of breathing. Neurologic: No focal deficits.   Lab Results  Component Value Date   CREATININE 0.83 11/14/2022   BUN 11 11/14/2022   NA 141 11/14/2022   K 4.2 11/14/2022   CL 103 11/14/2022   CO2 24 11/14/2022   Lab Results  Component Value Date   ALT 17 11/14/2022   AST 15 11/14/2022   ALKPHOS 53 11/14/2022   BILITOT 0.4 11/14/2022   Lab Results  Component Value Date   HGBA1C 5.3 11/14/2022   HGBA1C 5.2 04/07/2022   HGBA1C  5.0 10/06/2021   HGBA1C 5.2 10/28/2019   Lab Results  Component Value Date   INSULIN 8.2 11/14/2022   INSULIN 4.5 04/07/2022   INSULIN 4.3 10/06/2021   INSULIN 13.9 10/28/2019   Lab Results  Component Value Date   TSH 2.850 10/28/2019   Lab Results  Component Value Date   CHOL 172 11/14/2022   HDL 44 11/14/2022   LDLCALC 111 (H) 11/14/2022   TRIG 91 11/14/2022   CHOLHDL 4.4 03/08/2021   Lab Results  Component Value Date   VD25OH 31.9 11/14/2022   VD25OH 39.3 04/07/2022   VD25OH  32.5 10/06/2021   Lab Results  Component Value Date   WBC 10.4 03/08/2021   HGB 14.1 03/08/2021   HCT 40.5 03/08/2021   MCV 91 03/08/2021   PLT 373 03/08/2021   No results found for: "IRON", "TIBC", "FERRITIN"  Attestation Statements:   Reviewed by clinician on day of visit: allergies, medications, problem list, medical history, surgical history, family history, social history, and previous encounter notes.   I, Burt Knack, am acting as transcriptionist for Reuben Likes, MD.  I have reviewed the above documentation for accuracy and completeness, and I agree with the above. - Reuben Likes, MD

## 2023-01-11 LAB — HM PAP SMEAR: HPV, high-risk: NEGATIVE

## 2023-01-17 ENCOUNTER — Ambulatory Visit (INDEPENDENT_AMBULATORY_CARE_PROVIDER_SITE_OTHER): Payer: BC Managed Care – PPO | Admitting: Family Medicine

## 2023-01-17 ENCOUNTER — Encounter (INDEPENDENT_AMBULATORY_CARE_PROVIDER_SITE_OTHER): Payer: Self-pay | Admitting: Family Medicine

## 2023-01-17 VITALS — BP 130/76 | HR 99 | Temp 98.4°F | Ht 60.0 in | Wt 141.0 lb

## 2023-01-17 DIAGNOSIS — E669 Obesity, unspecified: Secondary | ICD-10-CM | POA: Diagnosis not present

## 2023-01-17 DIAGNOSIS — F419 Anxiety disorder, unspecified: Secondary | ICD-10-CM | POA: Diagnosis not present

## 2023-01-17 DIAGNOSIS — F32A Depression, unspecified: Secondary | ICD-10-CM

## 2023-01-17 DIAGNOSIS — Z6827 Body mass index (BMI) 27.0-27.9, adult: Secondary | ICD-10-CM

## 2023-01-17 DIAGNOSIS — E559 Vitamin D deficiency, unspecified: Secondary | ICD-10-CM | POA: Diagnosis not present

## 2023-01-17 MED ORDER — VITAMIN D (ERGOCALCIFEROL) 1.25 MG (50000 UNIT) PO CAPS
50000.0000 [IU] | ORAL_CAPSULE | ORAL | 0 refills | Status: DC
Start: 2023-01-17 — End: 2023-06-22

## 2023-01-17 MED ORDER — VORTIOXETINE HBR 20 MG PO TABS
20.0000 mg | ORAL_TABLET | Freq: Every day | ORAL | 1 refills | Status: DC
Start: 2023-01-17 — End: 2023-06-22

## 2023-01-17 NOTE — Progress Notes (Signed)
Chief Complaint:   OBESITY Tolanda is here to discuss her progress with her obesity treatment plan along with follow-up of her obesity related diagnoses. Raini is on the Category 2 Plan and states she is following her eating plan approximately 80% of the time. Malori states she is walking 2 miles, and doing strengthening 4-5 times per week.    Today's visit was #: 43 Starting weight: 156 lbs Starting date: 10/28/2019 Today's weight: 141 lbs Today's date: 01/17/2023 Total lbs lost to date: 15 Total lbs lost since last in-office visit: 2  Interim History: Patient just got back to work and she is very stressed. She has had a harder start to the year due to the significant increase in student body.   Subjective:   1. Vitamin D deficiency Patient is on prescription vitamin D, and her last vitamin D level was 31.9.  She denies nausea, vomiting, or muscle weakness but notes fatigue.  2. Anxiety and depression Patient is on Trintellix daily.  She notes some symptoms are better controlled on her medication, and some symptoms are still not best controlled due to concomitant ADHD.  Assessment/Plan:   1. Vitamin D deficiency Patient will continue prescription vitamin D once weekly, and we will refill for 90 days.  - Vitamin D, Ergocalciferol, (DRISDOL) 1.25 MG (50000 UNIT) CAPS capsule; Take 1 capsule (50,000 Units total) by mouth every 7 (seven) days.  Dispense: 12 capsule; Refill: 0  2. Anxiety and depression Patient will continue Trintellix 20 mg once daily with a 90-day supply, with 1 refill.  - vortioxetine HBr (TRINTELLIX) 20 MG TABS tablet; Take 1 tablet (20 mg total) by mouth daily.  Dispense: 90 tablet; Refill: 1  3. BMI 27.0-27.9,adult  4. Obesity with starting BMI of 30.4 Michaelyn is currently in the action stage of change. As such, her goal is to continue with weight loss efforts. She has agreed to the Category 2 Plan.   Exercise goals: All adults should avoid  inactivity. Some physical activity is better than none, and adults who participate in any amount of physical activity gain some health benefits.  Behavioral modification strategies: increasing lean protein intake, meal planning and cooking strategies, keeping healthy foods in the home, and planning for success.  Sashi has agreed to follow-up with our clinic in 16 weeks. She was informed of the importance of frequent follow-up visits to maximize her success with intensive lifestyle modifications for her multiple health conditions.   Objective:   Blood pressure 130/76, pulse 99, temperature 98.4 F (36.9 C), height 5' (1.524 m), weight 141 lb (64 kg), SpO2 99%. Body mass index is 27.54 kg/m.  General: Cooperative, alert, well developed, in no acute distress. HEENT: Conjunctivae and lids unremarkable. Cardiovascular: Regular rhythm.  Lungs: Normal work of breathing. Neurologic: No focal deficits.   Lab Results  Component Value Date   CREATININE 0.83 11/14/2022   BUN 11 11/14/2022   NA 141 11/14/2022   K 4.2 11/14/2022   CL 103 11/14/2022   CO2 24 11/14/2022   Lab Results  Component Value Date   ALT 17 11/14/2022   AST 15 11/14/2022   ALKPHOS 53 11/14/2022   BILITOT 0.4 11/14/2022   Lab Results  Component Value Date   HGBA1C 5.3 11/14/2022   HGBA1C 5.2 04/07/2022   HGBA1C 5.0 10/06/2021   HGBA1C 5.2 10/28/2019   Lab Results  Component Value Date   INSULIN 8.2 11/14/2022   INSULIN 4.5 04/07/2022   INSULIN 4.3 10/06/2021  INSULIN 13.9 10/28/2019   Lab Results  Component Value Date   TSH 2.850 10/28/2019   Lab Results  Component Value Date   CHOL 172 11/14/2022   HDL 44 11/14/2022   LDLCALC 111 (H) 11/14/2022   TRIG 91 11/14/2022   CHOLHDL 4.4 03/08/2021   Lab Results  Component Value Date   VD25OH 31.9 11/14/2022   VD25OH 39.3 04/07/2022   VD25OH 32.5 10/06/2021   Lab Results  Component Value Date   WBC 10.4 03/08/2021   HGB 14.1 03/08/2021   HCT  40.5 03/08/2021   MCV 91 03/08/2021   PLT 373 03/08/2021   No results found for: "IRON", "TIBC", "FERRITIN"  Attestation Statements:   Reviewed by clinician on day of visit: allergies, medications, problem list, medical history, surgical history, family history, social history, and previous encounter notes.   I, Burt Knack, am acting as transcriptionist for Reuben Likes, MD.  I have reviewed the above documentation for accuracy and completeness, and I agree with the above. - Reuben Likes, MD

## 2023-02-16 ENCOUNTER — Encounter: Payer: Self-pay | Admitting: Neurology

## 2023-02-21 ENCOUNTER — Other Ambulatory Visit (HOSPITAL_COMMUNITY): Payer: Self-pay

## 2023-02-21 ENCOUNTER — Telehealth: Payer: Self-pay

## 2023-02-21 NOTE — Telephone Encounter (Signed)
Pharmacy Patient Advocate Encounter   Received notification from Patient Advice Request messages that prior authorization for Emgality 120MG /ML auto-injectors (migraine) is required/requested.   Insurance verification completed.   The patient is insured through [facility] .   Per test claim: PA required; PA submitted to [facility] via CoverMyMeds Key/confirmation #/EOC [id number] Status is pending

## 2023-02-22 ENCOUNTER — Other Ambulatory Visit (HOSPITAL_COMMUNITY): Payer: Self-pay

## 2023-02-22 NOTE — Telephone Encounter (Signed)
Pharmacy Patient Advocate Encounter  Received notification from [facility] that Prior Authorization for Emgality 120MG /ML auto-injectors (migraine) has been APPROVED from 02/21/2023 to 02/21/2024. Ran test claim, Copay is $Unable to obtain due to Refill Too Soon rejection, LFD was 02/11/2023 and the NFD is 03/06/2023. This test claim was processed through [facility]- copay amounts may vary at other pharmacies due to pharmacy/plan contracts, or as the patient moves through the different stages of their insurance plan.   PA #/Case ID/Reference #: PA# [facility] Plan [phone] Non-Grandfathered [phone] KJ

## 2023-02-23 LAB — HM COLONOSCOPY

## 2023-05-05 LAB — HM MAMMOGRAPHY

## 2023-05-29 ENCOUNTER — Ambulatory Visit (INDEPENDENT_AMBULATORY_CARE_PROVIDER_SITE_OTHER): Payer: 59 | Admitting: Family Medicine

## 2023-06-20 ENCOUNTER — Encounter: Payer: Self-pay | Admitting: Internal Medicine

## 2023-06-22 ENCOUNTER — Ambulatory Visit (INDEPENDENT_AMBULATORY_CARE_PROVIDER_SITE_OTHER): Payer: 59 | Admitting: Family Medicine

## 2023-06-22 ENCOUNTER — Encounter (INDEPENDENT_AMBULATORY_CARE_PROVIDER_SITE_OTHER): Payer: Self-pay | Admitting: Family Medicine

## 2023-06-22 VITALS — BP 133/89 | HR 83 | Temp 98.6°F | Ht 60.0 in | Wt 139.0 lb

## 2023-06-22 DIAGNOSIS — Z6830 Body mass index (BMI) 30.0-30.9, adult: Secondary | ICD-10-CM

## 2023-06-22 DIAGNOSIS — Z6827 Body mass index (BMI) 27.0-27.9, adult: Secondary | ICD-10-CM

## 2023-06-22 DIAGNOSIS — F419 Anxiety disorder, unspecified: Secondary | ICD-10-CM

## 2023-06-22 DIAGNOSIS — E559 Vitamin D deficiency, unspecified: Secondary | ICD-10-CM | POA: Diagnosis not present

## 2023-06-22 DIAGNOSIS — F32A Depression, unspecified: Secondary | ICD-10-CM | POA: Diagnosis not present

## 2023-06-22 DIAGNOSIS — E669 Obesity, unspecified: Secondary | ICD-10-CM | POA: Diagnosis not present

## 2023-06-22 MED ORDER — BUPROPION HCL ER (SR) 150 MG PO TB12
150.0000 mg | ORAL_TABLET | Freq: Two times a day (BID) | ORAL | 0 refills | Status: AC
Start: 2023-06-22 — End: ?

## 2023-06-22 MED ORDER — VORTIOXETINE HBR 20 MG PO TABS: 20.0000 mg | ORAL_TABLET | Freq: Every day | ORAL | 1 refills | Status: AC

## 2023-06-22 MED ORDER — VITAMIN D (ERGOCALCIFEROL) 1.25 MG (50000 UNIT) PO CAPS: 50000.0000 [IU] | ORAL_CAPSULE | ORAL | 0 refills | Status: AC

## 2023-06-22 NOTE — Progress Notes (Signed)
SUBJECTIVE:  Chief Complaint: Obesity  Interim History: Patient has been mostly surviving since she was last seen.  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  She has had some drama relating to her mother and her father. She is currently the adult for her parents and her kids.  Went to 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   Aimee Gray is here to discuss her progress with her obesity treatment plan. She is on the Category 2 Plan and states she is following her eating plan approximately 50 % of the time. She states she is exercising 30 minutes 4 times per week.   OBJECTIVE: Visit Diagnoses: Problem List Items Addressed This Visit       Other   Anxiety and depression   Patient doing well on combination Wellbutrin and Trintellix.  She needs refills of both medications today.  Voices she has no suicidal or homicidal ideation.  Does notice some fluctuating symptoms with her hormone levels.  Is planning to talk to GYN if she wants to discuss hormone replacement.  3 months worth of prescription sent into pharmacy.      Relevant Medications   buPROPion (WELLBUTRIN SR) 150 MG 12 hr tablet   vortioxetine HBr (TRINTELLIX) 20 MG TABS tablet   Vitamin D deficiency   Patient doing well on vitamin D supplementation.  She needs a refill of her prescription strength vitamin D.  Will get labs done at primary care as patient has been released from this program.      Relevant Medications   Vitamin D, Ergocalciferol, (DRISDOL) 1.25 MG (50000 UNIT) CAPS capsule      06/22/2023    3:00 PM 01/17/2023    4:09 PM 01/17/2023    4:00 PM  Vitals with BMI  Height 5\' 0"   5\' 0"   Weight 139 lbs  141 lbs  BMI 27.15  27.54  Systolic 133 130 66  Diastolic 89 76 42  Pulse 83  99    No data recorded  No data recorded  No data recorded  No data recorded    ASSESSMENT AND  PLAN:  Diet: Aimee Gray is currently in the action stage of change. As such, her goal is to continue with weight loss efforts. She has agreed to Category 2 Plan.  Exercise: Aimee Gray has been instructed to work up to a goal of 150 minutes of combined cardio and strengthening exercise per week and that some exercise is better than none for weight loss and overall health benefits.   Behavior Modification:  We discussed the following Behavioral Modification Strategies today: increasing lean protein intake, increasing vegetables, meal planning and cooking strategies, better snacking choices, emotional eating strategies , and planning for success.   Patient has done well and not just obtaining weight loss been able to keep it off in spite of losing insurance coverage for [facility].  At this time no further follow-ups are necessary as patient has been able to be mindful of food intake without becoming obsessive.  She is aware that she is our patient for the next 1 year and after that if she would like to see [name] again she would need to restart the program.  Attestation Statements:   Reviewed by clinician on day of visit: allergies, medications, problem list, medical history, surgical history, family history, social history, and previous encounter notes.  Reuben Likes, MD

## 2023-06-27 NOTE — Assessment & Plan Note (Signed)
 Patient doing well on combination Wellbutrin  and Trintellix .  She needs refills of both medications today.  Voices she has no suicidal or homicidal ideation.  Does notice some fluctuating symptoms with her hormone levels.  Is planning to talk to GYN if she wants to discuss hormone replacement.  3 months worth of prescription sent into pharmacy.

## 2023-06-27 NOTE — Assessment & Plan Note (Signed)
Patient doing well on vitamin D supplementation.  She needs a refill of her prescription strength vitamin D.  Will get labs done at primary care as patient has been released from this program.

## 2023-07-19 ENCOUNTER — Ambulatory Visit (INDEPENDENT_AMBULATORY_CARE_PROVIDER_SITE_OTHER): Payer: 59 | Admitting: Internal Medicine

## 2023-07-19 ENCOUNTER — Encounter: Payer: Self-pay | Admitting: Internal Medicine

## 2023-07-19 VITALS — HR 88 | Temp 98.1°F | Ht 60.0 in | Wt 143.2 lb

## 2023-07-19 DIAGNOSIS — E559 Vitamin D deficiency, unspecified: Secondary | ICD-10-CM | POA: Diagnosis not present

## 2023-07-19 DIAGNOSIS — K219 Gastro-esophageal reflux disease without esophagitis: Secondary | ICD-10-CM

## 2023-07-19 DIAGNOSIS — Z23 Encounter for immunization: Secondary | ICD-10-CM | POA: Diagnosis not present

## 2023-07-19 DIAGNOSIS — F32A Depression, unspecified: Secondary | ICD-10-CM

## 2023-07-19 DIAGNOSIS — F419 Anxiety disorder, unspecified: Secondary | ICD-10-CM

## 2023-07-19 MED ORDER — PANTOPRAZOLE SODIUM 40 MG PO TBEC
40.0000 mg | DELAYED_RELEASE_TABLET | Freq: Every day | ORAL | 1 refills | Status: DC
Start: 2023-07-19 — End: 2023-09-18

## 2023-07-19 NOTE — Addendum Note (Signed)
 Addended by: Kern Reap B on: 07/19/2023 03:26 PM   Modules accepted: Orders

## 2023-07-19 NOTE — Progress Notes (Signed)
 New Patient Office Visit     CC/Reason for Visit: Establish care, discuss chronic and acute concerns Previous PCP: None Last Visit: Unknown  HPI: Aimee Gray is a 53 y.o. female who is coming in today for the above mentioned reasons. Past Medical History is significant for: Depression, anxiety, ADHD, migraine headaches she is a Chartered loss adjuster.  She has been dealing with some dyspepsia including nausea, vomiting, sour taste in her mouth.  She has been trying over-the-counter omeprazole with some relief.  She is a former smoker but quit 16 years ago.  She drinks alcohol only occasionally, no allergies.   Past Medical/Surgical History: Past Medical History:  Diagnosis Date   Allergy    Anxiety    Back pain    Depression    GERD (gastroesophageal reflux disease)    Infertility, female    Lactose intolerance    Migraines    Palpitations    PCOS (polycystic ovarian syndrome)    PONV (postoperative nausea and vomiting)     Past Surgical History:  Procedure Laterality Date   BREAST REDUCTION SURGERY Bilateral 01/05/2022   Procedure: MAMMARY REDUCTION  (BREAST);  Surgeon: Peggye Form, DO;  Location: [facility];  Service: Plastics;  Laterality: Bilateral;   CESAREAN SECTION      Social History:  reports that she has quit smoking. She quit smokeless tobacco use about 25 years ago. She reports that she does not drink alcohol and does not use drugs.  Allergies: Allergies  Allergen Reactions   Hydrocodone Nausea And Vomiting    Family History:  Family History  Problem Relation Age of Onset   Heart disease Mother    Hypertension Mother    Thyroid disease Mother    Depression Mother    Anxiety disorder Mother    Sleep apnea Mother    Obesity Mother    Diabetes Father    Heart disease Father    Hyperlipidemia Father    Hypertension Father    Cancer Maternal Grandmother    Diabetes Maternal Grandfather    Stroke Maternal Grandfather    Heart  disease Paternal Grandmother    Hypertension Paternal Grandmother    Heart disease Paternal Grandfather    Breast cancer Neg Hx      Current Outpatient Medications:    amphetamine-dextroamphetamine (ADDERALL XR) 15 MG 24 hr capsule, Take 15 mg by mouth every morning., Disp: , Rfl:    buPROPion (WELLBUTRIN SR) 150 MG 12 hr tablet, Take 1 tablet (150 mg total) by mouth 2 (two) times daily., Disp: 180 tablet, Rfl: 0   cloNIDine (CATAPRES) 0.1 MG tablet, Take 0.1 mg by mouth at bedtime., Disp: , Rfl:    Galcanezumab-gnlm (EMGALITY) 120 MG/ML SOAJ, Inject 120 mg into the skin every 30 (thirty) days., Disp: 1.12 mL, Rfl: 11   levonorgestrel (MIRENA) 20 MCG/24HR IUD, 1 each by Intrauterine route once., Disp: , Rfl:    ondansetron (ZOFRAN) 4 MG tablet, Take 1 tablet (4 mg total) by mouth every 8 (eight) hours as needed for up to 140 doses for nausea or vomiting., Disp: 20 tablet, Rfl: 6   pantoprazole (PROTONIX) 40 MG tablet, Take 1 tablet (40 mg total) by mouth daily., Disp: 90 tablet, Rfl: 1   rizatriptan ([name]-MLT) 10 MG disintegrating tablet, DISSOLVE 1 TAB BY MOUTH AS NEEDED FOR MIGRAINE. MAY REPEAT IN 2 HOURS IF NEEDED., Disp: 10 tablet, Rfl: 11   Vitamin D, Ergocalciferol, (DRISDOL) 1.25 MG (50000 UNIT) CAPS capsule, Take 1  capsule (50,000 Units total) by mouth every 7 (seven) days., Disp: 12 capsule, Rfl: 0   vortioxetine HBr (TRINTELLIX) 20 MG TABS tablet, Take 1 tablet (20 mg total) by mouth daily., Disp: 90 tablet, Rfl: 1  Review of Systems:  Negative except as indicated in HPI.   Physical Exam: Vitals:   07/19/23 1448  Pulse: 88  Temp: 98.1 F (36.7 C)  TempSrc: Oral  SpO2: 98%  Weight: 143 lb 3.2 oz (65 kg)  Height: 5' (1.524 m)   Body mass index is 27.97 kg/m.  Physical Exam Vitals reviewed.  Constitutional:      Appearance: Normal appearance.  HENT:     Head: Normocephalic and atraumatic.  Eyes:     Conjunctiva/sclera: Conjunctivae normal.  Cardiovascular:      Rate and Rhythm: Normal rate and regular rhythm.  Pulmonary:     Effort: Pulmonary effort is normal.     Breath sounds: Normal breath sounds.  Skin:    General: Skin is warm and dry.  Neurological:     General: No focal deficit present.     Mental Status: She is alert and oriented to person, place, and time.  Psychiatric:        Mood and Affect: Mood normal.        Behavior: Behavior normal.        Thought Content: Thought content normal.        Judgment: Judgment normal.       Impression and Plan:  Gastroesophageal reflux disease, unspecified whether esophagitis present -     Pantoprazole Sodium; Take 1 tablet (40 mg total) by mouth daily.  Dispense: 90 tablet; Refill: 1  Anxiety and depression  Vitamin D deficiency  Immunization due  -First shingles and Tdap administered today. -Mood is stable on current doses of Trintellix and bupropion. -Her ADHD is managed by the [facility] and is on Adderall and clonidine.  Time spent: 46 minutes reviewing chart, interviewing and examining patient and formulating plan of care.        Chaya Jan, MD [facility]

## 2023-07-22 IMAGING — MG MM DIGITAL DIAGNOSTIC UNILAT*R* W/ TOMO W/ CAD
8 series · 8 of 20 positions shown · non-contrast
Comparison: Previous exam(s).

CLINICAL DATA: 42-year-old female recalled from screening mammogram
dated 07/23/2021 for a possible right breast asymmetry with
calcifications.

EXAM:
DIGITAL DIAGNOSTIC UNILATERAL RIGHT MAMMOGRAM WITH TOMOSYNTHESIS AND
CAD
TECHNIQUE: Right digital diagnostic mammography and breast tomosynthesis was
performed. The images were evaluated with computer-aided detection.

[R ML (1 of 2)]
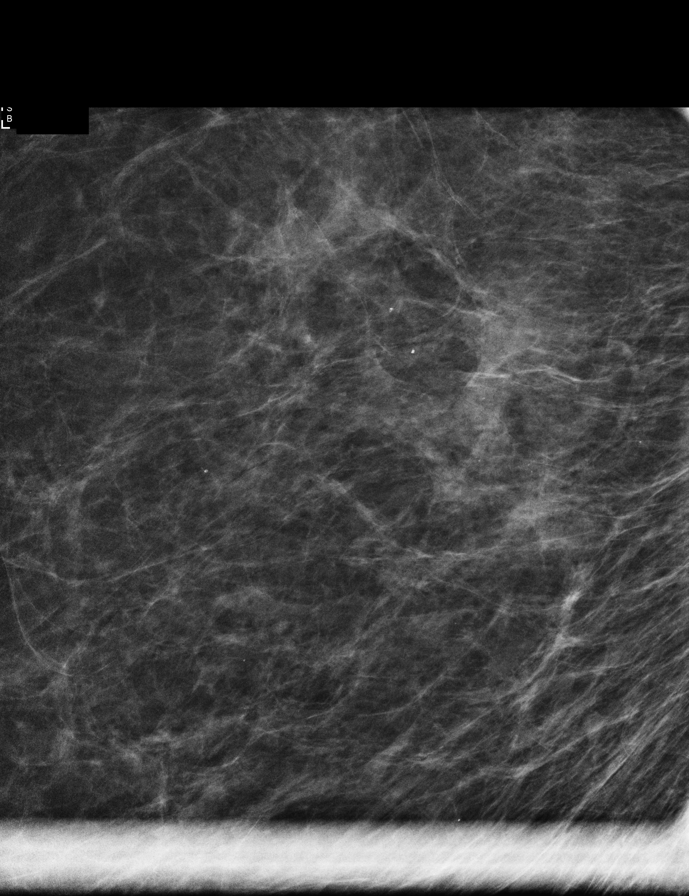

[R ML (2 of 2)]
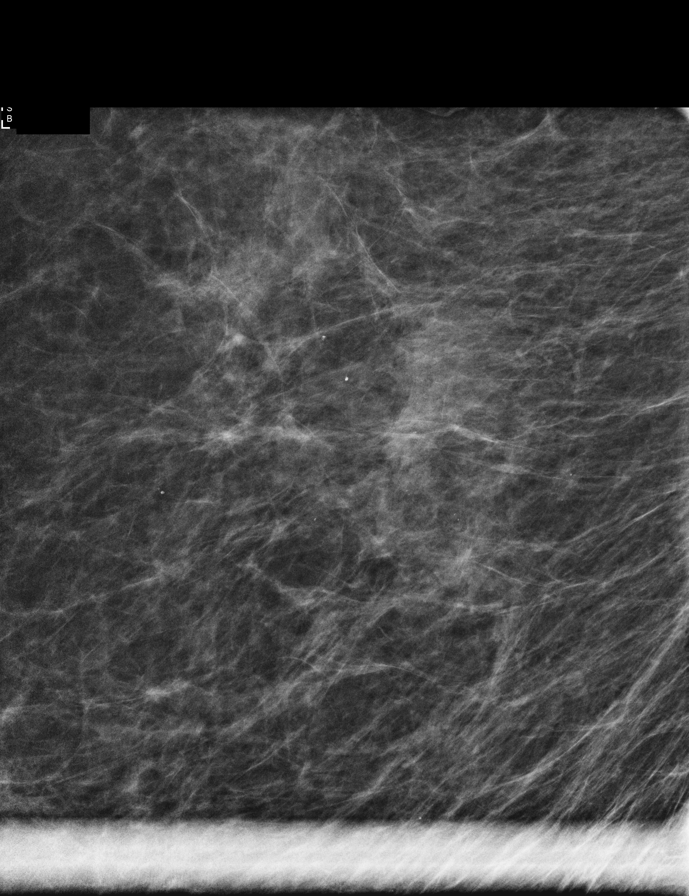

[R MLO synth-2D]
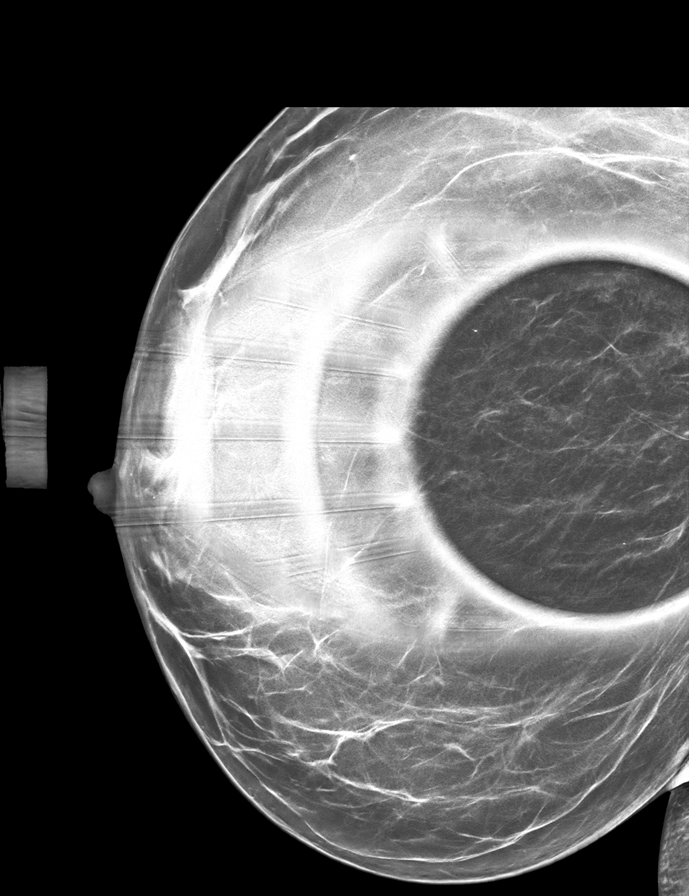

[R ML synth-2D (1 of 2)]
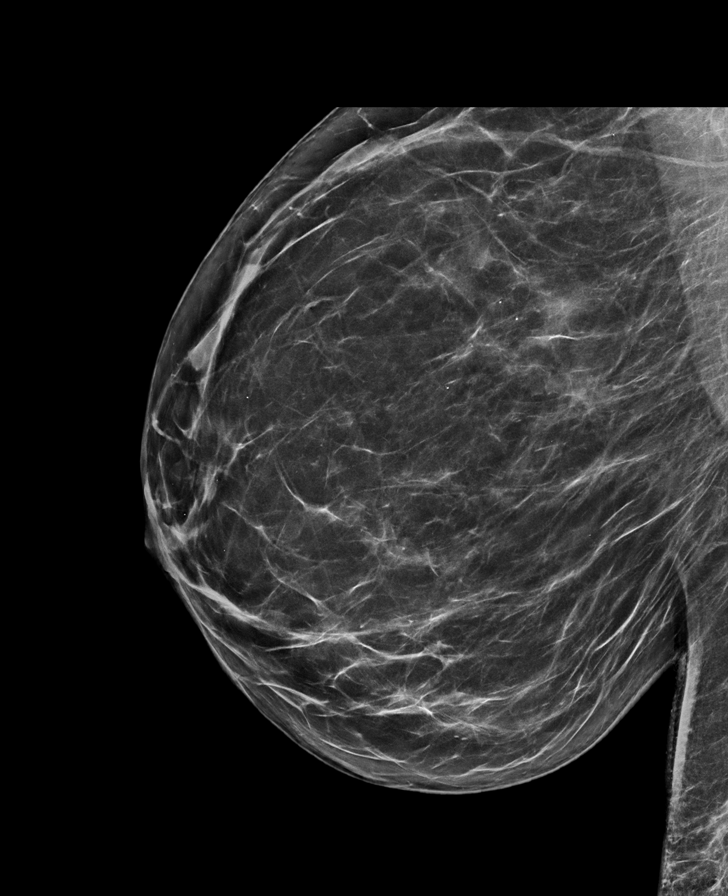

[R ML synth-2D (2 of 2)]
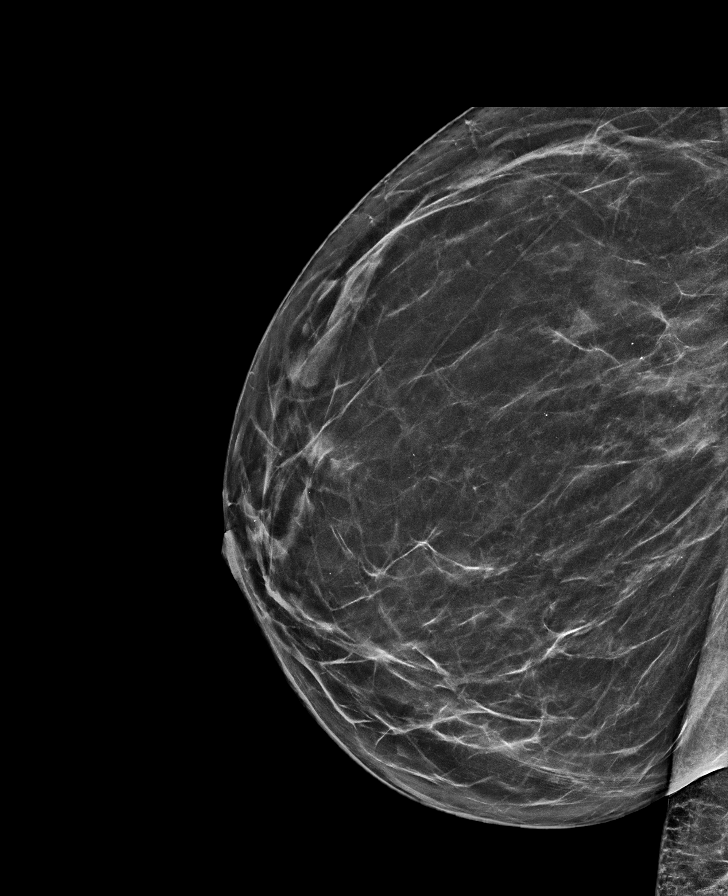

[R ML tomo (1 of 2) · tomo slice 38/75.0]
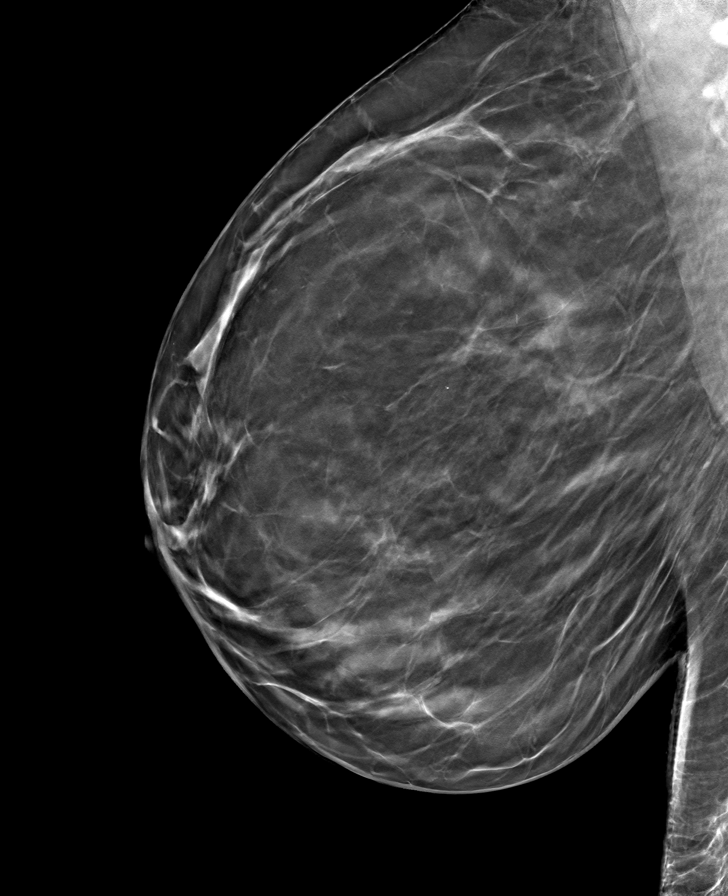

[R ML tomo (2 of 2) · tomo slice 36/71.0]
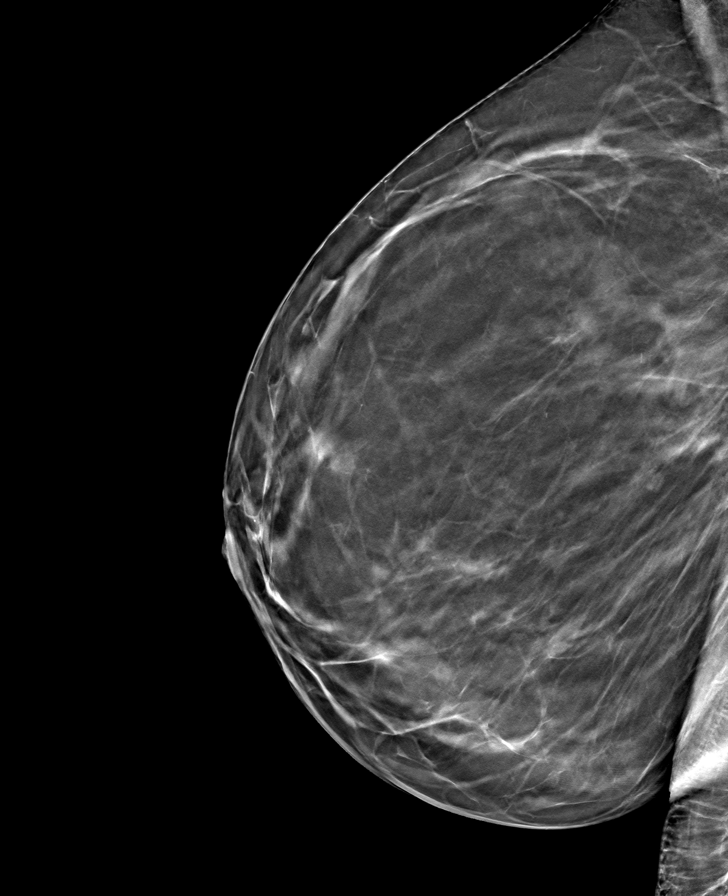

[R MLO tomo · tomo slice 31/61.0]
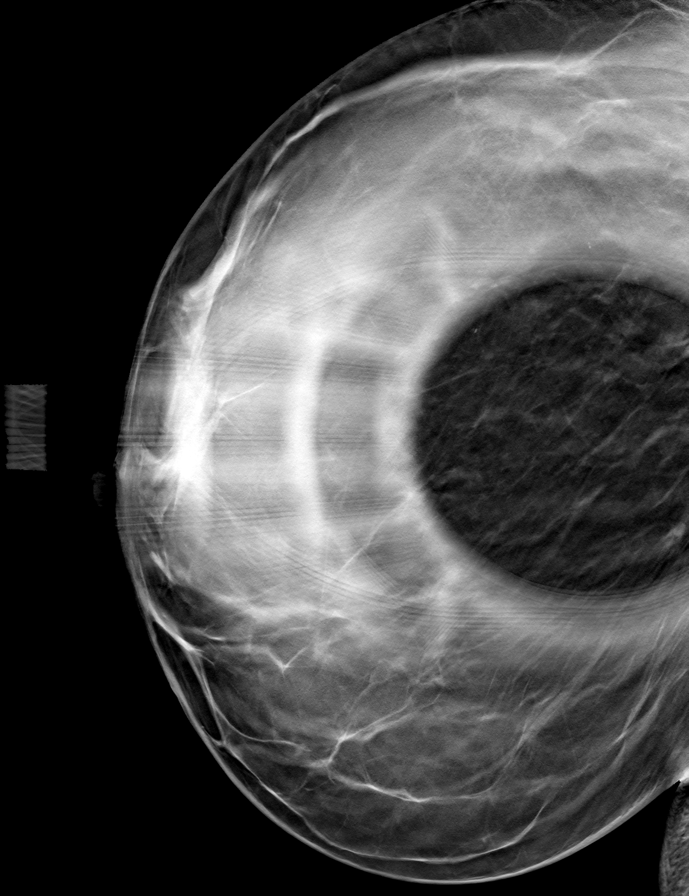

[8 of 20 positions shown; findings below may reference images not displayed]

ACR Breast Density Category b: There are scattered areas of
fibroglandular density.
FINDINGS: Previously described, possible asymmetry in the deep central right
breast on the MLO projection resolves into well dispersed
fibroglandular tissue on additional views. There are no associated
grouped calcifications on the magnification views.
IMPRESSION: No mammographic evidence of malignancy.

RECOMMENDATION:
Screening mammogram in one year.(Code:QE-A-56C)

I have discussed the findings and recommendations with the patient.
If applicable, a reminder letter will be sent to the patient
regarding the next appointment.

BI-RADS CATEGORY  1: Negative.

## 2023-08-07 ENCOUNTER — Other Ambulatory Visit: Payer: Self-pay

## 2023-08-07 ENCOUNTER — Ambulatory Visit
Admission: RE | Admit: 2023-08-07 | Discharge: 2023-08-07 | Disposition: A | Discharge status: Home / Self Care | Source: Ambulatory Visit | Attending: Emergency Medicine | Admitting: Emergency Medicine

## 2023-08-07 VITALS — BP 141/84 | HR 104 | Temp 98.5°F | Resp 18

## 2023-08-07 DIAGNOSIS — R051 Acute cough: Secondary | ICD-10-CM

## 2023-08-07 DIAGNOSIS — J22 Unspecified acute lower respiratory infection: Secondary | ICD-10-CM

## 2023-08-07 MED ORDER — DOXYCYCLINE HYCLATE 100 MG PO CAPS: 100.0000 mg | ORAL_CAPSULE | Freq: Two times a day (BID) | ORAL | 0 refills | Status: AC

## 2023-08-07 MED ORDER — PROMETHAZINE-DM 6.25-15 MG/5ML PO SYRP: 5.0000 mL | ORAL_SOLUTION | Freq: Two times a day (BID) | ORAL | 0 refills | Status: AC | PRN

## 2023-08-07 NOTE — Discharge Instructions (Addendum)
 Rest,push fluids,  May try using over the counter throat lozenges, mucinex or sudafed as label directed, hot tea, honey for cough.   antibiotic may cause upset stomach, resistance, photosensitivity  Follow-up with PCP

## 2023-08-07 NOTE — ED Triage Notes (Signed)
 Cough is making me vomit and I'm feeling wheezy. - Entered by patient  States she had a cold over a week ago. Still has cough which is keeping her up and sometimes she coughs so hard she vomits

## 2023-08-07 NOTE — ED Provider Notes (Signed)
 [facility]    CSN: [id number] Arrival date & time: 08/07/23  0847      History   Chief Complaint Chief Complaint  Patient presents with   Cough    HPI Aimee Gray is a 53 y.o. female.   53 year old female, Aimee Gray, presents to urgent care for evaluation of cough and cold symptoms that she has had over a week.  Patient states that her cough is so bad at nighttime that makes her vomit.  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  States she has been taken over-the-counter meds for symptom management as well as allergy medicine without relief. Pt requesting work note  PMH: Allergies, anxiety, depression, migraines, PCOS, GERD  The history is provided by the patient. No language interpreter was used.    Past Medical History:  Diagnosis Date   Allergy    Anxiety    Back pain    Depression    GERD (gastroesophageal reflux disease)    Infertility, female    Lactose intolerance    Migraines    Palpitations    PCOS (polycystic ovarian syndrome)    PONV (postoperative nausea and vomiting)     Patient Active Problem List   Diagnosis Date Noted   ARI (acute respiratory infection) 08/07/2023   Acute cough 08/07/2023   Gastroesophageal reflux disease 07/19/2023   Back pain 07/16/2021   Neck pain 07/16/2021   Symptomatic mammary hypertrophy 07/16/2021   Vitamin D deficiency 03/31/2020   Insulin resistance 03/31/2020   Class 1 obesity with serious comorbidity and body mass index (BMI) of 30.0 to 30.9 in adult 01/22/2020   Insomnia 01/07/2019   Acute bronchitis 08/10/2016   Migraine with aura and with status migrainosus, not intractable 08/10/2016   Anxiety and depression 10/14/2014   Generalized anxiety disorder 10/14/2014    Past Surgical History:  Procedure Laterality Date   BREAST REDUCTION SURGERY Bilateral 01/05/2022   Procedure: MAMMARY REDUCTION  (BREAST);  Surgeon: Peggye Form, DO;  Location: [facility];   Service: Plastics;  Laterality: Bilateral;   CESAREAN SECTION      OB History     Gravida  3   Para      Term      Preterm      AB      Living         SAB      IAB      Ectopic      Multiple      Live Births               Home Medications    Prior to Admission medications   Medication Sig Start Date End Date Taking? Authorizing Provider  amphetamine-dextroamphetamine (ADDERALL XR) 15 MG 24 hr capsule Take 15 mg by mouth every morning.   Yes [provider]  buPROPion (WELLBUTRIN SR) 150 MG 12 hr tablet Take 1 tablet (150 mg total) by mouth 2 (two) times daily. 06/22/23  Yes Langston Reusing, MD  cloNIDine (CATAPRES) 0.1 MG tablet Take 0.1 mg by mouth at bedtime. 06/16/23  Yes [provider]  doxycycline (VIBRAMYCIN) 100 MG capsule Take 1 capsule (100 mg total) by mouth 2 (two) times daily for 7 days. 08/07/23 08/14/23 Yes Haroun Cotham, Para March, NP  Galcanezumab-gnlm (EMGALITY) 120 MG/ML SOAJ Inject 120 mg into the skin every 30 (thirty) days. 12/07/22  Yes Windell Norfolk, MD  levonorgestrel (MIRENA) 20 MCG/24HR IUD 1 each by Intrauterine route once.  Yes [provider]  ondansetron (ZOFRAN) 4 MG tablet Take 1 tablet (4 mg total) by mouth every 8 (eight) hours as needed for up to 140 doses for nausea or vomiting. 12/07/22  Yes Camara, Amadou, MD  pantoprazole (PROTONIX) 40 MG tablet Take 1 tablet (40 mg total) by mouth daily. 07/19/23  Yes Philip Aspen, Limmie Patricia, MD  promethazine-dextromethorphan (PROMETHAZINE-DM) 6.25-15 MG/5ML syrup Take 5 mLs by mouth every 12 (twelve) hours as needed for cough. 08/07/23  Yes Harlem Thresher, Para March, NP  rizatriptan ([name]-MLT) 10 MG disintegrating tablet DISSOLVE 1 TAB BY MOUTH AS NEEDED FOR MIGRAINE. MAY REPEAT IN 2 HOURS IF NEEDED. 12/07/22  Yes Camara, Amadou, MD  Vitamin D, Ergocalciferol, (DRISDOL) 1.25 MG (50000 UNIT) CAPS capsule Take 1 capsule (50,000 Units total) by mouth every 7 (seven) days.  06/22/23  Yes Langston Reusing, MD  vortioxetine HBr (TRINTELLIX) 20 MG TABS tablet Take 1 tablet (20 mg total) by mouth daily. 06/22/23  Yes Langston Reusing, MD    Family History Family History  Problem Relation Age of Onset   Heart disease Mother    Hypertension Mother    Thyroid disease Mother    Depression Mother    Anxiety disorder Mother    Sleep apnea Mother    Obesity Mother    Diabetes Father    Heart disease Father    Hyperlipidemia Father    Hypertension Father    Cancer Maternal Grandmother    Diabetes Maternal Grandfather    Stroke Maternal Grandfather    Heart disease Paternal Grandmother    Hypertension Paternal Grandmother    Heart disease Paternal Grandfather    Breast cancer Neg Hx     Social History Social History   Tobacco Use   Smoking status: Former   Smokeless tobacco: Former    Quit date: 2000   Tobacco comments:    quit 15 years ago  Advertising account planner   Vaping status: Never Used  Substance Use Topics   Alcohol use: No   Drug use: No     Allergies   Hydrocodone   Review of Systems Review of Systems  Constitutional:  Negative for diaphoresis and fever.  HENT:  Positive for congestion and sinus pressure.   Respiratory:  Positive for cough. Negative for shortness of breath and wheezing.   All other systems reviewed and are negative.    Physical Exam Triage Vital Signs ED Triage Vitals  Encounter Vitals Group     BP 08/07/23 0916 (!) 141/84     Systolic BP Percentile --      Diastolic BP Percentile --      Pulse Rate 08/07/23 0916 (!) 104     Resp 08/07/23 0916 18     Temp 08/07/23 0916 98.5 F (36.9 C)     Temp Source 08/07/23 0916 Oral     SpO2 08/07/23 0916 97 %     Weight --      Height --      Head Circumference --      Peak Flow --      Pain Score 08/07/23 0914 4     Pain Loc --      Pain Education --      Exclude from Growth Chart --    No data found.  Updated Vital Signs BP (!) 141/84 (BP Location: Right  Arm)   Pulse (!) 104   Temp 98.5 F (36.9 C) (Oral)   Resp 18   SpO2 97%  Visual Acuity Right Eye Distance:   Left Eye Distance:   Bilateral Distance:    Right Eye Near:   Left Eye Near:    Bilateral Near:     Physical Exam Vitals and nursing note reviewed.  Constitutional:      General: She is not in acute distress.    Appearance: She is well-developed and well-groomed.  HENT:     Head: Normocephalic.     Right Ear: Tympanic membrane is retracted.     Left Ear: Tympanic membrane is retracted.     Nose: Congestion present.     Mouth/Throat:     Lips: Pink.     Mouth: Mucous membranes are moist.     Pharynx: Oropharynx is clear.  Eyes:     General: Lids are normal.     Conjunctiva/sclera: Conjunctivae normal.     Pupils: Pupils are equal, round, and reactive to light.  Neck:     Trachea: No tracheal deviation.  Cardiovascular:     Rate and Rhythm: Regular rhythm. Tachycardia present.     Heart sounds: Normal heart sounds. No murmur heard. Pulmonary:     Effort: Pulmonary effort is normal.     Breath sounds: Normal breath sounds and air entry.  Abdominal:     General: Bowel sounds are normal.     Palpations: Abdomen is soft.     Tenderness: There is no abdominal tenderness.  Musculoskeletal:        General: Normal range of motion.     Cervical back: Normal range of motion.  Lymphadenopathy:     Cervical: No cervical adenopathy.  Skin:    General: Skin is warm and dry.     Findings: No rash.  Neurological:     General: No focal deficit present.     Mental Status: She is alert and oriented to person, place, and time.     GCS: GCS eye subscore is 4. GCS verbal subscore is 5. GCS motor subscore is 6.  Psychiatric:        Attention and Perception: Attention normal.        Mood and Affect: Mood normal.        Speech: Speech normal.        Behavior: Behavior normal. Behavior is cooperative.      UC Treatments / Results  Labs (all labs ordered are listed,  but only abnormal results are displayed) Labs Reviewed - No data to display  EKG   Radiology No results found.  Procedures Procedures (including critical care time)  Medications Ordered in UC Medications - No data to display  Initial Impression / Assessment and Plan / UC Course  I have reviewed the triage vital signs and the nursing notes.  Pertinent labs & imaging results that were available during my care of the patient were reviewed by me and considered in my medical decision making (see chart for details).    Discussed exam findings and plan of care with patient, will treat acute respiratory infection with doxycycline that will cover sinuses as well as chest, cough medicine given.  Work  note given,  patient verbalized understanding to this provider.  Ddx: Acute respiratory infection, cough, allergies Final Clinical Impressions(s) / UC Diagnoses   Final diagnoses:  ARI (acute respiratory infection)  Acute cough     Discharge Instructions      Rest,push fluids,  May try using over the counter throat lozenges, mucinex or sudafed as label directed, hot tea, honey for cough.  antibiotic may cause upset stomach, resistance, photosensitivity  Follow-up with PCP     ED Prescriptions     Medication Sig Dispense Auth. Provider   doxycycline (VIBRAMYCIN) 100 MG capsule Take 1 capsule (100 mg total) by mouth 2 (two) times daily for 7 days. 14 capsule Kham Zuckerman, NP   promethazine-dextromethorphan (PROMETHAZINE-DM) 6.25-15 MG/5ML syrup Take 5 mLs by mouth every 12 (twelve) hours as needed for cough. 118 mL Erionna Strum, Para March, NP      PDMP not reviewed this encounter.   Clancy Gourd, NP 08/07/23 [id number]

## 2023-09-14 ENCOUNTER — Encounter: Payer: Self-pay | Admitting: Internal Medicine

## 2023-09-14 DIAGNOSIS — K219 Gastro-esophageal reflux disease without esophagitis: Secondary | ICD-10-CM

## 2023-09-18 MED ORDER — PANTOPRAZOLE SODIUM 40 MG PO TBEC: 40.0000 mg | DELAYED_RELEASE_TABLET | Freq: Two times a day (BID) | ORAL | 1 refills | Status: DC

## 2023-11-20 ENCOUNTER — Ambulatory Visit (INDEPENDENT_AMBULATORY_CARE_PROVIDER_SITE_OTHER): Payer: 59 | Admitting: Internal Medicine

## 2023-11-20 ENCOUNTER — Encounter: Payer: Self-pay | Admitting: Internal Medicine

## 2023-11-20 VITALS — BP 110/78 | HR 75 | Temp 97.8°F | Ht 60.5 in | Wt 155.2 lb

## 2023-11-20 DIAGNOSIS — Z6830 Body mass index (BMI) 30.0-30.9, adult: Secondary | ICD-10-CM | POA: Diagnosis not present

## 2023-11-20 DIAGNOSIS — E7849 Other hyperlipidemia: Secondary | ICD-10-CM

## 2023-11-20 DIAGNOSIS — E559 Vitamin D deficiency, unspecified: Secondary | ICD-10-CM | POA: Diagnosis not present

## 2023-11-20 DIAGNOSIS — Z1159 Encounter for screening for other viral diseases: Secondary | ICD-10-CM

## 2023-11-20 DIAGNOSIS — Z114 Encounter for screening for human immunodeficiency virus [HIV]: Secondary | ICD-10-CM

## 2023-11-20 DIAGNOSIS — E88819 Insulin resistance, unspecified: Secondary | ICD-10-CM | POA: Diagnosis not present

## 2023-11-20 DIAGNOSIS — Z0000 Encounter for general adult medical examination without abnormal findings: Secondary | ICD-10-CM

## 2023-11-20 DIAGNOSIS — Z23 Encounter for immunization: Secondary | ICD-10-CM | POA: Diagnosis not present

## 2023-11-20 DIAGNOSIS — E66811 Obesity, class 1: Secondary | ICD-10-CM

## 2023-11-20 LAB — HEMOGLOBIN A1C: Hgb A1c MFr Bld: 5.4 % (ref 4.6–6.5)

## 2023-11-20 LAB — VITAMIN B12: Vitamin B-12: 318 pg/mL (ref 211–911)

## 2023-11-20 LAB — LIPID PANEL
Cholesterol: 200 mg/dL (ref 0–200)
HDL: 43.7 mg/dL (ref >39.00)
LDL Cholesterol: 139 mg/dL — ABNORMAL HIGH (ref 0–99)
NonHDL: 156.07
Total CHOL/HDL Ratio: 5
Triglycerides: 84 mg/dL (ref 0.0–149.0)
VLDL: 16.8 mg/dL (ref 0.0–40.0)

## 2023-11-20 LAB — CBC WITH DIFFERENTIAL/PLATELET
Basophils Absolute: 0.1 10*3/uL (ref 0.0–0.1)
Basophils Relative: 0.8 % (ref 0.0–3.0)
Eosinophils Absolute: 0.1 10*3/uL (ref 0.0–0.7)
Eosinophils Relative: 1.1 % (ref 0.0–5.0)
HCT: 40.8 % (ref 36.0–46.0)
Hemoglobin: 13.7 g/dL (ref 12.0–15.0)
Lymphocytes Relative: 22 % (ref 12.0–46.0)
Lymphs Abs: 1.7 10*3/uL (ref 0.7–4.0)
MCHC: 33.6 g/dL (ref 30.0–36.0)
MCV: 92.9 fl (ref 78.0–100.0)
Monocytes Absolute: 0.5 10*3/uL (ref 0.1–1.0)
Monocytes Relative: 6.2 % (ref 3.0–12.0)
Neutro Abs: 5.4 10*3/uL (ref 1.4–7.7)
Neutrophils Relative %: 69.9 % (ref 43.0–77.0)
Platelets: 317 10*3/uL (ref 150.0–400.0)
RBC: 4.39 Mil/uL (ref 3.87–5.11)
RDW: 13.1 % (ref 11.5–15.5)
WBC: 7.7 10*3/uL (ref 4.0–10.5)

## 2023-11-20 LAB — COMPREHENSIVE METABOLIC PANEL WITH GFR
ALT: 17 U/L (ref 0–35)
AST: 13 U/L (ref 0–37)
Albumin: 4.3 g/dL (ref 3.5–5.2)
Alkaline Phosphatase: 43 U/L (ref 39–117)
BUN: 10 mg/dL (ref 6–23)
CO2: 28 meq/L (ref 19–32)
Calcium: 8.7 mg/dL (ref 8.4–10.5)
Chloride: 103 meq/L (ref 96–112)
Creatinine, Ser: 0.86 mg/dL (ref 0.40–1.20)
GFR: 78.45 mL/min (ref >60.00)
Glucose, Bld: 81 mg/dL (ref 70–99)
Potassium: 3.6 meq/L (ref 3.5–5.1)
Sodium: 139 meq/L (ref 135–145)
Total Bilirubin: 0.3 mg/dL (ref 0.2–1.2)
Total Protein: 6.4 g/dL (ref 6.0–8.3)

## 2023-11-20 LAB — TSH: TSH: 1.73 u[IU]/mL (ref 0.35–5.50)

## 2023-11-20 LAB — VITAMIN D 25 HYDROXY (VIT D DEFICIENCY, FRACTURES): VITD: 32.54 ng/mL (ref 30.00–100.00)

## 2023-11-20 MED ORDER — TIRZEPATIDE-WEIGHT MANAGEMENT 2.5 MG/0.5ML ~~LOC~~ SOLN: 2.5000 mg | SUBCUTANEOUS | 0 refills | Status: DC

## 2023-11-20 NOTE — Progress Notes (Signed)
 Established Patient Office Visit     CC/Reason for Visit: Annual preventive exam and discuss weight loss  HPI: ENZA SHONE is a 53 y.o. female who is coming in today for the above mentioned reasons. Past Medical History is significant for: Anxiety and depression, ADHD, migraine headaches.  She is doing well.  Cancer screening is up-to-date.  She is due for her second shingles vaccine.  She has gained about 12 pounds since last visit.  She would like to discuss restarting Zepbound  that she had good response to in the past.  She has a history of obesity, insulin  resistance.   Past Medical/Surgical History: Past Medical History:  Diagnosis Date   Allergy    Anxiety    Back pain    Depression    GERD (gastroesophageal reflux disease)    Infertility, female    Lactose intolerance    Migraines    Palpitations    PCOS (polycystic ovarian syndrome)    PONV (postoperative nausea and vomiting)     Past Surgical History:  Procedure Laterality Date   BREAST REDUCTION SURGERY Bilateral 01/05/2022   Procedure: MAMMARY REDUCTION  (BREAST);  Surgeon: Lowery Estefana RAMAN, DO;  Location: [facility];  Service: Plastics;  Laterality: Bilateral;   CESAREAN SECTION      Social History:  reports that she has quit smoking. She quit smokeless tobacco use about 25 years ago. She reports that she does not drink alcohol and does not use drugs.  Allergies: Allergies  Allergen Reactions   Hydrocodone  Nausea And Vomiting    Family History:  Family History  Problem Relation Age of Onset   Heart disease Mother    Hypertension Mother    Thyroid disease Mother    Depression Mother    Anxiety disorder Mother    Sleep apnea Mother    Obesity Mother    Diabetes Father    Heart disease Father    Hyperlipidemia Father    Hypertension Father    Cancer Maternal Grandmother    Diabetes Maternal Grandfather    Stroke Maternal Grandfather    Heart disease Paternal  Grandmother    Hypertension Paternal Grandmother    Heart disease Paternal Grandfather    Breast cancer Neg Hx      Current Outpatient Medications:    amphetamine-dextroamphetamine (ADDERALL XR) 15 MG 24 hr capsule, Take 15 mg by mouth every morning., Disp: , Rfl:    buPROPion  (WELLBUTRIN  SR) 150 MG 12 hr tablet, Take 1 tablet (150 mg total) by mouth 2 (two) times daily., Disp: 180 tablet, Rfl: 0   cloNIDine (CATAPRES) 0.1 MG tablet, Take 0.1 mg by mouth at bedtime., Disp: , Rfl:    Galcanezumab -gnlm (EMGALITY ) 120 MG/ML SOAJ, Inject 120 mg into the skin every 30 (thirty) days., Disp: 1.12 mL, Rfl: 11   levonorgestrel (MIRENA) 20 MCG/24HR IUD, 1 each by Intrauterine route once., Disp: , Rfl:    ondansetron  (ZOFRAN ) 4 MG tablet, Take 1 tablet (4 mg total) by mouth every 8 (eight) hours as needed for up to 140 doses for nausea or vomiting., Disp: 20 tablet, Rfl: 6   pantoprazole  (PROTONIX ) 40 MG tablet, Take 1 tablet (40 mg total) by mouth 2 (two) times daily., Disp: 180 tablet, Rfl: 1   rizatriptan  ([name] -MLT) 10 MG disintegrating tablet, DISSOLVE 1 TAB BY MOUTH AS NEEDED FOR MIGRAINE. MAY REPEAT IN 2 HOURS IF NEEDED., Disp: 10 tablet, Rfl: 11   tirzepatide  (ZEPBOUND ) 2.5 MG/0.5ML injection vial, Inject  2.5 mg into the skin once a week., Disp: 2 mL, Rfl: 0   Vitamin D , Ergocalciferol , (DRISDOL ) 1.25 MG (50000 UNIT) CAPS capsule, Take 1 capsule (50,000 Units total) by mouth every 7 (seven) days., Disp: 12 capsule, Rfl: 0   vortioxetine  HBr (TRINTELLIX ) 20 MG TABS tablet, Take 1 tablet (20 mg total) by mouth daily., Disp: 90 tablet, Rfl: 1   promethazine -dextromethorphan (PROMETHAZINE -DM) 6.25-15 MG/5ML syrup, Take 5 mLs by mouth every 12 (twelve) hours as needed for cough., Disp: 118 mL, Rfl: 0  Review of Systems:  Negative unless indicated in HPI.   Physical Exam: Vitals:   11/20/23 0754  BP: 110/78  Pulse: 75  Temp: 97.8 F (36.6 C)  TempSrc: Oral  SpO2: 98%  Weight: 155 lb 3.2 oz  (70.4 kg)  Height: 5' 0.5 (1.537 m)    Body mass index is 29.81 kg/m.   Physical Exam Vitals reviewed.  Constitutional:      General: She is not in acute distress.    Appearance: Normal appearance. She is obese. She is not ill-appearing, toxic-appearing or diaphoretic.  HENT:     Head: Normocephalic.     Right Ear: Tympanic membrane, ear canal and external ear normal. There is no impacted cerumen.     Left Ear: Tympanic membrane, ear canal and external ear normal. There is no impacted cerumen.     Nose: Nose normal.     Mouth/Throat:     Mouth: Mucous membranes are moist.     Pharynx: Oropharynx is clear. No oropharyngeal exudate or posterior oropharyngeal erythema.   Eyes:     General: No scleral icterus.       Right eye: No discharge.        Left eye: No discharge.     Conjunctiva/sclera: Conjunctivae normal.     Pupils: Pupils are equal, round, and reactive to light.   Neck:     Vascular: No carotid bruit.   Cardiovascular:     Rate and Rhythm: Normal rate and regular rhythm.     Pulses: Normal pulses.     Heart sounds: Normal heart sounds.  Pulmonary:     Effort: Pulmonary effort is normal. No respiratory distress.     Breath sounds: Normal breath sounds.  Abdominal:     General: Abdomen is flat. Bowel sounds are normal.     Palpations: Abdomen is soft.   Musculoskeletal:        General: Normal range of motion.     Cervical back: Normal range of motion.   Skin:    General: Skin is warm and dry.   Neurological:     General: No focal deficit present.     Mental Status: She is alert and oriented to person, place, and time. Mental status is at baseline.   Psychiatric:        Mood and Affect: Mood normal.        Behavior: Behavior normal.        Thought Content: Thought content normal.        Judgment: Judgment normal.     Flowsheet Row Office Visit from 07/19/2023 in [facility]  PHQ-9 Total Score 8     Impression and  Plan:  Vitamin D  deficiency -     VITAMIN D  25 Hydroxy (Vit-D Deficiency, Fractures); Future  Class 1 obesity with serious comorbidity and body mass index (BMI) of 30.0 to 30.9 in adult, unspecified obesity type -     TSH; Future -  Vitamin B12; Future -     Tirzepatide -Weight Management; Inject 2.5 mg into the skin once a week.  Dispense: 2 mL; Refill: 0  Insulin  resistance -     Hemoglobin A1c; Future -     Tirzepatide -Weight Management; Inject 2.5 mg into the skin once a week.  Dispense: 2 mL; Refill: 0  Other hyperlipidemia -     CBC with Differential/Platelet; Future -     Comprehensive metabolic panel with GFR; Future -     Lipid panel; Future  Encounter for hepatitis C screening test for low risk patient -     Hepatitis C antibody; Future  Encounter for screening for HIV -     HIV Antibody (routine testing w rflx); Future  -Recommend routine eye and dental care. -Healthy lifestyle discussed in detail. -Labs to be updated today. -Prostate cancer screening: N/A Health Maintenance  Topic Date Due   HIV Screening  Never done   Hepatitis C Screening  Never done   Hepatitis B Vaccine (1 of 3 - 19+ 3-dose series) Never done   COVID-19 Vaccine (3 - 2024-25 season) 01/22/2023   Zoster (Shingles) Vaccine (2 of 2) 09/13/2023   Flu Shot  12/22/2023   Mammogram  05/04/2025   Pap with HPV screening  01/11/2028   Colon Cancer Screening  02/22/2033   DTaP/Tdap/Td vaccine (2 - Td or Tdap) 07/18/2033   HPV Vaccine  Aged Out   Meningitis B Vaccine  Aged Out      - Second shingles vaccine today. - Cancer screening is up-to-date. - After discussion she would like to see if insurance will cover GLP-1 for weight loss.  Will send Zepbound .    Tully Theophilus Andrews, MD [facility]

## 2023-11-21 ENCOUNTER — Ambulatory Visit: Payer: Self-pay | Admitting: Internal Medicine

## 2023-11-21 ENCOUNTER — Other Ambulatory Visit (HOSPITAL_COMMUNITY): Payer: Self-pay

## 2023-11-21 LAB — HIV ANTIBODY (ROUTINE TESTING W REFLEX): HIV 1&2 Ab, 4th Generation: NONREACTIVE

## 2023-11-21 LAB — HEPATITIS C ANTIBODY: Hepatitis C Ab: NONREACTIVE

## 2023-11-21 NOTE — Telephone Encounter (Signed)
 Medication list updated.

## 2023-12-07 NOTE — Progress Notes (Signed)
 [facility]  PATIENT: Aimee Gray DOB: [birth year]  REFERRING CLINICIAN: No ref. provider found HISTORY FROM: Patient  REASON FOR VISIT: Migraines follow up    HISTORICAL  CHIEF COMPLAINT:  Chief Complaint  Patient presents with   Follow-up    Room 3 follow up for headaches , she has break though headaches because of the whether  -Migraine with aura and with status migrainosus, not intractable    HPI:  INTERVAL HISTORY 10/07/2023 JM: Patient returns for yearly follow-up.  She remains on Emgality  but has had some worsening of migraine headaches. Unsure if related to weather with frequent storms and heat as these are a known trigger for her. She has been experiencing about 5 migraines per month.  Use of rizatriptan  and Zofran  as needed with benefit. At times, she can have postdrome type symptoms for a day without experiencing a migraine headache but this usually occurs when she is late for injection, she can forget to request refill until due and then can take several days for pharmacy to obtain as it needs to be ordered. She questions pursuing MRI brain as she was not able to get this covered previously but has since changed insurance plans. She mentions her mother has been having recurrent strokes over the past few years.     History provided from Dr. Janean prior OV notes for reference purposes INTERVAL HISTORY 12/07/2022 Patient presents today for follow-up, she is alone.  Last visit was a year ago since then she has been doing well on Emgality .  [name]  was not approved by her insurance therefore we switched her to Emgality .  She reports 1-2 headaches per month for which she takes [name]  as needed which helped.  Currently she is satisfied with her frequency.  She does reports heat intolerance, waking up in the middle of the night with sweats but feels it might be related to menopause.  No other concerns, no other questions.   INTERVAL HISTORY 12/06/2021:   Patient presents today for follow-up, at last visit in January plan was to start Botox treatment but it was not approved.  Since then she has been started on [name]  and reports a good improvement in her headaches.  In the past she used to get 1-2 headache per week but currently with [name]  she has month without headaches.  She reports recently with the summer heat and working outside in her garden causes some additional headaches but the [name]  seems to help.  Patient is satisfied with her current headache frequency   INTERNAL HISTORY 06/07/2021:  Patient present today for follow-up, last visit was in October at that time plan was to start gabapentin  as preventive medication since since it has helped patient in the past and to also start Nurtec as abortive medication.  She reported her headache frequency is about the same, she will get 1-2 migraine per week, Nurtec is not effective now she went back to [name] .  She was not able to complete MRI brain due to insurance issues. Again for preventive medication patient has tried amitriptyline , topiramate , propanolol and gabapentin .   CONSULT VISIT 03/01/2021: This is a 53 year old woman with past medical history of anxiety depression, insomnia and prediabetes who is presenting for migraines.  Patient stated she has a been diagnosed with migraines since her childhood.  For her migraines, they typically started behind the left eye, sometimes it can switch to the right eye.  Her migraine has been fairly controlled with medication, she report when she was  pregnant did not have any episodes but after giving birth the migraine restarted.  Patient reported lately her migraines have increased in frequency, she is getting about 10 migraine days per month.  Her only medication is [name]  and is currently ineffective.  She has tried multiple preventive medications including amitriptyline , topiramate , gabapentin , and propanolol.  For abortive medication she has tried  Tylenol, ibuprofen  and [name] .  Migraines are associated with nausea vomiting, she reported she has to stay in a dark room.  She has a strong family history of migraine including her sister and her 2 nieces.   Headache History and Characteristics: Onset: Since child  Location: Behind left eye but can switch between left and right eye  Quality: Excruciating pain, sometimes can be throbbing  Intensity: 6-8/10.  Duration: 12-24 hrs Migrainous Features: Photophobia, phonophobia, nausea, vomiting.  Aura: No  History of brain injury or tumor: No  Family history: Sister and niece with Migraines  Motion sickness: no Cardiac history: no  OTC: tylenol, Ibuprofen   Caffeine: Yes  Sleep: not great, will get up to 7 hours some night, often time cannot sleep without a sleep aid  Mood/ Stress: Teacher, 2nd grade   Prior prophylaxis: Propranolol : Yes Verapamil:No TCA: Yes Topamax : Yes, brain fog Depakote: No Effexor: No Cymbalta: No Neurontin :Yes   Prior abortives: Triptan: [name]  Anti-emetic: No Steroids: No Ergotamine suppository: No   OTHER MEDICAL CONDITIONS: Depression, Insomnia, Anxiety, Pre-diabetes    REVIEW OF SYSTEMS: Full 14 system review of systems performed and negative with exception of: as noted in the HPI  ALLERGIES: Allergies  Allergen Reactions   Hydrocodone  Nausea And Vomiting    HOME MEDICATIONS: Outpatient Medications Prior to Visit  Medication Sig Dispense Refill   buPROPion  (WELLBUTRIN  SR) 150 MG 12 hr tablet Take 1 tablet (150 mg total) by mouth 2 (two) times daily. 180 tablet 0   cloNIDine (CATAPRES) 0.1 MG tablet Take 0.1 mg by mouth at bedtime.     levonorgestrel (MIRENA) 20 MCG/24HR IUD 1 each by Intrauterine route once.     pantoprazole  (PROTONIX ) 40 MG tablet Take 1 tablet (40 mg total) by mouth 2 (two) times daily. 180 tablet 1   Vitamin D , Ergocalciferol , (DRISDOL ) 1.25 MG (50000 UNIT) CAPS capsule Take 1 capsule (50,000 Units total) by mouth  every 7 (seven) days. 12 capsule 0   vortioxetine  HBr (TRINTELLIX ) 20 MG TABS tablet Take 1 tablet (20 mg total) by mouth daily. 90 tablet 1   Galcanezumab -gnlm (EMGALITY ) 120 MG/ML SOAJ Inject 120 mg into the skin every 30 (thirty) days. 1.12 mL 11   ondansetron  (ZOFRAN ) 4 MG tablet Take 1 tablet (4 mg total) by mouth every 8 (eight) hours as needed for up to 140 doses for nausea or vomiting. 20 tablet 6   rizatriptan  ([name] -MLT) 10 MG disintegrating tablet DISSOLVE 1 TAB BY MOUTH AS NEEDED FOR MIGRAINE. MAY REPEAT IN 2 HOURS IF NEEDED. 10 tablet 11   amphetamine-dextroamphetamine (ADDERALL XR) 15 MG 24 hr capsule Take 15 mg by mouth every morning. (Patient not taking: Reported on 12/11/2023)     promethazine -dextromethorphan (PROMETHAZINE -DM) 6.25-15 MG/5ML syrup Take 5 mLs by mouth every 12 (twelve) hours as needed for cough. (Patient not taking: Reported on 12/11/2023) 118 mL 0   No facility-administered medications prior to visit.    PAST MEDICAL HISTORY: Past Medical History:  Diagnosis Date   Allergy    Anxiety    Back pain    Depression    GERD (gastroesophageal reflux disease)  Infertility, female    Lactose intolerance    Migraines    Palpitations    PCOS (polycystic ovarian syndrome)    PONV (postoperative nausea and vomiting)     PAST SURGICAL HISTORY: Past Surgical History:  Procedure Laterality Date   BREAST REDUCTION SURGERY Bilateral 01/05/2022   Procedure: MAMMARY REDUCTION  (BREAST);  Surgeon: Lowery Estefana RAMAN, DO;  Location: [facility];  Service: Plastics;  Laterality: Bilateral;   CESAREAN SECTION      FAMILY HISTORY: Family History  Problem Relation Age of Onset   Stroke Mother    Heart disease Mother    Hypertension Mother    Thyroid disease Mother    Depression Mother    Anxiety disorder Mother    Sleep apnea Mother    Obesity Mother    Diabetes Father    Heart disease Father    Hyperlipidemia Father    Hypertension Father     Cancer Maternal Grandmother    Diabetes Maternal Grandfather    Stroke Maternal Grandfather    Heart disease Paternal Grandmother    Hypertension Paternal Grandmother    Heart disease Paternal Grandfather    Breast cancer Neg Hx     SOCIAL HISTORY: Social History   Socioeconomic History   Marital status: Married    Spouse name: Not on file   Number of children: Not on file   Years of education: Not on file   Highest education level: Bachelor's degree (e.g., BA, AB, BS)  Occupational History   Not on file  Tobacco Use   Smoking status: Former   Smokeless tobacco: Former    Quit date: 2000   Tobacco comments:    quit 15 years ago  Vaping Use   Vaping status: Never Used  Substance and Sexual Activity   Alcohol use: No   Drug use: No   Sexual activity: Never  Other Topics Concern   Not on file  Social History Narrative   Right handed   Lives with husband and 2 kids   Caffeine-1-2 cups daily   Social Drivers of Health   Financial Resource Strain: Medium Risk (11/19/2023)   Overall Financial Resource Strain (CARDIA)    Difficulty of Paying Living Expenses: Somewhat hard  Food Insecurity: No Food Insecurity (11/19/2023)   Hunger Vital Sign    Worried About Running Out of Food in the Last Year: Never true    Ran Out of Food in the Last Year: Never true  Transportation Needs: No Transportation Needs (11/19/2023)   PRAPARE - Administrator, Civil Service (Medical): No    Lack of Transportation (Non-Medical): No  Physical Activity: Inactive (11/19/2023)   Exercise Vital Sign    Days of Exercise per Week: 0 days    Minutes of Exercise per Session: Not on file  Stress: Stress Concern Present (11/19/2023)   [employer] of Occupational Health - Occupational Stress Questionnaire    Feeling of Stress: Rather much  Social Connections: Moderately Integrated (11/19/2023)   Social Connection and Isolation Panel    Frequency of Communication with Friends and Family:  Three times a week    Frequency of Social Gatherings with Friends and Family: Once a week    Attends Religious Services: Never    Database administrator or Organizations: Yes    Attends Banker Meetings: Never    Marital Status: Married  Catering manager Violence: Not on file     PHYSICAL EXAM  GENERAL EXAM/CONSTITUTIONAL: Vitals:  Vitals:   12/11/23 0842  BP: (!) 150/88  Pulse: 74  Weight: 155 lb 6.4 oz (70.5 kg)  Height: 5' 0.5 (1.537 m)    Body mass index is 29.85 kg/m. Wt Readings from Last 3 Encounters:  12/11/23 155 lb 6.4 oz (70.5 kg)  11/20/23 155 lb 3.2 oz (70.4 kg)  07/19/23 143 lb 3.2 oz (65 kg)   Patient is in no distress; well developed, nourished and groomed; neck is supple  MUSCULOSKELETAL: Gait, strength, tone, movements noted in Neurologic exam below  NEUROLOGIC: MENTAL STATUS:  awake, alert, oriented to person, place and time recent and remote memory intact normal attention and concentration language fluent, comprehension intact, naming intact fund of knowledge appropriate  CRANIAL NERVE:  2nd, 3rd, 4th, 6th - pupils equal and reactive to light, visual fields full to confrontation, extraocular muscles intact, no nystagmus 5th - facial sensation symmetric 7th - facial strength symmetric 8th - hearing intact 9th - palate elevates symmetrically, uvula midline 11th - shoulder shrug symmetric 12th - tongue protrusion midline  MOTOR:  normal bulk and tone, full strength in the BUE, BLE  SENSORY:  normal and symmetric to light touch  COORDINATION:  finger-nose-finger, fine finger movements normal  GAIT/STATION:  normal   DIAGNOSTIC DATA (LABS, IMAGING, TESTING) - I reviewed patient records, labs, notes, testing and imaging myself where available.  Lab Results  Component Value Date   WBC 7.7 11/20/2023   HGB 13.7 11/20/2023   HCT 40.8 11/20/2023   MCV 92.9 11/20/2023   PLT 317.0 11/20/2023      Component Value  Date/Time   NA 139 11/20/2023 0820   NA 141 11/14/2022 0901   K 3.6 11/20/2023 0820   CL 103 11/20/2023 0820   CO2 28 11/20/2023 0820   GLUCOSE 81 11/20/2023 0820   BUN 10 11/20/2023 0820   BUN 11 11/14/2022 0901   CREATININE 0.86 11/20/2023 0820   CALCIUM 8.7 11/20/2023 0820   PROT 6.4 11/20/2023 0820   PROT 5.9 (L) 11/14/2022 0901   ALBUMIN 4.3 11/20/2023 0820   ALBUMIN 4.1 11/14/2022 0901   AST 13 11/20/2023 0820   ALT 17 11/20/2023 0820   ALKPHOS 43 11/20/2023 0820   BILITOT 0.3 11/20/2023 0820   BILITOT 0.4 11/14/2022 0901   GFRNONAA 102 10/28/2019 1146   GFRAA 118 10/28/2019 1146   Lab Results  Component Value Date   CHOL 200 11/20/2023   HDL 43.70 11/20/2023   LDLCALC 139 (H) 11/20/2023   TRIG 84.0 11/20/2023   CHOLHDL 5 11/20/2023   Lab Results  Component Value Date   HGBA1C 5.4 11/20/2023   Lab Results  Component Value Date   VITAMINB12 318 11/20/2023   Lab Results  Component Value Date   TSH 1.73 11/20/2023     ASSESSMENT AND PLAN  53 y.o. year old female with past medical history of anxiety depression, insomnia, prediabetes, ADD and headaches who is presenting for migraine follow up with worsening migraines over the past few months with about 5/month.   Migraine headaches Increased frequency: Prevention: continue Emgality  monthly injection for now, consider switching to alternative CGRP in the future if needed but will continue with current treatment for now Rescue: Continue rizatriptan  as needed, can repeat after 2 hrs if needed. Continue Zofran  as needed Recommend completion of MRI brain w/wo contrast due to increased frequency of migraine headaches, has never previously had brain imaging completed      Follow-up in 1 year or call earlier if needed  I personally spent a total of 25 minutes in the care of the patient today including preparing to see the patient, performing a medically appropriate exam/evaluation, counseling and educating,  placing orders, and documenting clinical information in the EHR. This is our first time meeting and time has been spent reviewing past medical history and relevant medical records.  Harlene Bogaert, AGNP-BC  [facility] [address] [place], [place] [address]  Phone [phone] Fax [phone] Note: This document was prepared with digital dictation and possible smart phrase technology. Any transcriptional errors that result from this process are unintentional.

## 2023-12-11 ENCOUNTER — Ambulatory Visit: Payer: BC Managed Care – PPO | Admitting: Adult Health

## 2023-12-11 ENCOUNTER — Telehealth: Payer: Self-pay | Admitting: Adult Health

## 2023-12-11 ENCOUNTER — Encounter: Payer: Self-pay | Admitting: Adult Health

## 2023-12-11 VITALS — BP 150/88 | HR 74 | Ht 60.5 in | Wt 155.4 lb

## 2023-12-11 DIAGNOSIS — G43101 Migraine with aura, not intractable, with status migrainosus: Secondary | ICD-10-CM

## 2023-12-11 DIAGNOSIS — R519 Headache, unspecified: Secondary | ICD-10-CM

## 2023-12-11 MED ORDER — DIAZEPAM 5 MG PO TABS: 5.0000 mg | ORAL_TABLET | ORAL | 0 refills | Status: AC | PRN

## 2023-12-11 MED ORDER — ONDANSETRON HCL 4 MG PO TABS: 4.0000 mg | ORAL_TABLET | Freq: Three times a day (TID) | ORAL | 11 refills | Status: AC | PRN

## 2023-12-11 MED ORDER — EMGALITY 120 MG/ML ~~LOC~~ SOAJ: 120.0000 mg | SUBCUTANEOUS | 11 refills | Status: AC

## 2023-12-11 MED ORDER — RIZATRIPTAN BENZOATE 10 MG PO TBDP
ORAL_TABLET | ORAL | 11 refills | Status: AC
Start: 2023-12-11 — End: ?

## 2023-12-11 NOTE — Telephone Encounter (Signed)
 Order placed to take Valium  5 mg 30 to 60 minutes prior to MRI and can repeat x1 just prior to MRI if needed. Medication can cause sedation, drowsiness and dizziness, will need to have driver as previously advised.

## 2023-12-11 NOTE — Addendum Note (Signed)
 Addended by: WHITFIELD RAISIN L on: 12/11/2023 09:59 AM   Modules accepted: Orders

## 2023-12-11 NOTE — Patient Instructions (Addendum)
 Your Plan:  Continue current treatment regimen at this time - please call with any worsening migraine headaches   You will be called to schedule an MRI brain      Follow up in 1 year or call earlier if needed      Thank you for coming to see us  at [facility]. I hope we have been able to provide you high quality care today.  You may receive a patient satisfaction survey over the next few weeks. We would appreciate your feedback and comments so that we may continue to improve ourselves and the health of our patients.

## 2023-12-11 NOTE — Telephone Encounter (Signed)
 Pt scheduled for MRI brain w/wo contrast at [facility] for 12/13/23 at 8am. Pt is requesting medication to help with claustrophobia and pt is aware that if medication is taken that she will be a driver for appointment

## 2023-12-13 ENCOUNTER — Ambulatory Visit

## 2023-12-13 DIAGNOSIS — G43101 Migraine with aura, not intractable, with status migrainosus: Secondary | ICD-10-CM

## 2023-12-13 DIAGNOSIS — R519 Headache, unspecified: Secondary | ICD-10-CM

## 2023-12-13 MED ORDER — GADOBENATE DIMEGLUMINE 529 MG/ML IV SOLN
15.0000 mL | Freq: Once | INTRAVENOUS | Status: AC | PRN
  Administered 2023-12-13: 15 mL via INTRAVENOUS

## 2023-12-19 ENCOUNTER — Ambulatory Visit: Payer: Self-pay | Admitting: Adult Health

## 2024-02-26 ENCOUNTER — Telehealth: Payer: Self-pay | Admitting: Pharmacist

## 2024-02-26 NOTE — Telephone Encounter (Signed)
 Pharmacy Patient Advocate Encounter   Received notification from Patient Pharmacy that prior authorization for Emgality  120MG /ML auto-injectors (migraine) is required/requested.   Insurance verification completed.   The patient is insured through [facility]   Per test claim: PA required; PA submitted to above mentioned insurance via Latent Key/confirmation #/EOC [id number] Status is pending

## 2024-02-26 NOTE — Telephone Encounter (Signed)
 Pharmacy Patient Advocate Encounter  Received notification from [facility] that Prior Authorization for EMGALITY  120 MG/ML [place] SOAJ has been APPROVED from 02/26/2024 to 02/25/2025   PA #/Case ID/Reference #: [id number]

## 2024-03-27 ENCOUNTER — Other Ambulatory Visit: Payer: Self-pay | Admitting: Internal Medicine

## 2024-03-27 DIAGNOSIS — K219 Gastro-esophageal reflux disease without esophagitis: Secondary | ICD-10-CM

## 2024-12-17 ENCOUNTER — Ambulatory Visit: Admitting: Adult Health
